# Patient Record
Sex: Male | Born: 1945
Health system: Southern US, Community
[De-identification: ages and names within clinical notes are randomized; demographics above are authoritative.]

## PROBLEM LIST (undated history)

## (undated) DIAGNOSIS — R519 Headache, unspecified: Secondary | ICD-10-CM

## (undated) DIAGNOSIS — I10 Essential (primary) hypertension: Secondary | ICD-10-CM

## (undated) DIAGNOSIS — G4730 Sleep apnea, unspecified: Secondary | ICD-10-CM

## (undated) DIAGNOSIS — E785 Hyperlipidemia, unspecified: Secondary | ICD-10-CM

## (undated) DIAGNOSIS — C801 Malignant (primary) neoplasm, unspecified: Secondary | ICD-10-CM

## (undated) DIAGNOSIS — Z9109 Other allergy status, other than to drugs and biological substances: Secondary | ICD-10-CM

## (undated) DIAGNOSIS — H269 Unspecified cataract: Secondary | ICD-10-CM

## (undated) DIAGNOSIS — K219 Gastro-esophageal reflux disease without esophagitis: Secondary | ICD-10-CM

## (undated) DIAGNOSIS — Z8619 Personal history of other infectious and parasitic diseases: Secondary | ICD-10-CM

## (undated) DIAGNOSIS — Z85828 Personal history of other malignant neoplasm of skin: Secondary | ICD-10-CM

## (undated) DIAGNOSIS — M1990 Unspecified osteoarthritis, unspecified site: Secondary | ICD-10-CM

## (undated) DIAGNOSIS — R51 Headache: Secondary | ICD-10-CM

## (undated) DIAGNOSIS — G4733 Obstructive sleep apnea (adult) (pediatric): Secondary | ICD-10-CM

## (undated) DIAGNOSIS — T7840XA Allergy, unspecified, initial encounter: Secondary | ICD-10-CM

## (undated) DIAGNOSIS — K5900 Constipation, unspecified: Secondary | ICD-10-CM

## (undated) DIAGNOSIS — R351 Nocturia: Secondary | ICD-10-CM

## (undated) HISTORY — PX: EYE SURGERY: SHX253

## (undated) HISTORY — DX: Essential (primary) hypertension: I10

## (undated) HISTORY — DX: Unspecified cataract: H26.9

## (undated) HISTORY — PX: JOINT REPLACEMENT: SHX530

## (undated) HISTORY — DX: Allergy, unspecified, initial encounter: T78.40XA

## (undated) HISTORY — PX: INTERPHALANGEAL JOINT ARTHROPLASTY: SUR71

## (undated) HISTORY — DX: Personal history of other infectious and parasitic diseases: Z86.19

## (undated) HISTORY — DX: Sleep apnea, unspecified: G47.30

## (undated) HISTORY — DX: Obstructive sleep apnea (adult) (pediatric): G47.33

---

## 1998-07-28 ENCOUNTER — Ambulatory Visit: Admission: RE | Admit: 1998-07-28 | Discharge: 1998-07-28 | Payer: Self-pay | Admitting: Family Medicine

## 1998-09-01 ENCOUNTER — Ambulatory Visit: Admission: RE | Admit: 1998-09-01 | Discharge: 1998-09-01 | Payer: Self-pay | Admitting: Internal Medicine

## 2009-01-20 DIAGNOSIS — C4492 Squamous cell carcinoma of skin, unspecified: Secondary | ICD-10-CM

## 2009-01-20 HISTORY — DX: Squamous cell carcinoma of skin, unspecified: C44.92

## 2009-01-21 ENCOUNTER — Ambulatory Visit: Payer: Self-pay | Admitting: Internal Medicine

## 2009-01-21 DIAGNOSIS — G4733 Obstructive sleep apnea (adult) (pediatric): Secondary | ICD-10-CM | POA: Insufficient documentation

## 2009-01-21 DIAGNOSIS — J302 Other seasonal allergic rhinitis: Secondary | ICD-10-CM | POA: Insufficient documentation

## 2009-01-21 DIAGNOSIS — J309 Allergic rhinitis, unspecified: Secondary | ICD-10-CM | POA: Insufficient documentation

## 2010-03-19 ENCOUNTER — Encounter: Admission: RE | Admit: 2010-03-19 | Discharge: 2010-04-23 | Payer: Self-pay | Admitting: Internal Medicine

## 2013-11-30 ENCOUNTER — Ambulatory Visit (HOSPITAL_COMMUNITY): Payer: Medicare Other | Attending: Cardiovascular Disease

## 2013-11-30 ENCOUNTER — Other Ambulatory Visit (HOSPITAL_COMMUNITY): Payer: Self-pay | Admitting: Cardiology

## 2013-11-30 ENCOUNTER — Encounter (HOSPITAL_COMMUNITY): Payer: Self-pay | Admitting: Internal Medicine

## 2013-11-30 DIAGNOSIS — E785 Hyperlipidemia, unspecified: Secondary | ICD-10-CM | POA: Insufficient documentation

## 2013-11-30 DIAGNOSIS — I714 Abdominal aortic aneurysm, without rupture, unspecified: Secondary | ICD-10-CM

## 2013-11-30 DIAGNOSIS — I700 Atherosclerosis of aorta: Secondary | ICD-10-CM | POA: Insufficient documentation

## 2014-10-25 HISTORY — PX: EYE SURGERY: SHX253

## 2014-10-25 HISTORY — PX: CATARACT EXTRACTION W/ INTRAOCULAR LENS IMPLANT: SHX1309

## 2015-06-20 ENCOUNTER — Encounter: Payer: Self-pay | Admitting: Interventional Cardiology

## 2015-06-20 ENCOUNTER — Ambulatory Visit (INDEPENDENT_AMBULATORY_CARE_PROVIDER_SITE_OTHER): Payer: PPO | Admitting: Interventional Cardiology

## 2015-06-20 VITALS — BP 132/80 | HR 75 | Ht 71.0 in | Wt 249.0 lb

## 2015-06-20 DIAGNOSIS — E785 Hyperlipidemia, unspecified: Secondary | ICD-10-CM | POA: Insufficient documentation

## 2015-06-20 DIAGNOSIS — Z01818 Encounter for other preprocedural examination: Secondary | ICD-10-CM | POA: Diagnosis not present

## 2015-06-20 NOTE — Progress Notes (Signed)
Patient ID: Cody [name], male   DOB: Nov 18, 1945, 69 y.o.   MRN: [id number]     Cardiology Office Note   Date:  06/20/2015   ID:  Cody [name] [birth year], MRN [id number]  PCP:  Wenda Low, MD    No chief complaint on file. preop eval for left hip replacement   Wt Readings from Last 3 Encounters:  06/20/15 249 lb (112.946 kg)  01/21/09 246 lb 2.1 oz (111.644 kg)       History of Present Illness: Cody [name] is a 69 y.o. male  Who requires hip replacement.  He had a stress test in 2009 that was normal per his report. Records are unavailable. At that time, he had some atypical chest pain.  THese days, He is not having any chest pain.  He mows the yard with a push mower (90 minutes), walks up stairs and uses an exercise bike twice  aday without any cardiac sx.  No CP or SHOB.    No early CAD in the family.   He had recent cataract surgery without difficulty.      Past Medical History  Diagnosis Date  . History of shingles   . Sleep apnea, obstructive     cpap    Past Surgical History  Procedure Laterality Date  . Eye surgery  2016     Current Outpatient Prescriptions  Medication Sig Dispense Refill  . ALPRAZolam (XANAX) 0.25 MG tablet Take 1/2 tablet by mouth daily as needed for anxiety    . aspirin 81 MG tablet Take 81 mg by mouth daily. Do not take on days when taking ibuprofen.    Marland Kitchen atorvastatin (LIPITOR) 20 MG tablet Take 20 mg by mouth daily.    . Azelastine HCl (ASTEPRO) 0.15 % SOLN Place 1 spray into the nose daily as needed (allergy).     . fluticasone (FLONASE) 50 MCG/ACT nasal spray Place 2 sprays into both nostrils daily.    Marland Kitchen ibuprofen (ADVIL,MOTRIN) 200 MG tablet Take 200 mg by mouth every 6 (six) hours as needed. pain    . omeprazole (PRILOSEC) 20 MG capsule Take 20 mg by mouth every 3 (three) days.    . Pseudoephedrine-Ibuprofen (ADVIL COLD/SINUS PO) Take 1 capsule by mouth daily as needed (cold and sinus).      No current  facility-administered medications for this visit.    Allergies:   Penicillins    Social History:  The patient  reports that he has quit smoking. He does not have any smokeless tobacco history on file. He reports that he drinks alcohol. He reports that he does not use illicit drugs.   Family History:  The patient's family history includes Heart Problems in his mother; Heart failure in his mother; Hypertension in his sister; Other in his sister, son, and son; Stroke in his father.    ROS:  Please see the history of present illness.   Otherwise, review of systems are positive for hip pain.   All other systems are reviewed and negative.    PHYSICAL EXAM: VS:  BP 132/80 mmHg  Pulse 75  Ht 5\' 11"  (1.803 m)  Wt 249 lb (112.946 kg)  BMI 34.74 kg/m2 , BMI Body mass index is 34.74 kg/(m^2). GEN: Well nourished, well developed, in no acute distress HEENT: normal Neck: no JVD, carotid bruits, or masses Cardiac: RRR; no murmurs, rubs, or gallops,no edema  Respiratory:  clear to auscultation bilaterally, normal work of breathing GI: soft, nontender, nondistended, + BS  MS: no deformity or atrophy Skin: warm and dry, no rash Neuro:  Strength and sensation are intact Psych: euthymic mood, full affect   EKG:   The ekg ordered today demonstrates normal ECG, normal sinus rhythm, no ST segment changes   Recent Labs: No results found for requested labs within last 365 days.   Lipid Panel No results found for: CHOL, TRIG, HDL, CHOLHDL, VLDL, LDLCALC, LDLDIRECT   Other studies Reviewed: Additional studies/ records that were reviewed today with results demonstrating: Looked in the [place] record and no stress test results available.   ASSESSMENT AND PLAN:  1. Preoperative cardiovascular assessment: The patient is low to moderate risk for surgery. The surgery itself is a moderate risk procedure. Given his lack of symptoms with fairly vigorous cardiovascular activity, would not plan any further  cardiovascular testing at this time before surgery. His main risk factor is age which obviously cannot be modified. His blood pressure is well controlled.. 2. Hyperlipidemia: Continue atorvastatin. 3. Given his lack of symptoms, we'll not schedule any regular cardiac follow-up. If he has symptoms, he will let us know.   Current medicines are reviewed at length with the patient today.  The patient concerns regarding his medicines were addressed.  The following changes have been made:  No change  Labs/ tests ordered today include:   Orders Placed This Encounter  Procedures  . EKG 12-Lead    Recommend 150 minutes/week of aerobic exercise Low fat, low carb, high fiber diet recommended  Disposition:   FU as needed   Teresita Madura., MD  06/20/2015 11:01 AM    [facility] [address], [place], [place]  [address] Phone: [phone]; Fax: [phone]

## 2015-06-20 NOTE — Patient Instructions (Signed)
Medication Instructions:  Your physician recommends that you continue on your current medications as directed. Please refer to the Current Medication list given to you today.   Labwork: none  Testing/Procedures: none  Follow-Up: Your physician recommends that you schedule a follow-up appointment as needed with Dr. Varanasi   Any Other Special Instructions Will Be Listed Below (If Applicable).   

## 2015-07-16 ENCOUNTER — Ambulatory Visit: Payer: Self-pay | Admitting: Orthopedic Surgery

## 2015-08-18 ENCOUNTER — Ambulatory Visit: Payer: Self-pay | Admitting: Orthopedic Surgery

## 2015-08-18 NOTE — H&P (Signed)
TOTAL HIP ADMISSION H&P  Patient is admitted for left total hip arthroplasty.  Subjective:  Chief Complaint: left hip pain  HPI: Cody [name], 69 y.o. male, has a history of pain and functional disability in the left hip(s) due to arthritis and patient has failed non-surgical conservative treatments for greater than 12 weeks to include NSAID's and/or analgesics, flexibility and strengthening excercises, use of assistive devices, weight reduction as appropriate and activity modification.  Onset of symptoms was gradual starting >10 years ago with gradually worsening course since that time.The patient noted no past surgery on the bilaterally hip(s).  Patient currently rates pain in the left hip at 10 out of 10 with activity. Patient has night pain, worsening of pain with activity and weight bearing, pain that interfers with activities of daily living and pain with passive range of motion. Patient has evidence of subchondral cysts, subchondral sclerosis, periarticular osteophytes and joint space narrowing by imaging studies. This condition presents safety issues increasing the risk of falls.There is no current active infection.  Patient Active Problem List   Diagnosis Date Noted  . Hyperlipidemia 06/20/2015  . SLEEP APNEA, OBSTRUCTIVE 01/21/2009  . RHINOCONJUNCTIVITIS, ALLERGIC 01/21/2009   Past Medical History  Diagnosis Date  . History of shingles   . Sleep apnea, obstructive     cpap    Past Surgical History  Procedure Laterality Date  . Eye surgery  2016     (Not in a hospital admission) Allergies  Allergen Reactions  . Penicillins     unknown    Social History  Substance Use Topics  . Smoking status: Former Research scientist (life sciences)  . Smokeless tobacco: Not on file  . Alcohol Use: 0.0 oz/week    0 Standard drinks or equivalent per week    Family History  Problem Relation Age of Onset  . Heart failure Mother   . Heart Problems Mother     pacer  . Stroke Father   . Hypertension Sister    . Other Sister     healthty  . Other Son     healthy  . Other Son     healthy     Review of Systems  Constitutional: Positive for malaise/fatigue. Negative for fever, chills, weight loss and diaphoresis.  HENT: Positive for tinnitus.   Eyes: Negative.   Respiratory: Negative.   Cardiovascular: Negative.   Gastrointestinal: Positive for heartburn.  Genitourinary: Negative.   Musculoskeletal: Positive for joint pain.  Skin: Negative.   Neurological: Positive for headaches. Negative for dizziness, tingling, tremors, sensory change, speech change, focal weakness, seizures, loss of consciousness and weakness.  Endo/Heme/Allergies: Negative.   Psychiatric/Behavioral: Negative.     Objective:  Physical Exam  Constitutional: He is oriented to person, place, and time. He appears well-developed and well-nourished.  HENT:  Head: Normocephalic and atraumatic.  Eyes: Conjunctivae and EOM are normal. Pupils are equal, round, and reactive to light.  Neck: Normal range of motion. Neck supple.  Cardiovascular: Normal rate, regular rhythm, normal heart sounds and intact distal pulses.   Respiratory: Effort normal and breath sounds normal. No respiratory distress. He has no wheezes.  GI: Soft. Bowel sounds are normal. He exhibits no distension. There is no tenderness.  Genitourinary:  deferred  Musculoskeletal:       Left hip: He exhibits decreased range of motion and bony tenderness.  Neurological: He is alert and oriented to person, place, and time. He has normal reflexes.  Skin: Skin is warm and dry.  Psychiatric: He has a  normal mood and affect. His behavior is normal. Judgment and thought content normal.    Vital signs in last 24 hours: @VSRANGES @  Labs:   Estimated body mass index is 34.74 kg/(m^2) as calculated from the following:   Height as of 06/20/15: 5\' 11"  (1.803 m).   Weight as of 06/20/15: 112.946 kg (249 lb).   Imaging Review Plain radiographs demonstrate severe  degenerative joint disease of the bilateral hip(s). The bone quality appears to be adequate for age and reported activity level.  Assessment/Plan:  End stage arthritis, left hip(s)  The patient history, physical examination, clinical judgement of the provider and imaging studies are consistent with end stage degenerative joint disease of the left hip(s) and total hip arthroplasty is deemed medically necessary. The treatment options including medical management, injection therapy, arthroscopy and arthroplasty were discussed at length. The risks and benefits of total hip arthroplasty were presented and reviewed. The risks due to aseptic loosening, infection, stiffness, dislocation/subluxation,  thromboembolic complications and other imponderables were discussed.  The patient acknowledged the explanation, agreed to proceed with the plan and consent was signed. Patient is being admitted for inpatient treatment for surgery, pain control, PT, OT, prophylactic antibiotics, VTE prophylaxis, progressive ambulation and ADL's and discharge planning.The patient is planning to be discharged home with home health services

## 2015-08-22 NOTE — Patient Instructions (Addendum)
YOUR PROCEDURE IS SCHEDULED ON : 09/04/15  REPORT TO [facility] MAIN ENTRANCE FOLLOW SIGNS TO EAST ELEVATOR - GO TO 3rd FLOOR CHECK IN AT 3 EAST NURSES STATION (SHORT STAY) AT:  10:45 AM  CALL THIS NUMBER IF YOU HAVE PROBLEMS THE MORNING OF SURGERY [phone]  REMEMBER:ONLY 1 PER PERSON MAY GO TO SHORT STAY WITH YOU TO GET READY THE MORNING OF YOUR SURGERY  DO NOT EAT FOOD OR DRINK LIQUIDS AFTER MIDNIGHT  TAKE THESE MEDICINES THE MORNING OF SURGERY: OMEPRAZOLE / MAY USE FLONASE NASAL SPRAY IF NEEDED / USE EYE DROP AS DIRECTED  BRING C-PAP TUBING AND MASK TO HOSPITAL  YOU MAY NOT HAVE ANY METAL ON YOUR BODY INCLUDING HAIR PINS AND PIERCING'S. DO NOT WEAR JEWELRY, MAKEUP, LOTIONS, POWDERS OR PERFUMES. DO NOT WEAR NAIL POLISH. DO NOT SHAVE 48 HRS PRIOR TO SURGERY. MEN MAY SHAVE FACE AND NECK.  DO NOT [place]. [facility] IS NOT RESPONSIBLE FOR VALUABLES.  CONTACTS, DENTURES OR PARTIALS MAY NOT BE WORN TO SURGERY. LEAVE SUITCASE IN CAR. CAN BE BROUGHT TO ROOM AFTER SURGERY.  PATIENTS DISCHARGED THE DAY OF SURGERY WILL NOT BE ALLOWED TO DRIVE HOME.  PLEASE READ OVER THE FOLLOWING INSTRUCTION SHEETS _________________________________________________________________________________                                           - PREPARING FOR SURGERY  Before surgery, you can play an important role.  Because skin is not sterile, your skin needs to be as free of germs as possible.  You can reduce the number of germs on your skin by washing with CHG (chlorahexidine gluconate) soap before surgery.  CHG is an antiseptic cleaner which kills germs and bonds with the skin to continue killing germs even after washing. Please DO NOT use if you have an allergy to CHG or antibacterial soaps.  If your skin becomes reddened/irritated stop using the CHG and inform your nurse when you arrive at Short Stay. Do not shave (including legs and underarms) for at  least 48 hours prior to the first CHG shower.  You may shave your face. Please follow these instructions carefully:   1.  Shower with CHG Soap the night before surgery and the  morning of Surgery.   2.  If you choose to wash your hair, wash your hair first as usual with your  normal  Shampoo.   3.  After you shampoo, rinse your hair and body thoroughly to remove the  shampoo.                                         4.  Use CHG as you would any other liquid soap.  You can apply chg directly  to the skin and wash . Gently wash with scrungie or clean wascloth    5.  Apply the CHG Soap to your body ONLY FROM THE NECK DOWN.   Do not use on open                           Wound or open sores. Avoid contact with eyes, ears mouth and genitals (private parts).  Genitals (private parts) with your normal soap.              6.  Wash thoroughly, paying special attention to the area where your surgery  will be performed.   7.  Thoroughly rinse your body with warm water from the neck down.   8.  DO NOT shower/wash with your normal soap after using and rinsing off  the CHG Soap .                9.  Pat yourself dry with a clean towel.             10.  Wear clean night clothes to bed after shower             11.  Place clean sheets on your bed the night of your first shower and do not  sleep with pets.  Day of Surgery : Do not apply any lotions/deodorants the morning of surgery.  Please wear clean clothes to the hospital/surgery center.  FAILURE TO FOLLOW THESE INSTRUCTIONS MAY RESULT IN THE CANCELLATION OF YOUR SURGERY    PATIENT SIGNATURE_________________________________  ____________________________________________________________________    Cody [name]  An incentive spirometer is a tool that can help keep your lungs clear and active. This tool measures how well you are filling your lungs with each breath. Taking long deep breaths may help reverse or  decrease the chance of developing breathing (pulmonary) problems (especially infection) following:  A long period of time when you are unable to move or be active. BEFORE THE PROCEDURE   If the spirometer includes an indicator to show your best effort, your nurse or respiratory therapist will set it to a desired goal.  If possible, sit up straight or lean slightly forward. Try not to slouch.  Hold the incentive spirometer in an upright position. INSTRUCTIONS FOR USE   Sit on the edge of your bed if possible, or sit up as far as you can in bed or on a chair.  Hold the incentive spirometer in an upright position.  Breathe out normally.  Place the mouthpiece in your mouth and seal your lips tightly around it.  Breathe in slowly and as deeply as possible, raising the piston or the ball toward the top of the column.  Hold your breath for 3-5 seconds or for as long as possible. Allow the piston or ball to fall to the bottom of the column.  Remove the mouthpiece from your mouth and breathe out normally.  Rest for a few seconds and repeat Steps 1 through 7 at least 10 times every 1-2 hours when you are awake. Take your time and take a few normal breaths between deep breaths.  The spirometer may include an indicator to show your best effort. Use the indicator as a goal to work toward during each repetition.  After each set of 10 deep breaths, practice coughing to be sure your lungs are clear. If you have an incision (the cut made at the time of surgery), support your incision when coughing by placing a pillow or rolled up towels firmly against it. Once you are able to get out of bed, walk around indoors and cough well. You may stop using the incentive spirometer when instructed by your caregiver.  RISKS AND COMPLICATIONS  Take your time so you do not get dizzy or light-headed.  If you are in pain, you may need to take or ask for pain medication before doing incentive spirometry. It is  harder to take a deep breath if you are having pain. AFTER USE  Rest and breathe slowly and easily.  It can be helpful to keep track of a log of your progress. Your caregiver can provide you with a simple table to help with this. If you are using the spirometer at home, follow these instructions: [place] IF:   You are having difficultly using the spirometer.  You have trouble using the spirometer as often as instructed.  Your pain medication is not giving enough relief while using the spirometer.  You develop fever of 100.5 F (38.1 C) or higher. SEEK IMMEDIATE MEDICAL CARE IF:   You cough up bloody sputum that had not been present before.  You develop fever of 102 F (38.9 C) or greater.  You develop worsening pain at or near the incision site. MAKE SURE YOU:   Understand these instructions.  Will watch your condition.  Will get help right away if you are not doing well or get worse. Document Released: 02/21/2007 Document Revised: 01/03/2012 Document Reviewed: 04/24/2007 Exit[employer] Patient Information 2014 [employer], [place].   ________________________________________________________________________  WHAT IS A BLOOD TRANSFUSION? Blood Transfusion Information  A transfusion is the replacement of blood or some of its parts. Blood is made up of multiple cells which provide different functions.  Red blood cells carry oxygen and are used for blood loss replacement.  White blood cells fight against infection.  Platelets control bleeding.  Plasma helps clot blood.  Other blood products are available for specialized needs, such as hemophilia or other clotting disorders. BEFORE THE TRANSFUSION  Who gives blood for transfusions?   Healthy volunteers who are fully evaluated to make sure their blood is safe. This is blood bank blood. Transfusion therapy is the safest it has ever been in the practice of medicine. Before blood is taken from a donor, a complete history  is taken to make sure that person has no history of diseases nor engages in risky social behavior (examples are intravenous drug use or sexual activity with multiple partners). The donor's travel history is screened to minimize risk of transmitting infections, such as malaria. The donated blood is tested for signs of infectious diseases, such as HIV and hepatitis. The blood is then tested to be sure it is compatible with you in order to minimize the chance of a transfusion reaction. If you or a relative donates blood, this is often done in anticipation of surgery and is not appropriate for emergency situations. It takes many days to process the donated blood. RISKS AND COMPLICATIONS Although transfusion therapy is very safe and saves many lives, the main dangers of transfusion include:   Getting an infectious disease.  Developing a transfusion reaction. This is an allergic reaction to something in the blood you were given. Every precaution is taken to prevent this. The decision to have a blood transfusion has been considered carefully by your caregiver before blood is given. Blood is not given unless the benefits outweigh the risks. AFTER THE TRANSFUSION  Right after receiving a blood transfusion, you will usually feel much better and more energetic. This is especially true if your red blood cells have gotten low (anemic). The transfusion raises the level of the red blood cells which carry oxygen, and this usually causes an energy increase.  The nurse administering the transfusion will monitor you carefully for complications. HOME CARE INSTRUCTIONS  No special instructions are needed after a transfusion. You may find your energy is better. Speak with  your caregiver about any limitations on activity for underlying diseases you may have. SEEK MEDICAL CARE IF:   Your condition is not improving after your transfusion.  You develop redness or irritation at the intravenous (IV) site. SEEK IMMEDIATE  MEDICAL CARE IF:  Any of the following symptoms occur over the next 12 hours:  Shaking chills.  You have a temperature by mouth above 102 F (38.9 C), not controlled by medicine.  Chest, back, or muscle pain.  People around you feel you are not acting correctly or are confused.  Shortness of breath or difficulty breathing.  Dizziness and fainting.  You get a rash or develop hives.  You have a decrease in urine output.  Your urine turns a dark color or changes to pink, red, or brown. Any of the following symptoms occur over the next 10 days:  You have a temperature by mouth above 102 F (38.9 C), not controlled by medicine.  Shortness of breath.  Weakness after normal activity.  The white part of the eye turns yellow (jaundice).  You have a decrease in the amount of urine or are urinating less often.  Your urine turns a dark color or changes to pink, red, or brown. Document Released: 10/08/2000 Document Revised: 01/03/2012 Document Reviewed: 05/27/2008 [facility] Patient Information 2014 [place], [place].  _______________________________________________________________________

## 2015-08-25 ENCOUNTER — Encounter (HOSPITAL_COMMUNITY): Payer: Self-pay

## 2015-08-25 ENCOUNTER — Encounter (HOSPITAL_COMMUNITY)
Admission: RE | Admit: 2015-08-25 | Discharge: 2015-08-25 | Disposition: A | Discharge status: Home / Self Care | Payer: PPO | Source: Ambulatory Visit | Attending: Orthopedic Surgery | Admitting: Orthopedic Surgery

## 2015-08-25 DIAGNOSIS — Z01818 Encounter for other preprocedural examination: Secondary | ICD-10-CM | POA: Diagnosis not present

## 2015-08-25 DIAGNOSIS — M1612 Unilateral primary osteoarthritis, left hip: Secondary | ICD-10-CM | POA: Diagnosis not present

## 2015-08-25 HISTORY — DX: Constipation, unspecified: K59.00

## 2015-08-25 HISTORY — DX: Other allergy status, other than to drugs and biological substances: Z91.09

## 2015-08-25 HISTORY — DX: Hyperlipidemia, unspecified: E78.5

## 2015-08-25 HISTORY — DX: Unspecified osteoarthritis, unspecified site: M19.90

## 2015-08-25 HISTORY — DX: Nocturia: R35.1

## 2015-08-25 HISTORY — DX: Gastro-esophageal reflux disease without esophagitis: K21.9

## 2015-08-25 HISTORY — DX: Personal history of other malignant neoplasm of skin: Z85.828

## 2015-08-25 HISTORY — DX: Headache, unspecified: R51.9

## 2015-08-25 HISTORY — DX: Headache: R51

## 2015-08-25 LAB — COMPREHENSIVE METABOLIC PANEL
ALBUMIN: 4.7 g/dL (ref 3.5–5.0)
ALT: 22 U/L (ref 17–63)
AST: 24 U/L (ref 15–41)
Alkaline Phosphatase: 72 U/L (ref 38–126)
Anion gap: 8 (ref 5–15)
BUN: 17 mg/dL (ref 6–20)
CO2: 25 mmol/L (ref 22–32)
Calcium: 9.8 mg/dL (ref 8.9–10.3)
Chloride: 104 mmol/L (ref 101–111)
Creatinine, Ser: 1.21 mg/dL (ref 0.61–1.24)
GFR calc Af Amer: >60 mL/min (ref >60)
GFR calc non Af Amer: >60 mL/min (ref >60)
GLUCOSE: 107 mg/dL — AB (ref 65–99)
POTASSIUM: 4 mmol/L (ref 3.5–5.1)
SODIUM: 137 mmol/L (ref 135–145)
TOTAL PROTEIN: 7.8 g/dL (ref 6.5–8.1)
Total Bilirubin: 1.2 mg/dL (ref 0.3–1.2)

## 2015-08-25 LAB — URINALYSIS, ROUTINE W REFLEX MICROSCOPIC
Bilirubin Urine: NEGATIVE
GLUCOSE, UA: NEGATIVE mg/dL
KETONES UR: NEGATIVE mg/dL
Leukocytes, UA: NEGATIVE
Nitrite: NEGATIVE
PROTEIN: NEGATIVE mg/dL
Specific Gravity, Urine: 1.013 (ref 1.005–1.030)
Urobilinogen, UA: 0.2 mg/dL (ref 0.0–1.0)
pH: 5.5 (ref 5.0–8.0)

## 2015-08-25 LAB — PROTIME-INR
INR: 1.06 (ref 0.00–1.49)
Prothrombin Time: 14 seconds (ref 11.6–15.2)

## 2015-08-25 LAB — CBC
HCT: 45.9 % (ref 39.0–52.0)
Hemoglobin: 16 g/dL (ref 13.0–17.0)
MCH: 29.9 pg (ref 26.0–34.0)
MCHC: 34.9 g/dL (ref 30.0–36.0)
MCV: 85.6 fL (ref 78.0–100.0)
PLATELETS: 211 10*3/uL (ref 150–400)
RBC: 5.36 MIL/uL (ref 4.22–5.81)
RDW: 13.2 % (ref 11.5–15.5)
WBC: 6.7 10*3/uL (ref 4.0–10.5)

## 2015-08-25 LAB — ABO/RH: ABO/RH(D): B POS

## 2015-08-25 LAB — APTT: APTT: 29 s (ref 24–37)

## 2015-08-25 LAB — URINE MICROSCOPIC-ADD ON

## 2015-08-25 LAB — SURGICAL PCR SCREEN
MRSA, PCR: INVALID — AB
STAPHYLOCOCCUS AUREUS: INVALID — AB

## 2015-08-27 LAB — MRSA CULTURE

## 2015-09-04 ENCOUNTER — Inpatient Hospital Stay (HOSPITAL_COMMUNITY)
Admission: RE | Admit: 2015-09-04 | Discharge: 2015-09-06 | DRG: 470 | Disposition: A | Discharge status: Home Health | Payer: PPO | Source: Ambulatory Visit | Attending: Orthopedic Surgery | Admitting: Orthopedic Surgery

## 2015-09-04 ENCOUNTER — Inpatient Hospital Stay (HOSPITAL_COMMUNITY): Payer: PPO | Admitting: Registered Nurse

## 2015-09-04 ENCOUNTER — Encounter (HOSPITAL_COMMUNITY)
Admission: RE | Disposition: A | Discharge status: Home Health | Payer: Self-pay | Source: Ambulatory Visit | Attending: Orthopedic Surgery

## 2015-09-04 ENCOUNTER — Encounter (HOSPITAL_COMMUNITY): Payer: Self-pay | Admitting: Registered Nurse

## 2015-09-04 ENCOUNTER — Inpatient Hospital Stay (HOSPITAL_COMMUNITY): Payer: PPO

## 2015-09-04 DIAGNOSIS — Z419 Encounter for procedure for purposes other than remedying health state, unspecified: Secondary | ICD-10-CM

## 2015-09-04 DIAGNOSIS — E785 Hyperlipidemia, unspecified: Secondary | ICD-10-CM | POA: Diagnosis present

## 2015-09-04 DIAGNOSIS — G4733 Obstructive sleep apnea (adult) (pediatric): Secondary | ICD-10-CM | POA: Diagnosis present

## 2015-09-04 DIAGNOSIS — M1612 Unilateral primary osteoarthritis, left hip: Principal | ICD-10-CM | POA: Diagnosis present

## 2015-09-04 DIAGNOSIS — Z79899 Other long term (current) drug therapy: Secondary | ICD-10-CM

## 2015-09-04 DIAGNOSIS — M25552 Pain in left hip: Secondary | ICD-10-CM | POA: Diagnosis present

## 2015-09-04 DIAGNOSIS — Z09 Encounter for follow-up examination after completed treatment for conditions other than malignant neoplasm: Secondary | ICD-10-CM

## 2015-09-04 DIAGNOSIS — K219 Gastro-esophageal reflux disease without esophagitis: Secondary | ICD-10-CM | POA: Diagnosis present

## 2015-09-04 DIAGNOSIS — Z01812 Encounter for preprocedural laboratory examination: Secondary | ICD-10-CM

## 2015-09-04 DIAGNOSIS — Z7982 Long term (current) use of aspirin: Secondary | ICD-10-CM

## 2015-09-04 DIAGNOSIS — Z87891 Personal history of nicotine dependence: Secondary | ICD-10-CM

## 2015-09-04 DIAGNOSIS — Z8619 Personal history of other infectious and parasitic diseases: Secondary | ICD-10-CM

## 2015-09-04 DIAGNOSIS — Z85828 Personal history of other malignant neoplasm of skin: Secondary | ICD-10-CM | POA: Diagnosis not present

## 2015-09-04 HISTORY — PX: TOTAL HIP ARTHROPLASTY: SHX124

## 2015-09-04 LAB — TYPE AND SCREEN
ABO/RH(D): B POS
ANTIBODY SCREEN: NEGATIVE

## 2015-09-04 SURGERY — ARTHROPLASTY, HIP, TOTAL, ANTERIOR APPROACH
Anesthesia: Spinal | Site: Hip | Laterality: Left

## 2015-09-04 MED ORDER — KETOROLAC TROMETHAMINE 30 MG/ML IJ SOLN
INTRAMUSCULAR | Status: AC
  Filled 2015-09-04: qty 1

## 2015-09-04 MED ORDER — CEFAZOLIN SODIUM-DEXTROSE 2-3 GM-% IV SOLR
INTRAVENOUS | Status: AC
  Filled 2015-09-04: qty 50

## 2015-09-04 MED ORDER — CEFAZOLIN SODIUM-DEXTROSE 2-3 GM-% IV SOLR
2.0000 g | Freq: Four times a day (QID) | INTRAVENOUS | Status: AC
  Administered 2015-09-04 – 2015-09-05 (×2): 2 g via INTRAVENOUS
  Filled 2015-09-04 (×2): qty 50

## 2015-09-04 MED ORDER — LACTATED RINGERS IV SOLN
INTRAVENOUS | Status: DC
  Administered 2015-09-04: 15:00:00 via INTRAVENOUS
  Administered 2015-09-04: 1000 mL via INTRAVENOUS

## 2015-09-04 MED ORDER — FENTANYL CITRATE (PF) 100 MCG/2ML IJ SOLN
INTRAMUSCULAR | Status: DC | PRN
  Administered 2015-09-04: 100 ug via INTRAVENOUS

## 2015-09-04 MED ORDER — ACETAMINOPHEN 650 MG RE SUPP: 650.0000 mg | Freq: Four times a day (QID) | RECTAL | Status: DC | PRN

## 2015-09-04 MED ORDER — SODIUM CHLORIDE 0.9 % IV SOLN
INTRAVENOUS | Status: DC
  Administered 2015-09-04: 20:00:00 via INTRAVENOUS
  Administered 2015-09-04: 1000 mL via INTRAVENOUS
  Administered 2015-09-05: 04:00:00 via INTRAVENOUS

## 2015-09-04 MED ORDER — BUPIVACAINE-EPINEPHRINE (PF) 0.25% -1:200000 IJ SOLN
INTRAMUSCULAR | Status: AC
  Filled 2015-09-04: qty 30

## 2015-09-04 MED ORDER — HYDROGEN PEROXIDE 3 % EX SOLN
CUTANEOUS | Status: DC | PRN
  Administered 2015-09-04: 1

## 2015-09-04 MED ORDER — ONDANSETRON HCL 4 MG/2ML IJ SOLN
4.0000 mg | Freq: Four times a day (QID) | INTRAMUSCULAR | Status: DC | PRN
  Administered 2015-09-05: 4 mg via INTRAVENOUS
  Filled 2015-09-04: qty 2

## 2015-09-04 MED ORDER — LIDOCAINE HCL (CARDIAC) 20 MG/ML IV SOLN
INTRAVENOUS | Status: DC | PRN
  Administered 2015-09-04: 50 mg via INTRAVENOUS

## 2015-09-04 MED ORDER — PANTOPRAZOLE SODIUM 40 MG PO TBEC
40.0000 mg | DELAYED_RELEASE_TABLET | Freq: Every day | ORAL | Status: DC
  Administered 2015-09-05 – 2015-09-06 (×2): 40 mg via ORAL
  Filled 2015-09-04 (×2): qty 1

## 2015-09-04 MED ORDER — METOCLOPRAMIDE HCL 5 MG/ML IJ SOLN: 5.0000 mg | Freq: Three times a day (TID) | INTRAMUSCULAR | Status: DC | PRN

## 2015-09-04 MED ORDER — KETOROLAC TROMETHAMINE 30 MG/ML IJ SOLN
INTRAMUSCULAR | Status: DC | PRN
  Administered 2015-09-04: 30 mg

## 2015-09-04 MED ORDER — FENTANYL CITRATE (PF) 100 MCG/2ML IJ SOLN
INTRAMUSCULAR | Status: AC
  Filled 2015-09-04: qty 2

## 2015-09-04 MED ORDER — FENTANYL CITRATE (PF) 100 MCG/2ML IJ SOLN
25.0000 ug | INTRAMUSCULAR | Status: DC | PRN
  Administered 2015-09-04: 50 ug via INTRAVENOUS

## 2015-09-04 MED ORDER — WATER FOR IRRIGATION, STERILE IR SOLN
Status: DC | PRN
  Administered 2015-09-04: 1000 mL

## 2015-09-04 MED ORDER — CHLORHEXIDINE GLUCONATE 4 % EX LIQD: 60.0000 mL | Freq: Once | CUTANEOUS | Status: DC

## 2015-09-04 MED ORDER — DOCUSATE SODIUM 100 MG PO CAPS
100.0000 mg | ORAL_CAPSULE | Freq: Two times a day (BID) | ORAL | Status: DC
  Administered 2015-09-04 – 2015-09-06 (×4): 100 mg via ORAL

## 2015-09-04 MED ORDER — HYDROCODONE-ACETAMINOPHEN 5-325 MG PO TABS
1.0000 | ORAL_TABLET | ORAL | Status: DC | PRN
  Administered 2015-09-04 (×2): 2 via ORAL
  Administered 2015-09-05: 1 via ORAL
  Administered 2015-09-05 (×3): 2 via ORAL
  Administered 2015-09-05 – 2015-09-06 (×3): 1 via ORAL
  Filled 2015-09-04 (×3): qty 1
  Filled 2015-09-04 (×3): qty 2
  Filled 2015-09-04: qty 1
  Filled 2015-09-04 (×2): qty 2

## 2015-09-04 MED ORDER — PROPOFOL 10 MG/ML IV BOLUS
INTRAVENOUS | Status: AC
  Filled 2015-09-04: qty 20

## 2015-09-04 MED ORDER — MENTHOL 3 MG MT LOZG: 1.0000 | LOZENGE | OROMUCOSAL | Status: DC | PRN

## 2015-09-04 MED ORDER — SODIUM CHLORIDE 0.9 % IJ SOLN
INTRAMUSCULAR | Status: DC | PRN
  Administered 2015-09-04: 30 mL

## 2015-09-04 MED ORDER — ONDANSETRON HCL 4 MG/2ML IJ SOLN
INTRAMUSCULAR | Status: AC
  Filled 2015-09-04: qty 2

## 2015-09-04 MED ORDER — POVIDONE-IODINE 10 % EX SOLN
CUTANEOUS | Status: DC | PRN
  Administered 2015-09-04: 1 via TOPICAL

## 2015-09-04 MED ORDER — MIDAZOLAM HCL 2 MG/2ML IJ SOLN
INTRAMUSCULAR | Status: AC
  Filled 2015-09-04: qty 4

## 2015-09-04 MED ORDER — DEXAMETHASONE SODIUM PHOSPHATE 10 MG/ML IJ SOLN
10.0000 mg | Freq: Once | INTRAMUSCULAR | Status: AC
  Administered 2015-09-05: 10 mg via INTRAVENOUS
  Filled 2015-09-04: qty 1

## 2015-09-04 MED ORDER — PHENOL 1.4 % MT LIQD: 1.0000 | OROMUCOSAL | Status: DC | PRN

## 2015-09-04 MED ORDER — KETOROLAC TROMETHAMINE 0.5 % OP SOLN
1.0000 [drp] | Freq: Four times a day (QID) | OPHTHALMIC | Status: DC
  Administered 2015-09-04 – 2015-09-06 (×6): 1 [drp] via OPHTHALMIC
  Filled 2015-09-04: qty 3

## 2015-09-04 MED ORDER — GLYCOPYRROLATE 0.2 MG/ML IJ SOLN
INTRAMUSCULAR | Status: DC | PRN
  Administered 2015-09-04: 0.2 mg via INTRAVENOUS

## 2015-09-04 MED ORDER — ASPIRIN EC 325 MG PO TBEC
325.0000 mg | DELAYED_RELEASE_TABLET | Freq: Two times a day (BID) | ORAL | Status: DC
  Administered 2015-09-05 – 2015-09-06 (×3): 325 mg via ORAL
  Filled 2015-09-04 (×5): qty 1

## 2015-09-04 MED ORDER — OFLOXACIN 0.3 % OP SOLN
1.0000 [drp] | Freq: Four times a day (QID) | OPHTHALMIC | Status: DC
  Administered 2015-09-04 – 2015-09-06 (×7): 1 [drp] via OPHTHALMIC
  Filled 2015-09-04: qty 5

## 2015-09-04 MED ORDER — SODIUM CHLORIDE 0.9 % IJ SOLN
INTRAMUSCULAR | Status: AC
  Filled 2015-09-04: qty 50

## 2015-09-04 MED ORDER — FENTANYL CITRATE (PF) 100 MCG/2ML IJ SOLN
INTRAMUSCULAR | Status: AC
Start: 2015-09-04 — End: 2015-09-04
  Filled 2015-09-04: qty 4

## 2015-09-04 MED ORDER — HYDROMORPHONE HCL 1 MG/ML IJ SOLN
0.5000 mg | INTRAMUSCULAR | Status: DC | PRN
Start: 2015-09-04 — End: 2015-09-06

## 2015-09-04 MED ORDER — HYDROGEN PEROXIDE 3 % EX SOLN
CUTANEOUS | Status: AC
  Filled 2015-09-04: qty 473

## 2015-09-04 MED ORDER — BUPIVACAINE-EPINEPHRINE 0.25% -1:200000 IJ SOLN
INTRAMUSCULAR | Status: DC | PRN
  Administered 2015-09-04: 30 mL

## 2015-09-04 MED ORDER — PROPOFOL 10 MG/ML IV BOLUS
INTRAVENOUS | Status: DC | PRN
  Administered 2015-09-04 (×2): 30 mg via INTRAVENOUS

## 2015-09-04 MED ORDER — ONDANSETRON HCL 4 MG/2ML IJ SOLN
INTRAMUSCULAR | Status: DC | PRN
  Administered 2015-09-04: 4 mg via INTRAVENOUS

## 2015-09-04 MED ORDER — SODIUM CHLORIDE 0.9 % IV SOLN: INTRAVENOUS | Status: DC

## 2015-09-04 MED ORDER — METHOCARBAMOL 1000 MG/10ML IJ SOLN
500.0000 mg | Freq: Four times a day (QID) | INTRAVENOUS | Status: DC | PRN
  Administered 2015-09-04: 500 mg via INTRAVENOUS
  Filled 2015-09-04 (×2): qty 5

## 2015-09-04 MED ORDER — PROMETHAZINE HCL 25 MG/ML IJ SOLN: 6.2500 mg | INTRAMUSCULAR | Status: DC | PRN

## 2015-09-04 MED ORDER — PROPOFOL 500 MG/50ML IV EMUL
INTRAVENOUS | Status: DC | PRN
  Administered 2015-09-04: 1 ug/kg/min via INTRAVENOUS

## 2015-09-04 MED ORDER — TRANEXAMIC ACID 1000 MG/10ML IV SOLN
1000.0000 mg | INTRAVENOUS | Status: AC
  Administered 2015-09-04: 1000 mg via INTRAVENOUS
  Filled 2015-09-04: qty 10

## 2015-09-04 MED ORDER — CEFAZOLIN SODIUM-DEXTROSE 2-3 GM-% IV SOLR
2.0000 g | INTRAVENOUS | Status: AC
  Administered 2015-09-04: 2 g via INTRAVENOUS

## 2015-09-04 MED ORDER — ATORVASTATIN CALCIUM 20 MG PO TABS
20.0000 mg | ORAL_TABLET | Freq: Every day | ORAL | Status: DC
  Administered 2015-09-04 – 2015-09-05 (×2): 20 mg via ORAL
  Filled 2015-09-04 (×3): qty 1

## 2015-09-04 MED ORDER — SODIUM CHLORIDE 0.9 % IR SOLN
Status: DC | PRN
  Administered 2015-09-04: 3000 mL

## 2015-09-04 MED ORDER — METOCLOPRAMIDE HCL 10 MG PO TABS: 5.0000 mg | ORAL_TABLET | Freq: Three times a day (TID) | ORAL | Status: DC | PRN

## 2015-09-04 MED ORDER — ONDANSETRON HCL 4 MG PO TABS: 4.0000 mg | ORAL_TABLET | Freq: Four times a day (QID) | ORAL | Status: DC | PRN

## 2015-09-04 MED ORDER — KETOROLAC TROMETHAMINE 15 MG/ML IJ SOLN
INTRAMUSCULAR | Status: AC
  Filled 2015-09-04: qty 1

## 2015-09-04 MED ORDER — DEXAMETHASONE SODIUM PHOSPHATE 10 MG/ML IJ SOLN
INTRAMUSCULAR | Status: AC
  Filled 2015-09-04: qty 1

## 2015-09-04 MED ORDER — KETOROLAC TROMETHAMINE 15 MG/ML IJ SOLN
7.5000 mg | Freq: Four times a day (QID) | INTRAMUSCULAR | Status: AC
  Administered 2015-09-04 – 2015-09-05 (×4): 7.5 mg via INTRAVENOUS
  Filled 2015-09-04 (×4): qty 1

## 2015-09-04 MED ORDER — METHOCARBAMOL 500 MG PO TABS: 500.0000 mg | ORAL_TABLET | Freq: Four times a day (QID) | ORAL | Status: DC | PRN

## 2015-09-04 MED ORDER — ISOPROPYL ALCOHOL 70 % SOLN
Status: DC | PRN
  Administered 2015-09-04: 1 via TOPICAL

## 2015-09-04 MED ORDER — ISOPROPYL ALCOHOL 70 % SOLN
Status: AC
  Filled 2015-09-04: qty 480

## 2015-09-04 MED ORDER — ACETAMINOPHEN 10 MG/ML IV SOLN
INTRAVENOUS | Status: AC
  Filled 2015-09-04: qty 100

## 2015-09-04 MED ORDER — SENNA 8.6 MG PO TABS
2.0000 | ORAL_TABLET | Freq: Every day | ORAL | Status: DC
  Administered 2015-09-04 – 2015-09-05 (×2): 17.2 mg via ORAL

## 2015-09-04 MED ORDER — DIFLUPREDNATE 0.05 % OP EMUL
1.0000 [drp] | Freq: Four times a day (QID) | OPHTHALMIC | Status: DC
  Administered 2015-09-05 – 2015-09-06 (×3): 1 [drp] via OPHTHALMIC

## 2015-09-04 MED ORDER — BUPIVACAINE HCL (PF) 0.5 % IJ SOLN
INTRAMUSCULAR | Status: DC | PRN
  Administered 2015-09-04: 3 mL via INTRATHECAL

## 2015-09-04 MED ORDER — MIDAZOLAM HCL 5 MG/5ML IJ SOLN
INTRAMUSCULAR | Status: DC | PRN
  Administered 2015-09-04: 2 mg via INTRAVENOUS

## 2015-09-04 MED ORDER — ONDANSETRON HCL 4 MG/2ML IJ SOLN
INTRAMUSCULAR | Status: AC
Start: 2015-09-04 — End: 2015-09-04
  Filled 2015-09-04: qty 2

## 2015-09-04 MED ORDER — ACETAMINOPHEN 325 MG PO TABS: 650.0000 mg | ORAL_TABLET | Freq: Four times a day (QID) | ORAL | Status: DC | PRN

## 2015-09-04 SURGICAL SUPPLY — 40 items
BAG DECANTER FOR FLEXI CONT (MISCELLANEOUS) IMPLANT
BAG SPEC THK2 15X12 ZIP CLS (MISCELLANEOUS) ×1
BAG ZIPLOCK 12X15 (MISCELLANEOUS) ×2 IMPLANT
CAPT HIP TOTAL 2 ×2 IMPLANT
CHLORAPREP W/TINT 26ML (MISCELLANEOUS) ×3 IMPLANT
COVER PERINEAL POST (MISCELLANEOUS) ×3 IMPLANT
DECANTER SPIKE VIAL GLASS SM (MISCELLANEOUS) ×3 IMPLANT
DRAPE LG THREE QUARTER DISP (DRAPES) ×8 IMPLANT
DRAPE STERI IOBAN 125X83 (DRAPES) ×3 IMPLANT
DRAPE U-SHAPE 47X51 STRL (DRAPES) ×6 IMPLANT
DRSG AQUACEL AG ADV 3.5X10 (GAUZE/BANDAGES/DRESSINGS) ×3 IMPLANT
ELECT REM PT RETURN 15FT ADLT (MISCELLANEOUS) ×3 IMPLANT
GAUZE SPONGE 4X4 12PLY STRL (GAUZE/BANDAGES/DRESSINGS) ×3 IMPLANT
GLOVE BIO SURGEON STRL SZ8.5 (GLOVE) ×6 IMPLANT
GLOVE BIOGEL PI IND STRL 8.5 (GLOVE) ×1 IMPLANT
GLOVE BIOGEL PI INDICATOR 8.5 (GLOVE) ×2
GOWN SPEC L3 XXLG W/TWL (GOWN DISPOSABLE) ×6 IMPLANT
HANDPIECE INTERPULSE COAX TIP (DISPOSABLE) ×3
HOLDER FOLEY CATH W/STRAP (MISCELLANEOUS) ×3 IMPLANT
HOOD PEEL AWAY FACE SHEILD DIS (HOOD) ×6 IMPLANT
LIQUID BAND (GAUZE/BANDAGES/DRESSINGS) ×4 IMPLANT
NDL SPNL 18GX3.5 QUINCKE PK (NEEDLE) ×1 IMPLANT
NEEDLE SPNL 18GX3.5 QUINCKE PK (NEEDLE) ×3 IMPLANT
PACK ANTERIOR HIP CUSTOM (KITS) ×3 IMPLANT
SAW OSC TIP CART 19.5X105X1.3 (SAW) ×3 IMPLANT
SEALER BIPOLAR AQUA 6.0 (INSTRUMENTS) ×3 IMPLANT
SET HNDPC FAN SPRY TIP SCT (DISPOSABLE) ×1 IMPLANT
SOL PREP POV-IOD 4OZ 10% (MISCELLANEOUS) ×3 IMPLANT
SUT ETHIBOND NAB CT1 #1 30IN (SUTURE) ×6 IMPLANT
SUT MNCRL AB 3-0 PS2 18 (SUTURE) ×3 IMPLANT
SUT MON AB 2-0 CT1 36 (SUTURE) ×6 IMPLANT
SUT VIC AB 1 CT1 36 (SUTURE) ×3 IMPLANT
SUT VIC AB 2-0 CT1 27 (SUTURE) ×3
SUT VIC AB 2-0 CT1 TAPERPNT 27 (SUTURE) ×1 IMPLANT
SUT VLOC 180 0 24IN GS25 (SUTURE) ×3 IMPLANT
SYR 50ML LL SCALE MARK (SYRINGE) ×3 IMPLANT
TRAY FOLEY W/METER SILVER 14FR (SET/KITS/TRAYS/PACK) ×1 IMPLANT
TRAY FOLEY W/METER SILVER 16FR (SET/KITS/TRAYS/PACK) ×3 IMPLANT
WATER STERILE IRR 1500ML POUR (IV SOLUTION) ×3 IMPLANT
YANKAUER SUCT BULB TIP 10FT TU (MISCELLANEOUS) ×3 IMPLANT

## 2015-09-04 NOTE — Progress Notes (Signed)
Pt stated he is not ready for bed yet, but asked that I setup cpap at home pressure of 11cm h2o.  Pt brought in his tubing and nasal pillows from home, and stated he will place the cpap on himself when ready for bed.  Pt stated he has wore it for many years and feels comfortable doing this himself.  Sterile water added to humidifier.  Pt was advised that RT is available all night should he need further assistance.

## 2015-09-04 NOTE — Discharge Summary (Signed)
Physician Discharge Summary  Patient ID: Cody [name] MRN: [id number] DOB/AGE: 1945/12/21 69 y.o.  Admit date: 09/04/2015 Discharge date: 09/06/2015  Admission Diagnoses:  Osteoarthritis of left hip  Discharge Diagnoses:  Principal Problem:   Osteoarthritis of left hip   Past Medical History  Diagnosis Date  . History of shingles   . Sleep apnea, obstructive     cpap  . Hyperlipidemia   . Environmental allergies   . Headache     "bad sinus headache"  . Arthritis   . History of skin cancer   . GERD (gastroesophageal reflux disease)   . Nocturia   . Constipation   . Anxiety     Surgeries: Procedure(s): LEFT TOTAL HIP ARTHROPLASTY ANTERIOR APPROACH on 09/04/2015   Consultants (if any):    Discharged Condition: Improved  Hospital Course: Cody [name] is an 69 y.o. male who was admitted 09/04/2015 with a diagnosis of Osteoarthritis of left hip and went to the operating room on 09/04/2015 and underwent the above named procedures.    He was given perioperative antibiotics:      Anti-infectives    Start     Dose/Rate Route Frequency Ordered Stop   09/04/15 2000  ceFAZolin (ANCEF) IVPB 2 g/50 mL premix     2 g 100 mL/hr over 30 Minutes Intravenous Every 6 hours 09/04/15 1747 09/05/15 0312   09/04/15 1114  ceFAZolin (ANCEF) IVPB 2 g/50 mL premix     2 g 100 mL/hr over 30 Minutes Intravenous On call to O.R. 09/04/15 1114 09/04/15 1315    .  He was given sequential compression devices, early ambulation, and ASA for DVT prophylaxis.  He benefited maximally from the hospital stay and there were no complications.    Recent vital signs:  Filed Vitals:   09/06/15 0522  BP: 140/67  Pulse: 76  Temp: 97.9 F (36.6 C)  Resp: 16    Recent laboratory studies:  Lab Results  Component Value Date   HGB 11.7* 09/06/2015   HGB 11.5* 09/05/2015   HGB 16.0 08/25/2015   Lab Results  Component Value Date   WBC 12.1* 09/06/2015   PLT 188 09/06/2015   Lab  Results  Component Value Date   INR 1.06 08/25/2015   Lab Results  Component Value Date   NA 136 09/05/2015   K 3.7 09/05/2015   CL 108 09/05/2015   CO2 25 09/05/2015   BUN 14 09/05/2015   CREATININE 1.11 09/05/2015   GLUCOSE 111* 09/05/2015    Discharge Medications:     Medication List    STOP taking these medications        aspirin 81 MG tablet  Replaced by:  aspirin EC 325 MG tablet     azithromycin 250 MG tablet  Commonly known as:  ZITHROMAX     ibuprofen 200 MG tablet  Commonly known as:  ADVIL,MOTRIN      TAKE these medications        ADVIL COLD/SINUS PO  Take 1 capsule by mouth every 8 (eight) hours as needed (cold and sinus).     ALPRAZolam 0.25 MG tablet  Commonly known as:  XANAX  Take 1/2 tablet by mouth daily as needed for anxiety     aspirin EC 325 MG tablet  Take 1 tablet (325 mg total) by mouth 2 (two) times daily after a meal.     ASTEPRO 0.15 % Soln  Generic drug:  Azelastine HCl  Place 1 spray into the nose daily as  needed (allergy).     atorvastatin 20 MG tablet  Commonly known as:  LIPITOR  Take 20 mg by mouth daily at 6 PM.     docusate sodium 100 MG capsule  Commonly known as:  COLACE  Take 1 capsule (100 mg total) by mouth 2 (two) times daily.     DUREZOL 0.05 % Emul  Generic drug:  Difluprednate  Apply 1 drop to eye every 6 (six) hours.     fluticasone 50 MCG/ACT nasal spray  Commonly known as:  FLONASE  Place 2 sprays into both nostrils daily as needed for allergies.     HYDROcodone-acetaminophen 5-325 MG tablet  Commonly known as:  NORCO  Take 1-2 tablets by mouth every 4 (four) hours as needed for moderate pain.     ketorolac 0.5 % ophthalmic solution  Commonly known as:  ACULAR  Place 1 drop into the right eye 4 (four) times daily.     meloxicam 15 MG tablet  Commonly known as:  MOBIC  Take 1 tablet (15 mg total) by mouth daily.     methocarbamol 500 MG tablet  Commonly known as:  ROBAXIN  Take 1 tablet (500 mg  total) by mouth every 6 (six) hours as needed for muscle spasms.     ofloxacin 0.3 % ophthalmic solution  Commonly known as:  OCUFLOX  Place 1 drop into the right eye every 6 (six) hours.     omeprazole 20 MG capsule  Commonly known as:  PRILOSEC  Take 20 mg by mouth every 3 (three) days.     ondansetron 4 MG tablet  Commonly known as:  ZOFRAN  Take 1 tablet (4 mg total) by mouth every 6 (six) hours as needed for nausea.     senna 8.6 MG Tabs tablet  Commonly known as:  SENOKOT  Take 2 tablets (17.2 mg total) by mouth at bedtime.        Diagnostic Studies: Dg Pelvis Portable  09/04/2015  CLINICAL DATA:  Postop left hip surgery EXAM: PORTABLE PELVIS 1-2 VIEWS COMPARISON:  None. FINDINGS: The patient is status post left total hip arthroplasty. The hardware components are in anatomic alignment. No periprosthetic fracture or dislocation identified. Moderate to advanced degenerative changes are noted involving the right hip. IMPRESSION: 1. No complications after left hip arthroplasty. Electronically Signed   By: Kerby Moors M.D.   On: 09/04/2015 16:51   Dg C-arm Gt 120 Min-no Report  09/04/2015  CLINICAL DATA: surgery C-ARM GT 120 MINUTE Fluoroscopy was utilized by the requesting physician.  No radiographic interpretation.   Dg Hip Operative Unilat With Pelvis Left  09/04/2015  CLINICAL DATA:  69 year old who underwent left total hip arthroplasty for osteoarthritis. EXAM: OPERATIVE LEFT HIP WITH PELVIS COMPARISON:  None. FLUOROSCOPY TIME:  If the device does not provide the exposure index: Fluoroscopy Time:  48 sec Number of Acquired Images:  0 FINDINGS: AP view of the left hip and AP view of the low pelvis were obtained with the C-arm fluoroscopic device and submitted for interpretation postoperatively. Anatomic alignment of the left hip prosthesis in the AP projection. No visible complicating features. Note is made of severe osteoarthritis involving the visualized contralateral right  hip. IMPRESSION: Anatomic alignment in the AP projection post left total hip arthroplasty. Electronically Signed   By: Evangeline Dakin M.D.   On: 09/04/2015 16:13    Disposition: Final discharge disposition not confirmed  Discharge Instructions    Call MD / Call 911    Complete  by:  As directed   If you experience chest pain or shortness of breath, CALL 911 and be transported to the hospital emergency room.  If you develope a fever above 101 F, pus (white drainage) or increased drainage or redness at the wound, or calf pain, call your surgeon's office.     Constipation Prevention    Complete by:  As directed   Drink plenty of fluids.  Prune juice may be helpful.  You may use a stool softener, such as Colace (over the counter) 100 mg twice a day.  Use MiraLax (over the counter) for constipation as needed.     Diet - low sodium heart healthy    Complete by:  As directed      Driving restrictions    Complete by:  As directed   No driving for 6 weeks     Increase activity slowly as tolerated    Complete by:  As directed      Lifting restrictions    Complete by:  As directed   No lifting for 6 weeks     TED hose    Complete by:  As directed   Use stockings (TED hose) for 2 weeks on both leg(s).  You may remove them at night for sleeping.           Follow-up Information    Follow up with Jerad Dunlap, Horald Pollen, MD. Schedule an appointment as soon as possible for a visit in 2 weeks.   Specialty:  Orthopedic Surgery   Why:  For wound re-check   Contact information:   [facility]. [address] [address] [phone]        Signed: Elie Goody 09/06/2015, 8:19 AM

## 2015-09-04 NOTE — Transfer of Care (Signed)
Immediate Anesthesia Transfer of Care Note  Patient: Cody [name]  Procedure(s) Performed: Procedure(s): LEFT TOTAL HIP ARTHROPLASTY ANTERIOR APPROACH (Left)  Patient Location: PACU  Anesthesia Type:Spinal  Level of Consciousness: awake, alert  and oriented  Airway & Oxygen Therapy: Patient Spontanous Breathing and Patient connected to face mask oxygen  Post-op Assessment: Report given to RN and Post -op Vital signs reviewed and stable  Post vital signs: Reviewed and stable  Last Vitals:  Filed Vitals:   09/04/15 1100  BP: 154/88  Pulse: 70  Temp: 36.7 C  Resp: 18    Complications: No apparent anesthesia complications

## 2015-09-04 NOTE — Op Note (Signed)
OPERATIVE REPORT  SURGEON: Rod Can, MD   ASSISTANT: Nehemiah Massed, PA-C.  PREOPERATIVE DIAGNOSIS: Left hip arthritis.   POSTOPERATIVE DIAGNOSIS: Left hip arthritis.   PROCEDURE: Left total hip arthroplasty, anterior approach.   IMPLANTS: DePuy Tri Lock stem, size 6, hi offset. DePuy Pinnacle Cup, size 64 mm. DePuy Altrx liner, size 64 by 36 mm, +4 neutral. DePuy Biolox ceramic head ball, size 36 + 5 mm. 6.21mm cancellous screw x1.  ANESTHESIA:  Spinal  ESTIMATED BLOOD LOSS: 300 mL.  ANTIBIOTICS: 2g ancef.  DRAINS: None.  COMPLICATIONS: None.   CONDITION: PACU - hemodynamically stable.Marland Kitchen   BRIEF CLINICAL NOTE: Cody [name] is a 69 y.o. male with a long-standing history of Left hip arthritis. After failing conservative management, the patient was indicated for total hip arthroplasty. The risks, benefits, and alternatives to the procedure were explained, and the patient elected to proceed.  PROCEDURE IN DETAIL: Surgical site was marked by myself. Spinal anesthesia was obtained in the pre-op holding area. Once inside the operative room, a foley catheter was inserted. The patient was then positioned on the Hana table. All bony prominences were well padded. The hip was prepped and draped in the normal sterile surgical fashion. A time-out was called verifying side and site of surgery. The patient received IV antibiotics within 60 minutes of beginning the procedure.  The direct anterior approach to the hip was performed through the Hueter interval. Lateral femoral circumflex vessels were treated with the Auqumantys. The anterior capsule was exposed and an inverted T capsulotomy was made.The femoral neck cut was made to the level of the templated cut. A corkscrew was placed into the head and the head was removed. The femoral head was found to have eburnated bone. The head was passed to the back table and was measured.  Acetabular exposure was achieved, and the  pulvinar and labrum were excised. Sequental reaming of the acetabulum was then performed up to a size 63 mm reamer. A 64 mm cup was then opened and impacted into place at approximately 40 degrees of abduction and 20 degrees of anteversion. I chose to place one 6.5 mm cancellous screw to augment our already good fixation. The final polyethylene liner was impacted into place and acetabular osteophytes were removed.   I then gained femoral exposure taking care to protect the abductors and greater trochanter. This was performed using standard external rotation, extension, and adduction. The capsule was peeled off the inner aspect of the greater trochanter, taking care to preserve the short external rotators. A cookie cutter was used to enter the femoral canal, and then the femoral canal finder was placed. Sequential broaching was performed up to a size 6. Calcar planer was used on the femoral neck remnant. I paced a hi offset neck and a trial head ball. The hip was reduced. Leg lengths and offset were checked fluoroscopically. The hip was dislocated and trial components were removed. The final implants were placed, and the hip was reduced.  Fluoroscopy was used to confirm component position and leg lengths. At 90 degrees of external rotation and full extension, the hip was stable to an anterior directed force.  The wound was copiously irrigated with a dilute betadine solution followed by normal saline. Marcaine solution was injected into the periarticular soft tissue. The wound was closed in layers using #1 Vicryl and V-Loc for the fascia, 2-0 Vicryl for the subcutaneous fat, 2-0 Monocryl for the deep dermal layer, 3-0 running Monocryl subcuticular stitch, and Dermabond for the skin.  Once the glue was fully dried, an Aquacell Ag dressing was applied. The patient was transported to the recovery room in stable condition. Sponge, needle, and instrument counts were correct at the end of the case x2.  The patient tolerated the procedure well and there were no known complications.  Please note that a surgical assistant was a medical necessity for this procedure to perform it in a safe and expeditious manner. Assistant was necessary to provide appropriate retraction of vital neurovascular structures, to prevent femoral fracture, and to allow for anatomic placement of the prosthesis.

## 2015-09-04 NOTE — Interval H&P Note (Signed)
History and Physical Interval Note:  09/04/2015 12:53 PM  Cody [name]  has presented today for surgery, with the diagnosis of left hip osteoarthritis  The various methods of treatment have been discussed with the patient and family. After consideration of risks, benefits and other options for treatment, the patient has consented to  Procedure(s): LEFT TOTAL HIP ARTHROPLASTY ANTERIOR APPROACH (Left) as a surgical intervention .  The patient's history has been reviewed, patient examined, no change in status, stable for surgery.  I have reviewed the patient's chart and labs.  Questions were answered to the patient's satisfaction.     Cody [name], Horald Pollen

## 2015-09-04 NOTE — Anesthesia Procedure Notes (Signed)
Spinal Patient location during procedure: OR Staffing Anesthesiologist: Catalina Gravel Performed by: anesthesiologist  Preanesthetic Checklist Completed: patient identified, surgical consent, pre-op evaluation, timeout performed, IV checked, risks and benefits discussed and monitors and equipment checked Spinal Block Patient position: sitting Prep: site prepped and draped and DuraPrep Patient monitoring: continuous pulse ox and blood pressure Approach: midline Location: L3-4 Injection technique: single-shot Needle Needle type: Quincke  Needle gauge: 22 G Needle length: 9 cm Additional Notes Functioning IV was confirmed and monitors were applied. Sterile prep and drape, including hand hygiene, mask and sterile gloves were used. The patient was positioned and the spine was prepped. The skin was anesthetized with lidocaine.  Free flow of clear CSF was obtained prior to injecting local anesthetic into the CSF.  The spinal needle aspirated freely following injection.  The needle was carefully withdrawn.  The patient tolerated the procedure well. Consent was obtained prior to procedure with all questions answered and concerns addressed. Risks including but not limited to bleeding, infection, nerve damage, paralysis, failed block, inadequate analgesia, allergic reaction, high spinal, itching and headache were discussed and the patient wished to proceed.   Hoy Morn, MD

## 2015-09-04 NOTE — Discharge Instructions (Signed)
°Dr. Rogan Wigley °Joint Replacement Specialist °[facility] °[address] °[place], [place] [address] °[phone] ° ° °TOTAL HIP REPLACEMENT POSTOPERATIVE DIRECTIONS ° ° ° °Hip Rehabilitation, Guidelines Following Surgery  ° °WEIGHT BEARING °Weight bearing as tolerated with assist device (walker, cane, etc) as directed, use it as long as suggested by your surgeon or therapist, typically at least 4-6 weeks. ° °The results of a hip operation are greatly improved after range of motion and muscle strengthening exercises. Follow all safety measures which are given to protect your hip. If any of these exercises cause increased pain or swelling in your joint, decrease the amount until you are comfortable again. Then slowly increase the exercises. Call your caregiver if you have problems or questions.  ° °HOME CARE INSTRUCTIONS  °Most of the following instructions are designed to prevent the dislocation of your new hip.  °Remove items at home which could result in a fall. This includes throw rugs or furniture in walking pathways.  °Continue medications as instructed at time of discharge. °· You may have some home medications which will be placed on hold until you complete the course of blood thinner medication. °· You may start showering once you are discharged home. Do not remove your dressing. °Do not put on socks or shoes without following the instructions of your caregivers.   °Sit on chairs with arms. Use the chair arms to help push yourself up when arising.  °Arrange for the use of a toilet seat elevator so you are not sitting low.  °· Walk with walker as instructed.  °You may resume a sexual relationship in one month or when given the OK by your caregiver.  °Use walker as long as suggested by your caregivers.  °You may put full weight on your legs and walk as much as is comfortable. °Avoid periods of inactivity such as sitting longer than an hour when not asleep. This helps prevent  blood clots.  °You may return to work once you are cleared by your surgeon.  °Do not drive a car for 6 weeks or until released by your surgeon.  °Do not drive while taking narcotics.  °Wear elastic stockings for two weeks following surgery during the day but you may remove then at night.  °Make sure you keep all of your appointments after your operation with all of your doctors and caregivers. You should call the office at the above phone number and make an appointment for approximately two weeks after the date of your surgery. °Please pick up a stool softener and laxative for home use as long as you are requiring pain medications. °· ICE to the affected hip every three hours for 30 minutes at a time and then as needed for pain and swelling. Continue to use ice on the hip for pain and swelling from surgery. You may notice swelling that will progress down to the foot and ankle.  This is normal after surgery.  Elevate the leg when you are not up walking on it.   °It is important for you to complete the blood thinner medication as prescribed by your doctor. °· Continue to use the breathing machine which will help keep your temperature down.  It is common for your temperature to cycle up and down following surgery, especially at night when you are not up moving around and exerting yourself.  The breathing machine keeps your lungs expanded and your temperature down. ° °RANGE OF MOTION AND STRENGTHENING EXERCISES  °These exercises are   designed to help you keep full movement of your hip joint. Follow your caregiver's or physical therapist's instructions. Perform all exercises about fifteen times, three times per day or as directed. Exercise both hips, even if you have had only one joint replacement. These exercises can be done on a training (exercise) mat, on the floor, on a table or on a bed. Use whatever works the best and is most comfortable for you. Use music or television while you are exercising so that the exercises  are a pleasant break in your day. This will make your life better with the exercises acting as a break in routine you can look forward to.  °Lying on your back, slowly slide your foot toward your buttocks, raising your knee up off the floor. Then slowly slide your foot back down until your leg is straight again.  °Lying on your back spread your legs as far apart as you can without causing discomfort.  °Lying on your side, raise your upper leg and foot straight up from the floor as far as is comfortable. Slowly lower the leg and repeat.  °Lying on your back, tighten up the muscle in the front of your thigh (quadriceps muscles). You can do this by keeping your leg straight and trying to raise your heel off the floor. This helps strengthen the largest muscle supporting your knee.  °Lying on your back, tighten up the muscles of your buttocks both with the legs straight and with the knee bent at a comfortable angle while keeping your heel on the floor.  ° °SKILLED REHAB INSTRUCTIONS: °If the patient is transferred to a skilled rehab facility following release from the hospital, a list of the current medications will be sent to the facility for the patient to continue.  When discharged from the skilled rehab facility, please have the facility set up the patient's Home Health Physical Therapy prior to being released. Also, the skilled facility will be responsible for providing the patient with their medications at time of release from the facility to include their pain medication and their blood thinner medication. If the patient is still at the rehab facility at time of the two week follow up appointment, the skilled rehab facility will also need to assist the patient in arranging follow up appointment in our office and any transportation needs. ° °MAKE SURE YOU:  °Understand these instructions.  °Will watch your condition.  °Will get help right away if you are not doing well or get worse. ° °Pick up stool softner and  laxative for home use following surgery while on pain medications. °Do not remove your dressing. °The dressing is waterproof--it is OK to take showers. °Continue to use ice for pain and swelling after surgery. °Do not use any lotions or creams on the incision until instructed by your surgeon. °Total Hip Protocol. ° ° °

## 2015-09-04 NOTE — H&P (View-Only) (Signed)
TOTAL HIP ADMISSION H&P  Patient is admitted for left total hip arthroplasty.  Subjective:  Chief Complaint: left hip pain  HPI: Cody [name], 69 y.o. male, has a history of pain and functional disability in the left hip(s) due to arthritis and patient has failed non-surgical conservative treatments for greater than 12 weeks to include NSAID's and/or analgesics, flexibility and strengthening excercises, use of assistive devices, weight reduction as appropriate and activity modification.  Onset of symptoms was gradual starting >10 years ago with gradually worsening course since that time.The patient noted no past surgery on the bilaterally hip(s).  Patient currently rates pain in the left hip at 10 out of 10 with activity. Patient has night pain, worsening of pain with activity and weight bearing, pain that interfers with activities of daily living and pain with passive range of motion. Patient has evidence of subchondral cysts, subchondral sclerosis, periarticular osteophytes and joint space narrowing by imaging studies. This condition presents safety issues increasing the risk of falls.There is no current active infection.  Patient Active Problem List   Diagnosis Date Noted  . Hyperlipidemia 06/20/2015  . SLEEP APNEA, OBSTRUCTIVE 01/21/2009  . RHINOCONJUNCTIVITIS, ALLERGIC 01/21/2009   Past Medical History  Diagnosis Date  . History of shingles   . Sleep apnea, obstructive     cpap    Past Surgical History  Procedure Laterality Date  . Eye surgery  2016     (Not in a hospital admission) Allergies  Allergen Reactions  . Penicillins     unknown    Social History  Substance Use Topics  . Smoking status: Former Smoker  . Smokeless tobacco: Not on file  . Alcohol Use: 0.0 oz/week    0 Standard drinks or equivalent per week    Family History  Problem Relation Age of Onset  . Heart failure Mother   . Heart Problems Mother     pacer  . Stroke Father   . Hypertension Sister    . Other Sister     healthty  . Other Son     healthy  . Other Son     healthy     Review of Systems  Constitutional: Positive for malaise/fatigue. Negative for fever, chills, weight loss and diaphoresis.  HENT: Positive for tinnitus.   Eyes: Negative.   Respiratory: Negative.   Cardiovascular: Negative.   Gastrointestinal: Positive for heartburn.  Genitourinary: Negative.   Musculoskeletal: Positive for joint pain.  Skin: Negative.   Neurological: Positive for headaches. Negative for dizziness, tingling, tremors, sensory change, speech change, focal weakness, seizures, loss of consciousness and weakness.  Endo/Heme/Allergies: Negative.   Psychiatric/Behavioral: Negative.     Objective:  Physical Exam  Constitutional: He is oriented to person, place, and time. He appears well-developed and well-nourished.  HENT:  Head: Normocephalic and atraumatic.  Eyes: Conjunctivae and EOM are normal. Pupils are equal, round, and reactive to light.  Neck: Normal range of motion. Neck supple.  Cardiovascular: Normal rate, regular rhythm, normal heart sounds and intact distal pulses.   Respiratory: Effort normal and breath sounds normal. No respiratory distress. He has no wheezes.  GI: Soft. Bowel sounds are normal. He exhibits no distension. There is no tenderness.  Genitourinary:  deferred  Musculoskeletal:       Left hip: He exhibits decreased range of motion and bony tenderness.  Neurological: He is alert and oriented to person, place, and time. He has normal reflexes.  Skin: Skin is warm and dry.  Psychiatric: He has a   normal mood and affect. His behavior is normal. Judgment and thought content normal.    Vital signs in last 24 hours: @VSRANGES@  Labs:   Estimated body mass index is 34.74 kg/(m^2) as calculated from the following:   Height as of 06/20/15: 5' 11" (1.803 m).   Weight as of 06/20/15: 112.946 kg (249 lb).   Imaging Review Plain radiographs demonstrate severe  degenerative joint disease of the bilateral hip(s). The bone quality appears to be adequate for age and reported activity level.  Assessment/Plan:  End stage arthritis, left hip(s)  The patient history, physical examination, clinical judgement of the provider and imaging studies are consistent with end stage degenerative joint disease of the left hip(s) and total hip arthroplasty is deemed medically necessary. The treatment options including medical management, injection therapy, arthroscopy and arthroplasty were discussed at length. The risks and benefits of total hip arthroplasty were presented and reviewed. The risks due to aseptic loosening, infection, stiffness, dislocation/subluxation,  thromboembolic complications and other imponderables were discussed.  The patient acknowledged the explanation, agreed to proceed with the plan and consent was signed. Patient is being admitted for inpatient treatment for surgery, pain control, PT, OT, prophylactic antibiotics, VTE prophylaxis, progressive ambulation and ADL's and discharge planning.The patient is planning to be discharged home with home health services 

## 2015-09-04 NOTE — Anesthesia Preprocedure Evaluation (Addendum)
Anesthesia Evaluation  Patient identified by MRN, date of birth, ID band Patient awake    Reviewed: Allergy & Precautions, NPO status , Patient's Chart, lab work & pertinent test results  Airway Mallampati: III  TM Distance: >3 FB Neck ROM: Full    Dental  (+) Teeth Intact, Dental Advisory Given   Pulmonary sleep apnea and Continuous Positive Airway Pressure Ventilation , former smoker,    Pulmonary exam normal breath sounds clear to auscultation       Cardiovascular Exercise Tolerance: Good negative cardio ROS Normal cardiovascular exam Rhythm:Regular Rate:Normal     Neuro/Psych  Headaches, PSYCHIATRIC DISORDERS Anxiety    GI/Hepatic Neg liver ROS, GERD  Medicated,  Endo/Other  Obesity   Renal/GU negative Renal ROS     Musculoskeletal  (+) Arthritis , Osteoarthritis,    Abdominal   Peds  Hematology negative hematology ROS (+)   Anesthesia Other Findings Day of surgery medications reviewed with the patient.  Reproductive/Obstetrics                           Anesthesia Physical Anesthesia Plan  ASA: II  Anesthesia Plan: Spinal   Post-op Pain Management:    Induction:   Airway Management Planned:   Additional Equipment:   Intra-op Plan:   Post-operative Plan:   Informed Consent: I have reviewed the patients History and Physical, chart, labs and discussed the procedure including the risks, benefits and alternatives for the proposed anesthesia with the patient or authorized representative who has indicated his/her understanding and acceptance.   Dental advisory given  Plan Discussed with: CRNA, Anesthesiologist and Surgeon  Anesthesia Plan Comments: (Discussed risks and benefits of and differences between spinal and general. Discussed risks of spinal including headache, backache, failure, bleeding, infection, and nerve damage. Patient consents to spinal. Questions answered.  Coagulation studies and platelet count acceptable.)        Anesthesia Quick Evaluation

## 2015-09-04 NOTE — Anesthesia Postprocedure Evaluation (Signed)
  Anesthesia Post-op Note  Patient: Cody [name]  Procedure(s) Performed: Procedure(s): LEFT TOTAL HIP ARTHROPLASTY ANTERIOR APPROACH (Left)  Patient Location: PACU  Anesthesia Type:Spinal  Level of Consciousness: awake, alert , oriented and patient cooperative  Airway and Oxygen Therapy: Patient Spontanous Breathing and Patient connected to nasal cannula oxygen  Post-op Pain: mild  Post-op Assessment: Post-op Vital signs reviewed, Patient's Cardiovascular Status Stable, Respiratory Function Stable, Patent Airway, No signs of Nausea or vomiting, Pain level controlled, No headache, No backache, Spinal receding and Patient able to bend at knees LLE Motor Response: Non-purposeful movement LLE Sensation: Decreased RLE Motor Response: Non-purposeful movement (bendsd knee off bed) RLE Sensation: Decreased L Sensory Level: L4-Anterior knee, lower leg R Sensory Level: L4-Anterior knee, lower leg  Post-op Vital Signs: Reviewed and stable  Last Vitals:  Filed Vitals:   09/04/15 1745  BP: 127/64  Pulse: 59  Temp: 36.4 C  Resp: 16    Complications: No apparent anesthesia complications

## 2015-09-05 LAB — BASIC METABOLIC PANEL
ANION GAP: 3 — AB (ref 5–15)
BUN: 14 mg/dL (ref 6–20)
CHLORIDE: 108 mmol/L (ref 101–111)
CO2: 25 mmol/L (ref 22–32)
Calcium: 8.3 mg/dL — ABNORMAL LOW (ref 8.9–10.3)
Creatinine, Ser: 1.11 mg/dL (ref 0.61–1.24)
Glucose, Bld: 111 mg/dL — ABNORMAL HIGH (ref 65–99)
POTASSIUM: 3.7 mmol/L (ref 3.5–5.1)
SODIUM: 136 mmol/L (ref 135–145)

## 2015-09-05 LAB — CBC
HCT: 33.4 % — ABNORMAL LOW (ref 39.0–52.0)
HEMOGLOBIN: 11.5 g/dL — AB (ref 13.0–17.0)
MCH: 29.7 pg (ref 26.0–34.0)
MCHC: 34.4 g/dL (ref 30.0–36.0)
MCV: 86.3 fL (ref 78.0–100.0)
PLATELETS: 157 10*3/uL (ref 150–400)
RBC: 3.87 MIL/uL — AB (ref 4.22–5.81)
RDW: 13.4 % (ref 11.5–15.5)
WBC: 7.2 10*3/uL (ref 4.0–10.5)

## 2015-09-05 MED ORDER — SENNA 8.6 MG PO TABS: 2.0000 | ORAL_TABLET | Freq: Every day | ORAL | Status: DC

## 2015-09-05 MED ORDER — HYDROCODONE-ACETAMINOPHEN 5-325 MG PO TABS: 1.0000 | ORAL_TABLET | ORAL | Status: DC | PRN

## 2015-09-05 MED ORDER — ASPIRIN EC 325 MG PO TBEC: 325.0000 mg | DELAYED_RELEASE_TABLET | Freq: Two times a day (BID) | ORAL | Status: DC

## 2015-09-05 MED ORDER — MELOXICAM 15 MG PO TABS: 15.0000 mg | ORAL_TABLET | Freq: Every day | ORAL | Status: DC

## 2015-09-05 MED ORDER — METHOCARBAMOL 500 MG PO TABS: 500.0000 mg | ORAL_TABLET | Freq: Four times a day (QID) | ORAL | Status: DC | PRN

## 2015-09-05 MED ORDER — TRAMADOL HCL 50 MG PO TABS: 50.0000 mg | ORAL_TABLET | Freq: Four times a day (QID) | ORAL | Status: DC | PRN

## 2015-09-05 MED ORDER — DOCUSATE SODIUM 100 MG PO CAPS: 100.0000 mg | ORAL_CAPSULE | Freq: Two times a day (BID) | ORAL | Status: DC

## 2015-09-05 MED ORDER — ONDANSETRON HCL 4 MG PO TABS: 4.0000 mg | ORAL_TABLET | Freq: Four times a day (QID) | ORAL | Status: DC | PRN

## 2015-09-05 NOTE — Evaluation (Signed)
Physical Therapy Evaluation Patient Details Name: Cody [name] MRN: [id number] DOB: 03-26-1946 Today's Date: 09/05/2015   History of Present Illness  L THR  Clinical Impression  Pt s/p L THR presents with decreased L LE strength/ROM and post op pain limiting functional mobility.  Pt should progress to dc home with family assist and HHPT follow up.  Pt ltd this am with c/o dizziness - BP with ambulation 138/68.    Follow Up Recommendations Home health PT    Equipment Recommendations  None recommended by PT    Recommendations for Other Services OT consult     Precautions / Restrictions Precautions Precautions: Fall Precaution Comments: Pt states Dr Delfino Lovett told him not to bend over - orders on chart state "no hip precautions" Restrictions Weight Bearing Restrictions: No      Mobility  Bed Mobility Overal bed mobility: Needs Assistance Bed Mobility: Supine to Sit     Supine to sit: Min assist     General bed mobility comments: cues for sequence and use of R LE to self assist  Transfers Overall transfer level: Needs assistance Equipment used: Rolling walker (2 wheeled) Transfers: Sit to/from Stand Sit to Stand: Min assist         General transfer comment: cues for LE management and use of UEs to self assist  Ambulation/Gait Ambulation/Gait assistance: Min assist Ambulation Distance (Feet): 68 Feet Assistive device: Rolling walker (2 wheeled) Gait Pattern/deviations: Step-to pattern;Step-through pattern;Decreased step length - right;Decreased step length - left;Shuffle;Trunk flexed Gait velocity: decr Gait velocity interpretation: Below normal speed for age/gender General Gait Details: cues for sequence, posture and position from [employer]            Wheelchair Mobility    Modified Rankin (Stroke Patients Only)       Balance                                             Pertinent Vitals/Pain Pain Assessment: 0-10 Pain  Score: 3  Pain Location: L hip Pain Descriptors / Indicators: Aching;Sore Pain Intervention(s): Limited activity within patient's tolerance;Monitored during session;Premedicated before session;Ice applied    Home Living Family/patient expects to be discharged to:: Private residence Living Arrangements: Spouse/significant other Available Help at Discharge: Family Type of Home: House Home Access: Stairs to enter Entrance Stairs-Rails: None Entrance Stairs-Number of Steps: 3 Home Layout: Able to live on main level with bedroom/bathroom Home Equipment: Walker - 2 wheels;Cane - single point      Prior Function Level of Independence: Independent               Hand Dominance        Extremity/Trunk Assessment   Upper Extremity Assessment: Overall WFL for tasks assessed           Lower Extremity Assessment: LLE deficits/detail   LLE Deficits / Details: Strength at hip 2+/5 with AAROM at hip to 75 flex and 15 abd  Cervical / Trunk Assessment: Normal  Communication   Communication: No difficulties  Cognition Arousal/Alertness: Awake/alert Behavior During Therapy: WFL for tasks assessed/performed Overall Cognitive Status: Within Functional Limits for tasks assessed                      General Comments      Exercises Total Joint Exercises Ankle Circles/Pumps: AROM;Both;15 reps;Supine Quad Sets: AROM;Both;10 reps;Supine Heel Slides: AAROM;15  reps;Supine;Left Hip ABduction/ADduction: AAROM;Left;10 reps;Supine      Assessment/Plan    PT Assessment Patient needs continued PT services  PT Diagnosis Difficulty walking   PT Problem List Decreased strength;Decreased range of motion;Decreased activity tolerance;Decreased mobility;Decreased knowledge of use of DME;Obesity;Pain  PT Treatment Interventions DME instruction;Gait training;Functional mobility training;Stair training;Therapeutic activities;Therapeutic exercise;Patient/family education   PT Goals  (Current goals can be found in the Care Plan section) Acute Rehab PT Goals Patient Stated Goal: resume previous lifestyle with decreased pain PT Goal Formulation: With patient Time For Goal Achievement: 09/10/15 Potential to Achieve Goals: Good    Frequency 7X/week   Barriers to discharge        Co-evaluation               End of Session Equipment Utilized During Treatment: Gait belt Activity Tolerance: Patient tolerated treatment well Patient left: in chair;with call bell/phone within reach;with family/visitor present Nurse Communication: Mobility status         Time: [id number] PT Time Calculation (min) (ACUTE ONLY): 38 min   Charges:   PT Evaluation $Initial PT Evaluation Tier I: 1 Procedure PT Treatments $Gait Training: 8-22 mins $Therapeutic Exercise: 8-22 mins   PT G Codes:        Brittish Bolinger September 25, 2015, 10:36 AM

## 2015-09-05 NOTE — Progress Notes (Signed)
Pt declined assistance with cpap, stating he can place it on himself when ready for bed and feels comfortable doing so.  Cpap settings are at 11cm h2o per home regimen.  Pt has his tubing and nasal pillows from home.  Sterile water added to humidifier.  Pt was advised that RT is available all night should he need further assistance.

## 2015-09-05 NOTE — Evaluation (Signed)
Occupational Therapy Evaluation Patient Details Name: Cody [name] MRN: [id number] DOB: 03-08-1946 Today's Date: 09/05/2015    History of Present Illness L THR   Clinical Impression   Patient presenting with decreased ADL and functional mobility independence secondary to above. Patient independent PTA. Patient currently functioning at an overall min to mod assist level. Patient will benefit from acute OT to increase overall independence in the areas of ADLs, functional mobility, and overall safety in order to safely discharge home with wife.     Follow Up Recommendations  No OT follow up;Supervision/Assistance - 24 hour    Equipment Recommendations  3 in 1 bedside comode;Other (comment) (AE - reacher, sock aid, LH sponge, LH shoe horn)    Recommendations for Other Services  None at this time    Precautions / Restrictions Precautions Precautions: Fall Precaution Comments: Pt states Dr Delfino Lovett told him not to bend over - orders on chart state "no hip precautions" Restrictions Weight Bearing Restrictions: Yes LLE Weight Bearing: Weight bearing as tolerated    Mobility Bed Mobility Overal bed mobility: Needs Assistance Bed Mobility: Supine to Sit     Supine to sit: Min assist     General bed mobility comments: cues for sequence and use of R LE to self assist  Transfers Overall transfer level: Needs assistance Equipment used: Rolling walker (2 wheeled) Transfers: Sit to/from Stand Sit to Stand: Min assist         General transfer comment: cues for LE management and use of UEs to self assist    Balance Overall balance assessment: Needs assistance Sitting-balance support: No upper extremity supported;Feet supported Sitting balance-Leahy Scale: Good     Standing balance support: Bilateral upper extremity supported;During functional activity Standing balance-Leahy Scale: Fair    ADL Overall ADL's : Needs assistance/impaired Eating/Feeding: Set up;Sitting    Grooming: Set up;Sitting   Upper Body Bathing: Minimal assitance;Standing   Lower Body Bathing: Minimal assistance;Sit to/from stand   Upper Body Dressing : Minimal assistance;Standing   Lower Body Dressing: Moderate assistance;Sit to/from stand   Toilet Transfer: Minimal assistance;RW;Grab bars;Comfort height toilet;Ambulation   Toileting- Clothing Manipulation and Hygiene: Supervision/safety;Sitting/lateral lean       Functional mobility during ADLs: Minimal assistance;Rolling walker;Cueing for safety General ADL Comments: Pt found seated in recliner. Pt stated he felt a little dizzy from this morning. Pt willing to work with therapist. Pt stood with RW and ambulated into BR for toilet transfer. Pt with successful urine output. Therapist went over use of AE for LB ADLs and how to use tub transfer bench in tub/shower unit. Wife present and supportive.     Pertinent Vitals/Pain Pain Assessment: 0-10 Pain Score: 3  Pain Location: L hip Pain Descriptors / Indicators: Aching;Sore Pain Intervention(s): Limited activity within patient's tolerance;Monitored during session;Premedicated before session;Ice applied     Hand Dominance Right   Extremity/Trunk Assessment Upper Extremity Assessment Upper Extremity Assessment: Overall WFL for tasks assessed   Lower Extremity Assessment Lower Extremity Assessment: Defer to PT evaluation LLE Deficits / Details: Strength at hip 2+/5 with AAROM at hip to 75 flex and 15 abd   Cervical / Trunk Assessment Cervical / Trunk Assessment: Normal   Communication Communication Communication: No difficulties   Cognition Arousal/Alertness: Awake/alert Behavior During Therapy: WFL for tasks assessed/performed Overall Cognitive Status: Within Functional Limits for tasks assessed              Home Living Family/patient expects to be discharged to:: Private residence Living Arrangements: Spouse/significant other Available  Help at Discharge:  Family Type of Home: House Home Access: Stairs to enter [employer] of Steps: 3 Entrance Stairs-Rails: None Home Layout: Able to live on main level with bedroom/bathroom     Bathroom Shower/Tub: Tub/shower unit;Curtain   Biochemist, clinical: Standard     Home Equipment: Environmental consultant - 2 wheels;Cane - single point;Tub bench          Prior Functioning/Environment Level of Independence: Independent     OT Diagnosis: Generalized weakness;Acute pain   OT Problem List: Decreased strength;Decreased range of motion;Decreased activity tolerance;Impaired balance (sitting and/or standing);Decreased safety awareness;Decreased knowledge of use of DME or AE;Pain   OT Treatment/Interventions: Self-care/ADL training;Therapeutic exercise;Energy conservation;DME and/or AE instruction;Therapeutic activities;Patient/family education;Balance training    OT Goals(Current goals can be found in the care plan section) Acute Rehab OT Goals Patient Stated Goal: figure out medications OT Goal Formulation: With patient/family Time For Goal Achievement: 09/19/15 Potential to Achieve Goals: Good ADL Goals Pt Will Perform Grooming: with modified independence;standing Pt Will Perform Lower Body Bathing: with modified independence;sit to/from stand;with adaptive equipment Pt Will Perform Lower Body Dressing: with modified independence;sit to/from stand;with adaptive equipment Pt Will Transfer to Toilet: with modified independence;ambulating;bedside commode Pt Will Perform Tub/Shower Transfer: Tub transfer;ambulating;rolling walker;tub bench;with modified independence Additional ADL Goal #1: Pt will be mod I with functional mobility using RW prn  OT Frequency: Min 2X/week   Barriers to D/C: None known at this time   End of Session Equipment Utilized During Treatment: Gait belt;Rolling walker  Activity Tolerance: Patient tolerated treatment well Patient left: in chair;with call bell/phone within  reach;with family/visitor present;with chair alarm set   Time: [id number] OT Time Calculation (min): 36 min Charges:  OT General Charges $OT Visit: 1 Procedure OT Evaluation $Initial OT Evaluation Tier I: 1 Procedure OT Treatments $Self Care/Home Management : 8-22 mins  Skila Rollins , MS, OTR/L, CLT Pager: [phone]  09/05/2015, 12:07 PM

## 2015-09-05 NOTE — Progress Notes (Signed)
1400  Dr Lyla Glassing paged, patient c/o nausea with current pain meds. Orders received. Discharge cancelled [place]

## 2015-09-05 NOTE — Progress Notes (Signed)
1800 patient bladder scanned for 913ml. In and out cath performed with 1084ml returned D Bethanie Dicker

## 2015-09-05 NOTE — Progress Notes (Signed)
   Subjective:  Patient reports pain as mild to moderate.  No c/o. Denies N/V/CP/SOB.  Objective:   VITALS:   Filed Vitals:   09/04/15 2100 09/04/15 2218 09/05/15 0034 09/05/15 0648  BP: 138/71 124/65 117/61 113/67  Pulse: 71 68 71 64  Temp: 98.2 F (36.8 C) 98.2 F (36.8 C) 98.2 F (36.8 C) 97.9 F (36.6 C)  TempSrc: Oral Oral Oral Oral  Resp: 16 16 18 18   Height:      Weight:      SpO2: 100% 98% 98% 98%    ABD soft Sensation intact distally Intact pulses distally Dorsiflexion/Plantar flexion intact Incision: dressing C/D/I Compartment soft   Lab Results  Component Value Date   WBC 7.2 09/05/2015   HGB 11.5* 09/05/2015   HCT 33.4* 09/05/2015   MCV 86.3 09/05/2015   PLT 157 09/05/2015   BMET    Component Value Date/Time   NA 136 09/05/2015 0518   K 3.7 09/05/2015 0518   CL 108 09/05/2015 0518   CO2 25 09/05/2015 0518   GLUCOSE 111* 09/05/2015 0518   BUN 14 09/05/2015 0518   CREATININE 1.11 09/05/2015 0518   CALCIUM 8.3* 09/05/2015 0518   GFRNONAA >60 09/05/2015 0518   GFRAA >60 09/05/2015 0518     Assessment/Plan: 1 Day Post-Op   Principal Problem:   Osteoarthritis of left hip   WBAT with walker DVT ppx: ASA, SCDs, TEDs PO pain control IV abx x23 hrs PT/OT Dispo: d/c home after clears PT/OT, likely tomorrow   Jesslyn Viglione, Horald Pollen 09/05/2015, 7:00 AM   Rod Can, MD Cell [phone]

## 2015-09-05 NOTE — Care Management Note (Signed)
Case Management Note  Patient Details  Name: KEYSHAWN SNIDE MRN: [id number] Date of Birth: 11-02-1945  Subjective/Objective:      Left total hip arthroplasty, anterior approach.              Action/Plan: Discharge planning, spoke with patient and wife at bedside. Have chosen [facility] for [facility], patient has B[facility] from [facility]. Contacted [facility] for referral, also needs RW and 3-n-1 per PT/OT recommendations.   Expected Discharge Date:                  Expected Discharge Plan:  [place]  In-House Referral:  NA  Discharge planning Services  CM Consult  Post Acute Care Choice:  Home Health Choice offered to:  Patient  DME Arranged:  3-N-1, Walker rolling DME Agency:  [place]:  PT [place]:  [place]  Status of Service:  Completed, signed off  Medicare Important Message Given:    Date Medicare IM Given:    Medicare IM give by:    Date Additional Medicare IM Given:    Additional Medicare Important Message give by:     If discussed at [place] of Stay Meetings, dates discussed:    Additional Comments:  Guadalupe Maple, RN 09/05/2015, 12:33 PM

## 2015-09-05 NOTE — Progress Notes (Signed)
Physical Therapy Treatment Patient Details Name: FAHEEM CLYDE MRN: [id number] DOB: 11/17/1945 Today's Date: 09/05/2015    History of Present Illness L THR    PT Comments    Pt motivated and progressing with mobility but ltd by continued c/o dizziness - ? meds.  Follow Up Recommendations  Home health PT     Equipment Recommendations  None recommended by PT    Recommendations for Other Services OT consult     Precautions / Restrictions Precautions Precautions: Fall Precaution Comments: Pt states Dr Delfino Lovett told him not to bend over - orders on chart state "no hip precautions" Restrictions Weight Bearing Restrictions: No LLE Weight Bearing: Weight bearing as tolerated    Mobility  Bed Mobility Overal bed mobility: Needs Assistance Bed Mobility: Sit to Supine       Sit to supine: Min guard   General bed mobility comments: cues for sequence and use of R LE to self assist  Transfers Overall transfer level: Needs assistance Equipment used: Rolling walker (2 wheeled) Transfers: Sit to/from Stand Sit to Stand: Min guard         General transfer comment: cues for LE management and use of UEs to self assist  Ambulation/Gait Ambulation/Gait assistance: Min assist;Min guard Ambulation Distance (Feet): 200 Feet Assistive device: Rolling walker (2 wheeled) Gait Pattern/deviations: Step-to pattern;Step-through pattern;Decreased step length - right;Decreased step length - left;Shuffle;Trunk flexed Gait velocity: decr   General Gait Details: cues for sequence, posture and position from [employer]            Wheelchair Mobility    Modified Rankin (Stroke Patients Only)       Balance Overall balance assessment: Needs assistance Sitting-balance support: No upper extremity supported;Feet supported Sitting balance-Leahy Scale: Good     Standing balance support: Bilateral upper extremity supported;During functional activity Standing balance-Leahy  Scale: Fair                      Cognition Arousal/Alertness: Awake/alert Behavior During Therapy: WFL for tasks assessed/performed Overall Cognitive Status: Within Functional Limits for tasks assessed                      Exercises Total Joint Exercises Ankle Circles/Pumps: AROM;Both;15 reps;Supine Quad Sets: AROM;Both;10 reps;Supine Heel Slides: AAROM;Supine;Left;20 reps Hip ABduction/ADduction: AAROM;Left;Supine;15 reps    General Comments        Pertinent Vitals/Pain Pain Assessment: 0-10 Pain Score: 3  Pain Location: L hip Pain Descriptors / Indicators: Aching;Sore Pain Intervention(s): Limited activity within patient's tolerance;Monitored during session;Premedicated before session;Ice applied    Home Living Family/patient expects to be discharged to:: Private residence Living Arrangements: Spouse/significant other Available Help at Discharge: Family Type of Home: House Home Access: Stairs to enter Entrance Stairs-Rails: None Home Layout: Able to live on main level with bedroom/bathroom Home Equipment: Environmental consultant - 2 wheels;Cane - single point;Tub bench      Prior Function Level of Independence: Independent          PT Goals (current goals can now be found in the care plan section) Acute Rehab PT Goals Patient Stated Goal: figure out medications PT Goal Formulation: With patient Time For Goal Achievement: 09/10/15 Potential to Achieve Goals: Good Progress towards PT goals: Progressing toward goals    Frequency  7X/week    PT Plan Current plan remains appropriate    Co-evaluation             End of Session Equipment Utilized During Treatment:  Gait belt Activity Tolerance: Patient tolerated treatment well Patient left: in bed;with call bell/phone within reach;with family/visitor present     Time: 1355-1431 PT Time Calculation (min) (ACUTE ONLY): 36 min  Charges:  $Gait Training: 8-22 mins $Therapeutic Exercise: 8-22 mins                     G Codes:      Devonia Farro Sep 26, 2015, 3:22 PM

## 2015-09-06 LAB — CBC
HEMATOCRIT: 33.2 % — AB (ref 39.0–52.0)
HEMOGLOBIN: 11.7 g/dL — AB (ref 13.0–17.0)
MCH: 29.3 pg (ref 26.0–34.0)
MCHC: 35.2 g/dL (ref 30.0–36.0)
MCV: 83.2 fL (ref 78.0–100.0)
Platelets: 188 10*3/uL (ref 150–400)
RBC: 3.99 MIL/uL — AB (ref 4.22–5.81)
RDW: 12.7 % (ref 11.5–15.5)
WBC: 12.1 10*3/uL — ABNORMAL HIGH (ref 4.0–10.5)

## 2015-09-06 NOTE — Progress Notes (Signed)
   Subjective:  Patient reports pain as mild to moderate.  No c/o. Had nausea with 2 norco, ok when takes 1.  Objective:   VITALS:   Filed Vitals:   09/05/15 1645 09/05/15 1851 09/05/15 2234 09/06/15 0522  BP: 175/77 153/70 150/71 140/67  Pulse:   85 76  Temp:   98.2 F (36.8 C) 97.9 F (36.6 C)  TempSrc:   Oral Oral  Resp:   16 16  Height:      Weight:      SpO2:   100% 100%    ABD soft Sensation intact distally Intact pulses distally Dorsiflexion/Plantar flexion intact Incision: dressing C/D/I Compartment soft   Lab Results  Component Value Date   WBC 12.1* 09/06/2015   HGB 11.7* 09/06/2015   HCT 33.2* 09/06/2015   MCV 83.2 09/06/2015   PLT 188 09/06/2015   BMET    Component Value Date/Time   NA 136 09/05/2015 0518   K 3.7 09/05/2015 0518   CL 108 09/05/2015 0518   CO2 25 09/05/2015 0518   GLUCOSE 111* 09/05/2015 0518   BUN 14 09/05/2015 0518   CREATININE 1.11 09/05/2015 0518   CALCIUM 8.3* 09/05/2015 0518   GFRNONAA >60 09/05/2015 0518   GFRAA >60 09/05/2015 0518     Assessment/Plan: 2 Days Post-Op   Principal Problem:   Osteoarthritis of left hip   WBAT with walker DVT ppx: ASA, SCDs, TEDs PO pain control PT/OT Dispo: d/c home today   Elie Goody 09/06/2015, 8:18 AM   Rod Can, MD Cell [phone]

## 2015-09-06 NOTE — Progress Notes (Signed)
Occupational Therapy Treatment Patient Details Name: Cody [name] MRN: [id number] DOB: Jul 19, 1946 Today's Date: 09/06/2015    History of present illness L THR-DA   OT comments  Educated at length on tub transfer bench sequence and pt verbalizes understanding--he declines need to practice and states he understands technique. Also educated on AE options for LB self care--he states he has a sock aid that his wife purchased for him and already has a long shoe horn. He states wife can help with LB bathing and he may consider purchasing a reacher also. Educated on sequence for LB dressing and practiced with AE. Pt for d/c today hopefully.   Follow Up Recommendations  No OT follow up; 24/7 supervision.    Equipment Recommendations  3 in 1 bedside comode    Recommendations for Other Services      Precautions / Restrictions Precautions Precautions: Fall Precaution Comments: Pt cleared by Dr Delfino Lovett for fwd flex Restrictions Weight Bearing Restrictions: No LLE Weight Bearing: Weight bearing as tolerated       Mobility Bed Mobility   Transfers     Balance     Sitting balance-Leahy Scale: Good                            ADL                                         General ADL Comments: Pt states wife purchased a tub transfer bench but had questions about weight capacity. Educated pt on guardian tubbench and [employer] brands and pt's wife came in during session and states theirs is not the bigger plastic version like the [employer]. Pt states he is close to the 250 lb weight capacity that is printed on his tub bench and he was concerned if the bench would hold his weight. Explained to pt if he had concerns to call the company and ask questions regarding weight limit. Demonstrated the technique for how to transfer on and off the bench and having walker in front of him when he back up to seat as well as when he gets ready to stand from seat. Pt states  he did practice on and off his bench at home before surgery and declined needing to actually practice transfer but he state the demo was helpful. Wife has purchased a terry cloth sock aid so practiced with hard shell sock aid and pt donned L sock with supervision. Pt states he would like to obtain a reacher so practiced with donning pants over L LE only with supervision. Pt states he has a long shoe horn and wife can assist with LB bathing. Discussed 3in1 option and pt still would like a 3in1 instead of the extended seat he has for commode at home. note 3in1 has been ordered.  Discussed having walker in front of him when he stands to pull up clothing in case he needs to hold for balance support. Wife verbalizes understaniding of education. Reviewed sequence for LB dressing also. Pt states his bathroom is a tight space through doorway so demonstrated how pt can side step through doorway with walker.       Vision                     Quarry manager  Behavior During Therapy: WFL for tasks assessed/performed Overall Cognitive Status: Within Functional Limits for tasks assessed                       Extremity/Trunk Assessment               Exercises    Shoulder Instructions       General Comments      Pertinent Vitals/ Pain       Pain Assessment: 0-10 Pain Score: 3  Pain Location: L hip Pain Descriptors / Indicators: Aching;Sore Pain Intervention(s): Limited activity within patient's tolerance;Monitored during session;Premedicated before session;Ice applied  Home Living                                          Prior Functioning/Environment              Frequency Min 2X/week     Progress Toward Goals  OT Goals(current goals can now be found in the care plan section)  Progress towards OT goals: Progressing toward goals  Acute Rehab OT Goals Patient Stated Goal: home  Plan Discharge plan remains appropriate     Co-evaluation                 End of Session Equipment Utilized During Treatment: Other (comment) (AE)   Activity Tolerance Patient tolerated treatment well   Patient Left in chair;with call bell/phone within reach;with family/visitor present   Nurse Communication          Time: [id number] OT Time Calculation (min): 45 min  Charges: OT General Charges $OT Visit: 1 Procedure OT Treatments $Self Care/Home Management : 8-22 mins $Therapeutic Activity: 23-37 mins  Jules Schick  [id number] 09/06/2015, 11:57 AM

## 2015-09-06 NOTE — Care Management Note (Signed)
Case Management Note  Patient Details  Name: OMORO TOTZKE MRN: [id number] Date of Birth: 10/21/1946  Subjective/Objective:      Left Total Hip Arthroplasty on 09/04/2015               Action/Plan: NCM spoke to pt and agreed with [facility] for [facility]. Please see previous NCM notes. Contacted [facility] to make aware pt has selected [facility] for [facility]. Requesting RW and 3n1 for home. Contacted [facility] for DME for scheduled dc home today.   Expected Discharge Date:  09/06/2015              Expected Discharge Plan:  [place]  In-House Referral:  NA  Discharge planning Services  CM Consult  Post Acute Care Choice:  Home Health Choice offered to:  Patient  DME Arranged:  3-N-1, Walker rolling DME Agency:  [place]:  PT [place]:  [place]  Status of Service:  Completed, signed off  Medicare Important Message Given:    Date Medicare IM Given:    Medicare IM give by:    Date Additional Medicare IM Given:    Additional Medicare Important Message give by:     If discussed at [place] of Stay Meetings, dates discussed:    Additional Comments:  Erenest Rasher, RN 09/06/2015, 10:48 AM

## 2015-09-06 NOTE — Progress Notes (Signed)
Discharged from floor via w/c, belongings & wife with pt. No changes in assessment. Cody [name]  

## 2015-09-06 NOTE — Progress Notes (Signed)
Physical Therapy Treatment Patient Details Name: Cody [name] MRN: [id number] DOB: [birth year] Today's Date: 09/06/2015    History of Present Illness L THR-DA    PT Comments    Pt motivated and eager for dc home today.  Reviewed stairs, car transfers and therex.  Follow Up Recommendations  Home health PT     Equipment Recommendations  None recommended by PT    Recommendations for Other Services OT consult     Precautions / Restrictions Precautions Precautions: Fall Precaution Comments: Pt cleared by Dr Delfino Lovett for fwd flex Restrictions Weight Bearing Restrictions: No LLE Weight Bearing: Weight bearing as tolerated    Mobility  Bed Mobility Overal bed mobility: Needs Assistance Bed Mobility: Supine to Sit     Supine to sit: Supervision     General bed mobility comments: cues for sequence and use of R LE to self assist  Transfers Overall transfer level: Needs assistance Equipment used: Rolling walker (2 wheeled) Transfers: Sit to/from Stand Sit to Stand: Supervision         General transfer comment: cues for LE management and use of UEs to self assist  Ambulation/Gait Ambulation/Gait assistance: Min guard;Supervision Ambulation Distance (Feet): 275 Feet Assistive device: Rolling walker (2 wheeled) Gait Pattern/deviations: Step-to pattern;Shuffle;Trunk flexed     General Gait Details: cues for sequence, posture and position from RW   Stairs Stairs: Yes Stairs assistance: Min assist Stair Management: No rails;Step to pattern;Backwards;With walker Number of Stairs: 6 General stair comments: cues for sequence and foot/RW placement  Wheelchair Mobility    Modified Rankin (Stroke Patients Only)       Balance     Sitting balance-Leahy Scale: Good       Standing balance-Leahy Scale: Fair                      Cognition Arousal/Alertness: Awake/alert Behavior During Therapy: WFL for tasks assessed/performed Overall Cognitive  Status: Within Functional Limits for tasks assessed                      Exercises Total Joint Exercises Ankle Circles/Pumps: AROM;Both;15 reps;Supine Quad Sets: AROM;Both;10 reps;Supine Gluteal Sets: AROM;Both;10 reps;Supine Heel Slides: AAROM;Supine;Left;20 reps Hip ABduction/ADduction: AAROM;Left;Supine;15 reps    General Comments        Pertinent Vitals/Pain Pain Assessment: 0-10 Pain Score: 3  Pain Location: L hip Pain Descriptors / Indicators: Aching;Sore Pain Intervention(s): Limited activity within patient's tolerance;Monitored during session;Premedicated before session;Ice applied    Home Living                      Prior Function            PT Goals (current goals can now be found in the care plan section) Acute Rehab PT Goals Patient Stated Goal: home PT Goal Formulation: With patient Time For Goal Achievement: 09/10/15 Potential to Achieve Goals: Good Progress towards PT goals: Progressing toward goals    Frequency  7X/week    PT Plan Current plan remains appropriate    Co-evaluation             End of Session Equipment Utilized During Treatment: Gait belt Activity Tolerance: Patient tolerated treatment well Patient left: in chair;with call bell/phone within reach     Time: 0855-0942 PT Time Calculation (min) (ACUTE ONLY): 47 min  Charges:  $Gait Training: 8-22 mins $Therapeutic Exercise: 8-22 mins $Therapeutic Activity: 8-22 mins  G Codes:      Keiondre Colee 13-Sep-2015, 11:43 AM

## 2015-12-23 DIAGNOSIS — G4733 Obstructive sleep apnea (adult) (pediatric): Secondary | ICD-10-CM | POA: Diagnosis not present

## 2016-01-08 IMAGING — RF DG HIP (WITH PELVIS) OPERATIVE*L*
1 series · 2 of 2 positions shown · non-contrast
Comparison: None.

CLINICAL DATA: 69-year-old who underwent left total hip
arthroplasty for osteoarthritis.

EXAM:
OPERATIVE LEFT HIP WITH PELVIS

[Series 1: run · 2 of 2 slices shown]
[im 1/2]
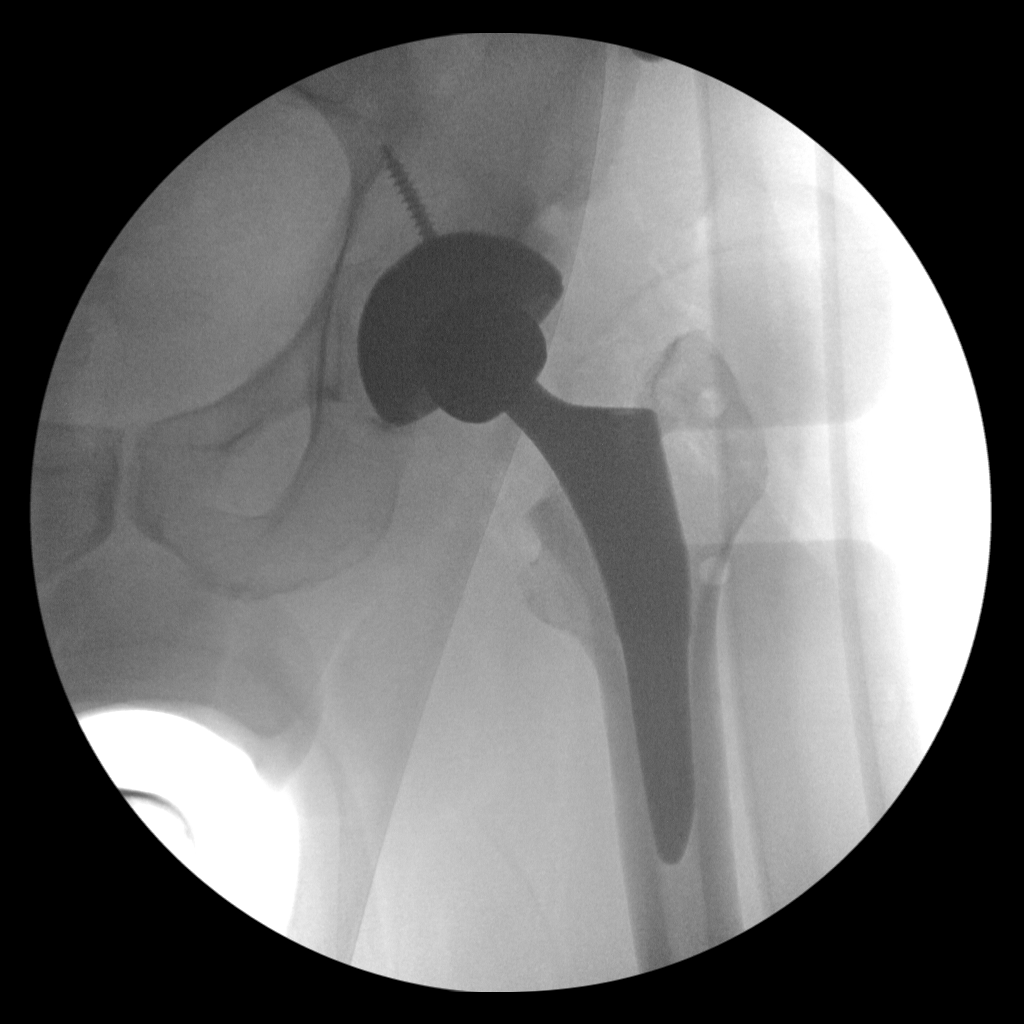
[im 2/2]
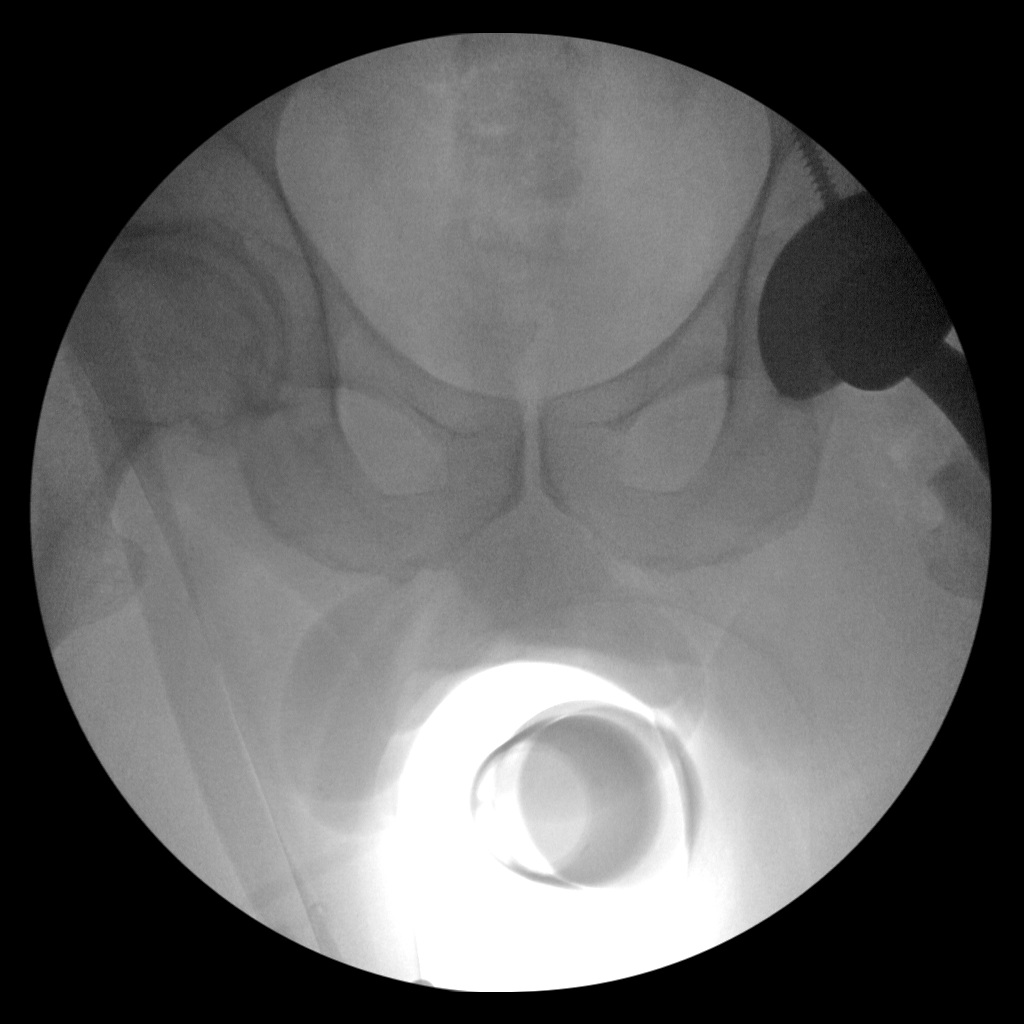

[2 of 2 positions shown; findings below may reference images not displayed]

FLUOROSCOPY TIME:  If the device does not provide the exposure
index:

Fluoroscopy Time:  48 sec

Number of Acquired Images:  0
FINDINGS: AP view of the left hip and AP view of the low pelvis were obtained
with the C-arm fluoroscopic device and submitted for interpretation
postoperatively. Anatomic alignment of the left hip prosthesis in
the AP projection. No visible complicating features. Note is made of
severe osteoarthritis involving the visualized contralateral right
hip.
IMPRESSION: Anatomic alignment in the AP projection post left total hip
arthroplasty.

## 2016-01-08 IMAGING — DX DG PORTABLE PELVIS
2 series · 2 of 2 positions shown · non-contrast
Comparison: None.

CLINICAL DATA: Postop left hip surgery

EXAM:
PORTABLE PELVIS 1-2 VIEWS

[pelvis ap (1 of 2)]
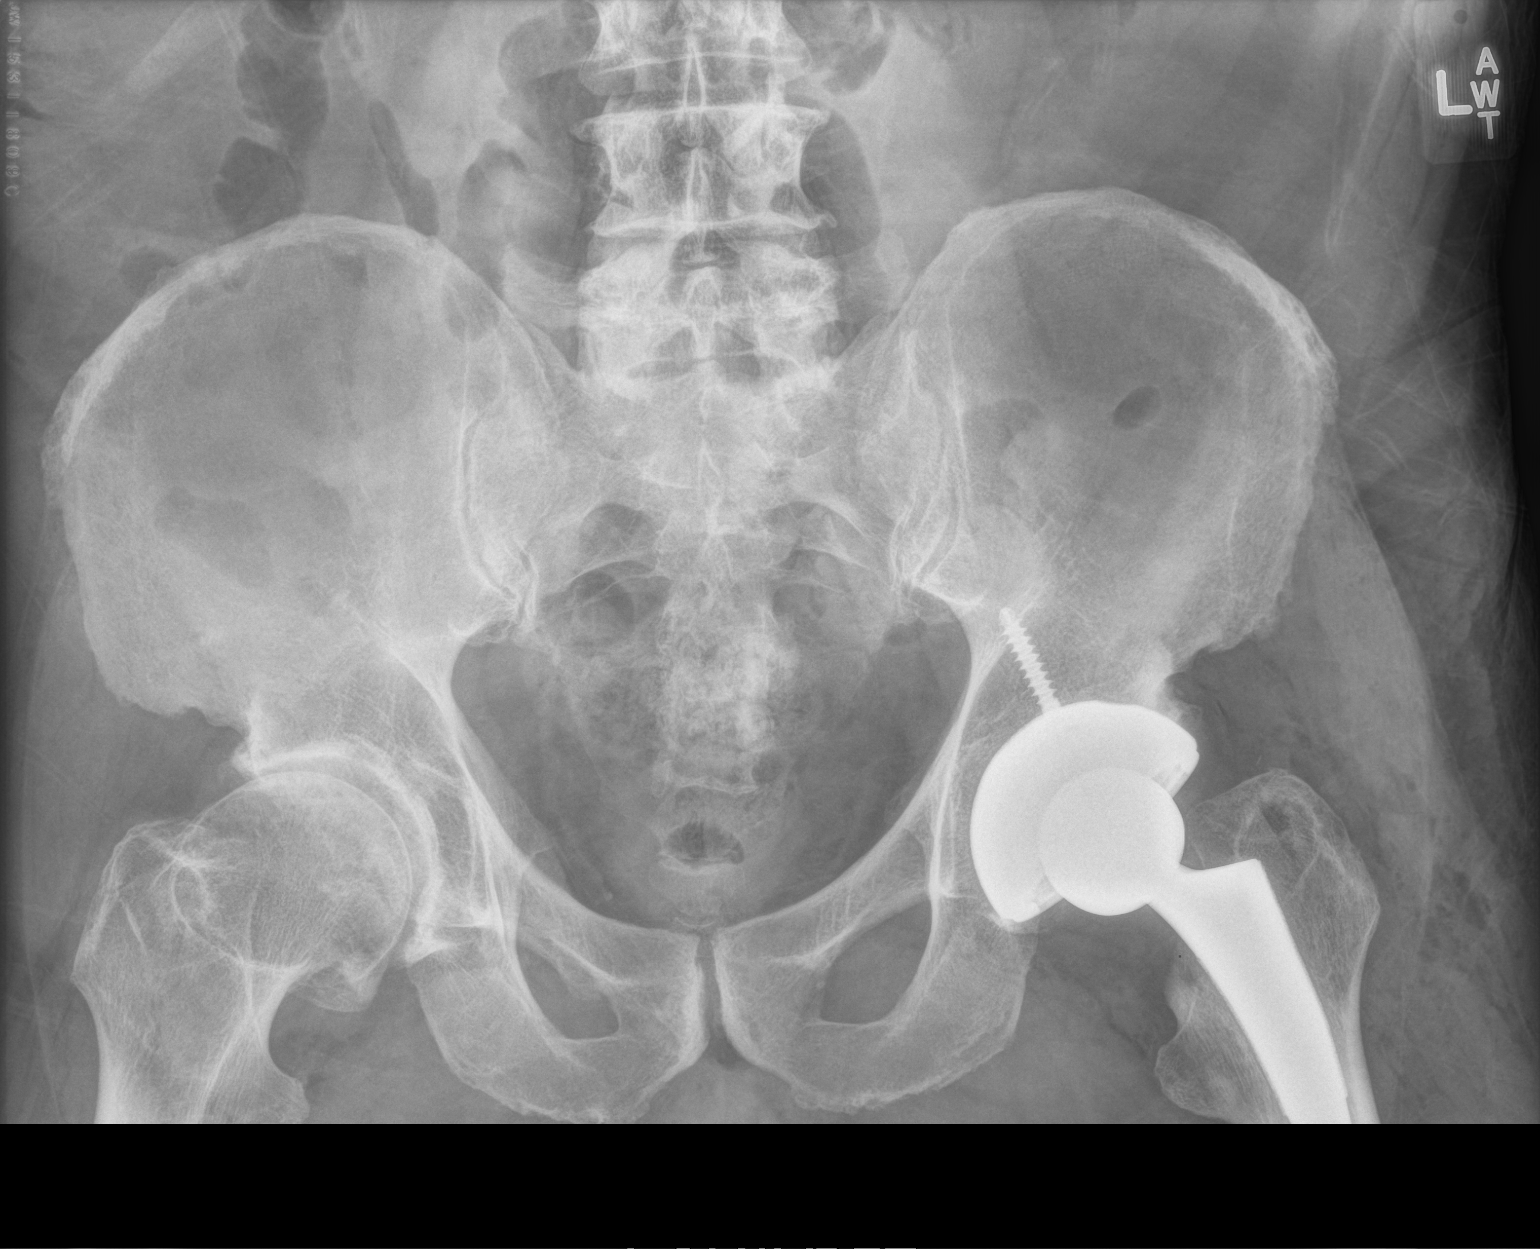

[pelvis ap (2 of 2)]
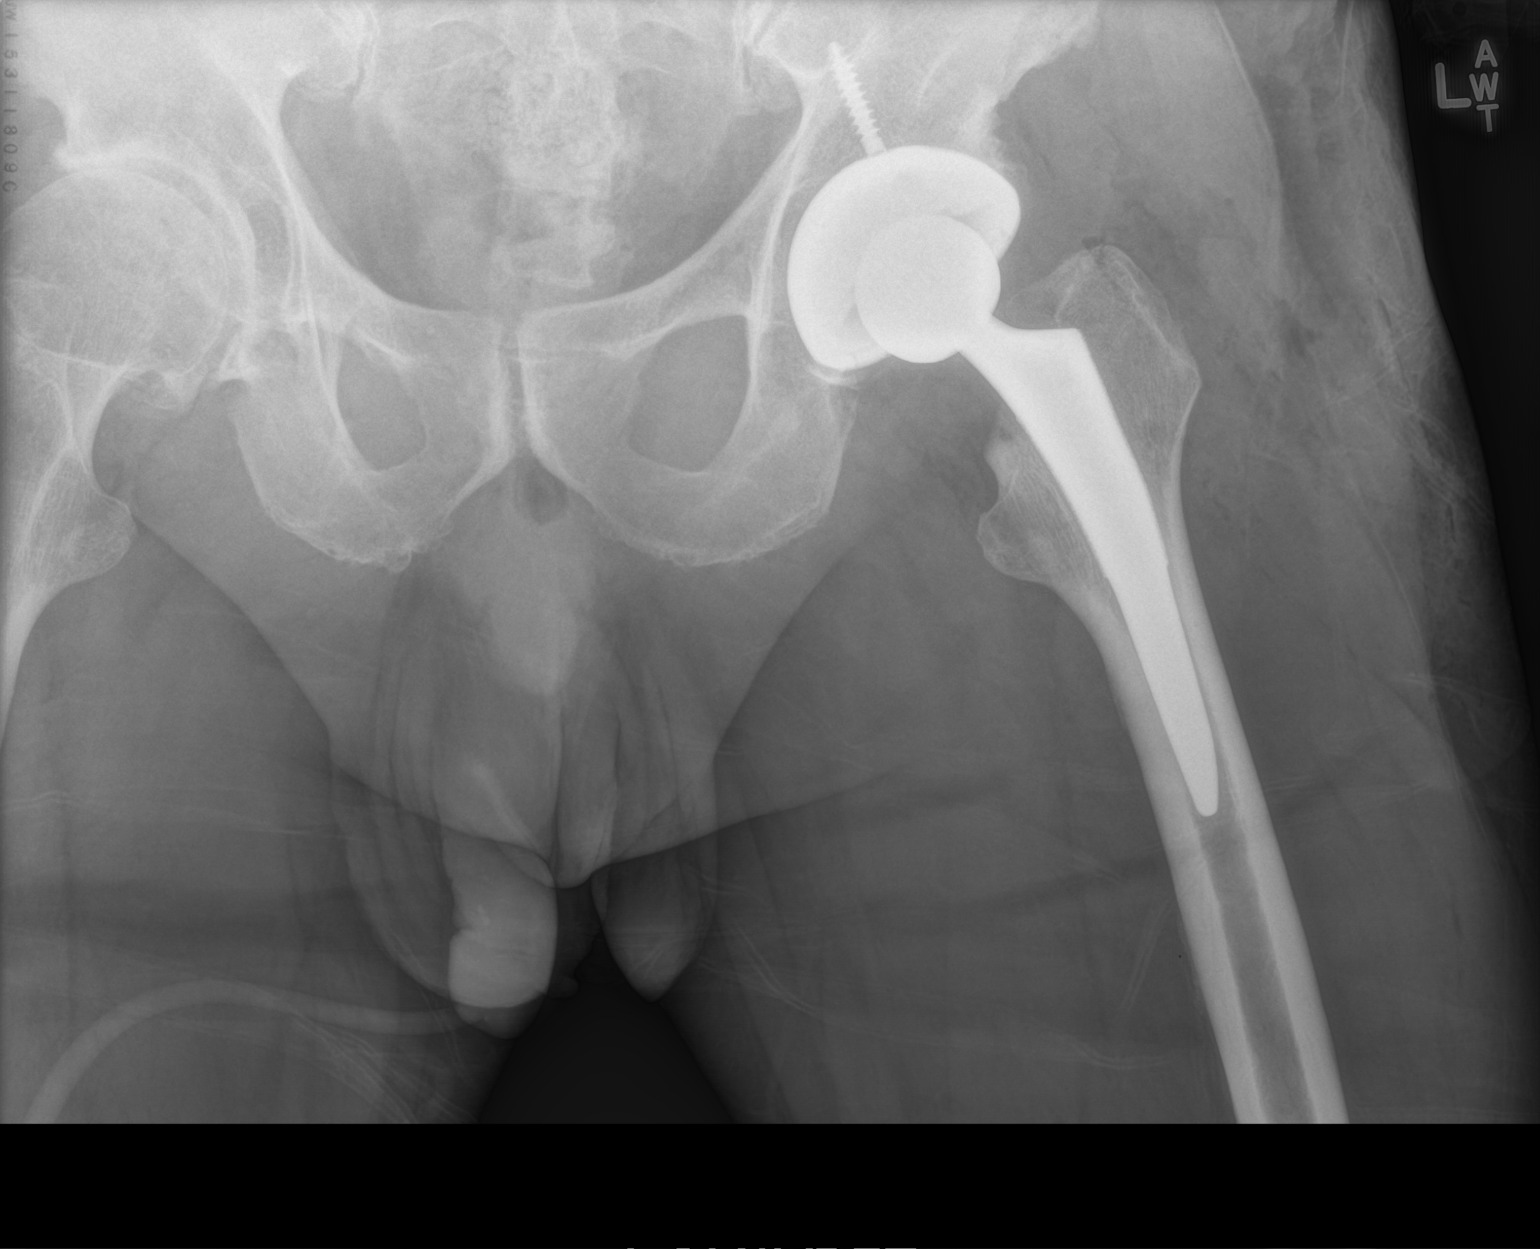

[2 of 2 positions shown; findings below may reference images not displayed]

FINDINGS: The patient is status post left total hip arthroplasty. The hardware
components are in anatomic alignment. No periprosthetic fracture or
dislocation identified. Moderate to advanced degenerative changes
are noted involving the right hip.
IMPRESSION: 1. No complications after left hip arthroplasty.

## 2016-01-21 DIAGNOSIS — M1611 Unilateral primary osteoarthritis, right hip: Secondary | ICD-10-CM | POA: Diagnosis not present

## 2016-01-21 DIAGNOSIS — Z471 Aftercare following joint replacement surgery: Secondary | ICD-10-CM | POA: Diagnosis not present

## 2016-01-21 DIAGNOSIS — Z96642 Presence of left artificial hip joint: Secondary | ICD-10-CM | POA: Diagnosis not present

## 2016-02-12 ENCOUNTER — Ambulatory Visit: Payer: Self-pay | Admitting: Orthopedic Surgery

## 2016-03-08 DIAGNOSIS — Z961 Presence of intraocular lens: Secondary | ICD-10-CM | POA: Diagnosis not present

## 2016-03-08 DIAGNOSIS — H35032 Hypertensive retinopathy, left eye: Secondary | ICD-10-CM | POA: Diagnosis not present

## 2016-03-08 DIAGNOSIS — H35031 Hypertensive retinopathy, right eye: Secondary | ICD-10-CM | POA: Diagnosis not present

## 2016-03-08 DIAGNOSIS — H26491 Other secondary cataract, right eye: Secondary | ICD-10-CM | POA: Diagnosis not present

## 2016-03-10 ENCOUNTER — Ambulatory Visit: Payer: Self-pay | Admitting: Orthopedic Surgery

## 2016-03-10 NOTE — H&P (Signed)
TOTAL HIP ADMISSION H&P  Patient is admitted for right total hip arthroplasty.  Subjective:  Chief Complaint: right hip pain  HPI: Cody [name], 70 y.o. male, has a history of pain and functional disability in the right hip(s) due to arthritis and patient has failed non-surgical conservative treatments for greater than 12 weeks to include NSAID's and/or analgesics, flexibility and strengthening excercises, use of assistive devices, weight reduction as appropriate and activity modification.  Onset of symptoms was gradual starting 2 years ago with rapidlly worsening course since that time.The patient noted no past surgery on the right hip(s).  Patient currently rates pain in the right hip at 10 out of 10 with activity. Patient has night pain, worsening of pain with activity and weight bearing, pain that interfers with activities of daily living and pain with passive range of motion. Patient has evidence of subchondral cysts, subchondral sclerosis, periarticular osteophytes, joint subluxation and joint space narrowing by imaging studies. This condition presents safety issues increasing the risk of falls. There is no current active infection.  Patient Active Problem List   Diagnosis Date Noted  . Osteoarthritis of left hip 09/04/2015  . Hyperlipidemia 06/20/2015  . SLEEP APNEA, OBSTRUCTIVE 01/21/2009  . RHINOCONJUNCTIVITIS, ALLERGIC 01/21/2009   Past Medical History  Diagnosis Date  . History of shingles   . Sleep apnea, obstructive     cpap  . Hyperlipidemia   . Environmental allergies   . Headache     "bad sinus headache"  . Arthritis   . History of skin cancer   . GERD (gastroesophageal reflux disease)   . Nocturia   . Constipation   . Anxiety     Past Surgical History  Procedure Laterality Date  . Eye surgery  2016    cataracts removed  . Total hip arthroplasty Left 09/04/2015    Procedure: LEFT TOTAL HIP ARTHROPLASTY ANTERIOR APPROACH;  Surgeon: Rod Can, MD;   Location: [facility];  Service: Orthopedics;  Laterality: Left;     (Not in a hospital admission) Allergies  Allergen Reactions  . Codeine Other (See Comments)    "Can't take in large doses"  . Penicillins Other (See Comments)    Unknown, as a child Has patient had a PCN reaction causing immediate rash, facial/tongue/throat swelling, SOB or lightheadedness with hypotension: unknown - childhood reaction Has patient had a PCN reaction causing severe rash involving mucus membranes or skin necrosis: unknown - childhood reaction Has patient had a PCN reaction that required hospitalization no - never hospitalized Has patient had a PCN reaction occurring within the last 10 years: no If all of the above answers are "NO", then may p    Social History  Substance Use Topics  . Smoking status: Former Smoker    Quit date: 08/24/1982  . Smokeless tobacco: Not on file  . Alcohol Use: 0.0 oz/week    0 Standard drinks or equivalent per week     Comment: 1 drink daily    Family History  Problem Relation Age of Onset  . Heart failure Mother   . Heart Problems Mother     pacer  . Stroke Father   . Hypertension Sister   . Other Sister     healthty  . Other Son     healthy  . Other Son     healthy     Review of Systems  Constitutional: Positive for malaise/fatigue. Negative for fever, chills, weight loss and diaphoresis.  HENT: Positive for hearing loss and tinnitus.  Eyes: Negative.   Respiratory: Negative.   Cardiovascular: Negative.   Gastrointestinal: Positive for heartburn and constipation.  Genitourinary: Negative.   Musculoskeletal: Positive for joint pain.  Skin: Negative.   Neurological: Positive for headaches. Negative for dizziness, tingling, tremors, sensory change, speech change, focal weakness, seizures, loss of consciousness and weakness.  Endo/Heme/Allergies: Negative.   Psychiatric/Behavioral: The patient has insomnia.     Objective:  Physical Exam  Vitals  reviewed. Constitutional: He is oriented to person, place, and time. He appears well-developed and well-nourished.  HENT:  Head: Normocephalic and atraumatic.  Eyes: Conjunctivae and EOM are normal. Pupils are equal, round, and reactive to light.  Neck: Normal range of motion. Neck supple.  Cardiovascular: Normal rate, regular rhythm and normal heart sounds.   Respiratory: Effort normal and breath sounds normal. No respiratory distress.  GI: Soft. Bowel sounds are normal. He exhibits no distension.  Genitourinary:  deferred  Musculoskeletal:       Left hip: He exhibits decreased strength and crepitus.  Neurological: He is alert and oriented to person, place, and time. He has normal reflexes.  Skin: Skin is warm and dry.  Psychiatric: He has a normal mood and affect. His behavior is normal. Judgment and thought content normal.    Vital signs in last 24 hours: @VSRANGES @  Labs:   Estimated body mass index is 34.60 kg/(m^2) as calculated from the following:   Height as of 09/04/15: 5\' 11"  (1.803 m).   Weight as of 08/25/15: 112.492 kg (248 lb).   Imaging Review Plain radiographs demonstrate severe degenerative joint disease of the right hip(s). The bone quality appears to be adequate for age and reported activity level.  Assessment/Plan:  End stage arthritis, right hip(s)  The patient history, physical examination, clinical judgement of the provider and imaging studies are consistent with end stage degenerative joint disease of the right hip(s) and total hip arthroplasty is deemed medically necessary. The treatment options including medical management, injection therapy, arthroscopy and arthroplasty were discussed at length. The risks and benefits of total hip arthroplasty were presented and reviewed. The risks due to aseptic loosening, infection, stiffness, dislocation/subluxation,  thromboembolic complications and other imponderables were discussed.  The patient acknowledged the  explanation, agreed to proceed with the plan and consent was signed. Patient is being admitted for inpatient treatment for surgery, pain control, PT, OT, prophylactic antibiotics, VTE prophylaxis, progressive ambulation and ADL's and discharge planning.The patient is planning to be discharged home with home health services

## 2016-03-18 ENCOUNTER — Encounter (HOSPITAL_COMMUNITY): Payer: Self-pay

## 2016-03-18 ENCOUNTER — Encounter (HOSPITAL_COMMUNITY)
Admission: RE | Admit: 2016-03-18 | Discharge: 2016-03-18 | Disposition: A | Discharge status: Home / Self Care | Payer: PPO | Source: Ambulatory Visit | Attending: Orthopedic Surgery | Admitting: Orthopedic Surgery

## 2016-03-18 DIAGNOSIS — M1611 Unilateral primary osteoarthritis, right hip: Secondary | ICD-10-CM | POA: Insufficient documentation

## 2016-03-18 DIAGNOSIS — Z01812 Encounter for preprocedural laboratory examination: Secondary | ICD-10-CM | POA: Insufficient documentation

## 2016-03-18 DIAGNOSIS — Z0183 Encounter for blood typing: Secondary | ICD-10-CM | POA: Diagnosis not present

## 2016-03-18 HISTORY — DX: Malignant (primary) neoplasm, unspecified: C80.1

## 2016-03-18 LAB — CBC
HEMATOCRIT: 38 % — AB (ref 39.0–52.0)
Hemoglobin: 13.8 g/dL (ref 13.0–17.0)
MCH: 29.7 pg (ref 26.0–34.0)
MCHC: 36.3 g/dL — ABNORMAL HIGH (ref 30.0–36.0)
MCV: 81.9 fL (ref 78.0–100.0)
PLATELETS: 197 10*3/uL (ref 150–400)
RBC: 4.64 MIL/uL (ref 4.22–5.81)
RDW: 13.2 % (ref 11.5–15.5)
WBC: 7 10*3/uL (ref 4.0–10.5)

## 2016-03-18 LAB — SURGICAL PCR SCREEN
MRSA, PCR: NEGATIVE
STAPHYLOCOCCUS AUREUS: NEGATIVE

## 2016-03-18 NOTE — Patient Instructions (Addendum)
Cody [name]  03/18/2016   Your procedure is scheduled on: 03/25/16  Report to [facility] Main  Entrance take [facility]  elevators to 3rd floor to  [place] at 9:00 AM.  Call this number if you have problems the morning of surgery [phone]   Remember: ONLY 1 PERSON MAY GO WITH YOU TO SHORT STAY TO GET  READY MORNING OF [place].  Do not eat food or drink liquids :After MidnightWED     Take these medicines the morning of surgery with A SIP OF WATER: none                                             Do not wear jewelry, lotions, powders or deodorant             You may brush your teeth the morning of surgery.   Do not bring valuables to the hospital. [place].  Contacts, dentures or bridgework may not be worn into surgery.  Leave suitcase in the car. After surgery it may be brought to your room.     _____________________________________________________________________             [facility] - Preparing for Surgery  Before surgery, you can play an important role.  Because skin is not sterile, your skin needs to be as free of germs as possible.  You can reduce the number of germs on you skin by washing with CHG (chlorahexidine gluconate) soap before surgery.  CHG is an antiseptic cleaner which kills germs and bonds with the skin to continue killing germs even after washing.  Please DO NOT use if you have an allergy to CHG or antibacterial soaps.  If your skin becomes reddened/irritated stop using the CHG and inform your nurse when you arrive at Short Stay.  Do not shave (including legs and underarms) for at least 48 hours prior to the first CHG shower.  You may shave your face.  Please follow these instructions carefully:   1.  Shower with CHG Soap the night before surgery and the                                morning of Surgery.  2.  If you choose to wash your hair, wash your hair first as  usual with your       normal shampoo.  3.  After you shampoo, rinse your hair and body thoroughly to remove the                      Shampoo.  4.  Use CHG as you would any other liquid soap.  You can apply chg directly       to the skin and wash gently with scrungie or a clean washcloth.  5.  Apply the CHG Soap to your body ONLY FROM THE NECK DOWN.        Do not use on open wounds or open sores.  Avoid contact with your eyes,       ears, mouth and genitals (private parts).  Wash genitals (private parts)  with your normal soap.  6.  Wash thoroughly, paying special attention to the area where your surgery        will be performed.  7.  Thoroughly rinse your body with warm water from the neck down.  8.  DO NOT shower/wash with your normal soap after using and rinsing off       the CHG Soap.  9.  Pat yourself dry with a clean towel.            10.  Wear clean pajamas.            11.  Place clean sheets on your bed the night of your first shower and do not        sleep with pets.  Day of Surgery  Do not apply any lotions/deoderants the morning of surgery.  Please wear clean clothes to the hospital/surgery center.    Incentive Spirometer  An incentive spirometer is a tool that can help keep your lungs clear and active. This tool measures how well you are filling your lungs with each breath. Taking long deep breaths may help reverse or decrease the chance of developing breathing (pulmonary) problems (especially infection) following:  A long period of time when you are unable to move or be active. BEFORE THE PROCEDURE   If the spirometer includes an indicator to show your best effort, your nurse or respiratory therapist will set it to a desired goal.  If possible, sit up straight or lean slightly forward. Try not to slouch.  Hold the incentive spirometer in an upright position. INSTRUCTIONS FOR USE  1. Sit on the edge of your bed if possible, or sit up as far as you can in bed or on a  chair. 2. Hold the incentive spirometer in an upright position. 3. Breathe out normally. 4. Place the mouthpiece in your mouth and seal your lips tightly around it. 5. Breathe in slowly and as deeply as possible, raising the piston or the ball toward the top of the column. 6. Hold your breath for 3-5 seconds or for as long as possible. Allow the piston or ball to fall to the bottom of the column. 7. Remove the mouthpiece from your mouth and breathe out normally. 8. Rest for a few seconds and repeat Steps 1 through 7 at least 10 times every 1-2 hours when you are awake. Take your time and take a few normal breaths between deep breaths. 9. The spirometer may include an indicator to show your best effort. Use the indicator as a goal to work toward during each repetition. 10. After each set of 10 deep breaths, practice coughing to be sure your lungs are clear. If you have an incision (the cut made at the time of surgery), support your incision when coughing by placing a pillow or rolled up towels firmly against it. Once you are able to get out of bed, walk around indoors and cough well. You may stop using the incentive spirometer when instructed by your caregiver.  RISKS AND COMPLICATIONS  Take your time so you do not get dizzy or light-headed.  If you are in pain, you may need to take or ask for pain medication before doing incentive spirometry. It is harder to take a deep breath if you are having pain. AFTER USE  Rest and breathe slowly and easily.  It can be helpful to keep track of a log of your progress. Your caregiver can provide you with a simple table to  help with this. If you are using the spirometer at home, follow these instructions: [place] IF:   You are having difficultly using the spirometer.  You have trouble using the spirometer as often as instructed.  Your pain medication is not giving enough relief while using the spirometer.  You develop fever of 100.5 F  (38.1 C) or higher. SEEK IMMEDIATE MEDICAL CARE IF:   You cough up bloody sputum that had not been present before.  You develop fever of 102 F (38.9 C) or greater.  You develop worsening pain at or near the incision site. MAKE SURE YOU:   Understand these instructions.  Will watch your condition.  Will get help right away if you are not doing well or get worse. Document Released: 02/21/2007 Document Revised: 01/03/2012 Document Reviewed: 04/24/2007 [facility] Patient Information 2014 [place], [place].   ________________________________________________________________________

## 2016-03-23 NOTE — Pre-Procedure Instructions (Signed)
Patient is aware that his surgery time changed to start at 11am on 03/25/16 and he will arrive at 9am.

## 2016-03-25 ENCOUNTER — Encounter (HOSPITAL_COMMUNITY)
Admission: RE | Disposition: A | Discharge status: Home Health | Payer: Self-pay | Source: Ambulatory Visit | Attending: Orthopedic Surgery

## 2016-03-25 ENCOUNTER — Inpatient Hospital Stay (HOSPITAL_COMMUNITY): Payer: PPO

## 2016-03-25 ENCOUNTER — Encounter (HOSPITAL_COMMUNITY): Payer: Self-pay | Admitting: Anesthesiology

## 2016-03-25 ENCOUNTER — Inpatient Hospital Stay (HOSPITAL_COMMUNITY)
Admission: RE | Admit: 2016-03-25 | Discharge: 2016-03-26 | DRG: 470 | Disposition: A | Discharge status: Home Health | Payer: PPO | Source: Ambulatory Visit | Attending: Orthopedic Surgery | Admitting: Orthopedic Surgery

## 2016-03-25 ENCOUNTER — Inpatient Hospital Stay (HOSPITAL_COMMUNITY): Payer: PPO | Admitting: Anesthesiology

## 2016-03-25 DIAGNOSIS — E785 Hyperlipidemia, unspecified: Secondary | ICD-10-CM | POA: Diagnosis not present

## 2016-03-25 DIAGNOSIS — Z96642 Presence of left artificial hip joint: Secondary | ICD-10-CM | POA: Diagnosis present

## 2016-03-25 DIAGNOSIS — Z79899 Other long term (current) drug therapy: Secondary | ICD-10-CM | POA: Diagnosis not present

## 2016-03-25 DIAGNOSIS — Z8619 Personal history of other infectious and parasitic diseases: Secondary | ICD-10-CM | POA: Diagnosis not present

## 2016-03-25 DIAGNOSIS — Z87891 Personal history of nicotine dependence: Secondary | ICD-10-CM | POA: Diagnosis not present

## 2016-03-25 DIAGNOSIS — K219 Gastro-esophageal reflux disease without esophagitis: Secondary | ICD-10-CM | POA: Diagnosis not present

## 2016-03-25 DIAGNOSIS — Z471 Aftercare following joint replacement surgery: Secondary | ICD-10-CM | POA: Diagnosis not present

## 2016-03-25 DIAGNOSIS — M1611 Unilateral primary osteoarthritis, right hip: Secondary | ICD-10-CM | POA: Diagnosis not present

## 2016-03-25 DIAGNOSIS — Z09 Encounter for follow-up examination after completed treatment for conditions other than malignant neoplasm: Secondary | ICD-10-CM

## 2016-03-25 DIAGNOSIS — G4733 Obstructive sleep apnea (adult) (pediatric): Secondary | ICD-10-CM | POA: Diagnosis not present

## 2016-03-25 DIAGNOSIS — M25551 Pain in right hip: Secondary | ICD-10-CM | POA: Diagnosis not present

## 2016-03-25 DIAGNOSIS — Z96641 Presence of right artificial hip joint: Secondary | ICD-10-CM | POA: Diagnosis not present

## 2016-03-25 DIAGNOSIS — R52 Pain, unspecified: Secondary | ICD-10-CM

## 2016-03-25 HISTORY — PX: TOTAL HIP ARTHROPLASTY: SHX124

## 2016-03-25 LAB — TYPE AND SCREEN
ABO/RH(D): B POS
ANTIBODY SCREEN: NEGATIVE

## 2016-03-25 SURGERY — ARTHROPLASTY, HIP, TOTAL, ANTERIOR APPROACH
Anesthesia: Spinal | Site: Hip | Laterality: Right

## 2016-03-25 MED ORDER — PANTOPRAZOLE SODIUM 40 MG PO TBEC
40.0000 mg | DELAYED_RELEASE_TABLET | Freq: Every day | ORAL | Status: DC
  Administered 2016-03-25 – 2016-03-26 (×2): 40 mg via ORAL
  Filled 2016-03-25 (×2): qty 1

## 2016-03-25 MED ORDER — PROPOFOL 10 MG/ML IV BOLUS
INTRAVENOUS | Status: AC
  Filled 2016-03-25: qty 20

## 2016-03-25 MED ORDER — POVIDONE-IODINE 10 % EX SWAB: 2.0000 "application " | Freq: Once | CUTANEOUS | Status: DC

## 2016-03-25 MED ORDER — SODIUM CHLORIDE 0.9 % IR SOLN
Status: DC | PRN
  Administered 2016-03-25: 4000 mL

## 2016-03-25 MED ORDER — SODIUM CHLORIDE 0.9 % IJ SOLN
INTRAMUSCULAR | Status: AC
  Filled 2016-03-25: qty 50

## 2016-03-25 MED ORDER — METHOCARBAMOL 1000 MG/10ML IJ SOLN
500.0000 mg | Freq: Four times a day (QID) | INTRAVENOUS | Status: DC | PRN
  Administered 2016-03-25: 500 mg via INTRAVENOUS
  Filled 2016-03-25: qty 5
  Filled 2016-03-25: qty 550

## 2016-03-25 MED ORDER — DEXAMETHASONE SODIUM PHOSPHATE 10 MG/ML IJ SOLN
INTRAMUSCULAR | Status: DC | PRN
  Administered 2016-03-25: 10 mg via INTRAVENOUS

## 2016-03-25 MED ORDER — ACETAMINOPHEN 10 MG/ML IV SOLN
1000.0000 mg | INTRAVENOUS | Status: AC
  Administered 2016-03-25: 1000 mg via INTRAVENOUS
  Filled 2016-03-25: qty 100

## 2016-03-25 MED ORDER — MEPERIDINE HCL 50 MG/ML IJ SOLN: 6.2500 mg | INTRAMUSCULAR | Status: DC | PRN

## 2016-03-25 MED ORDER — CEFAZOLIN SODIUM-DEXTROSE 2-4 GM/100ML-% IV SOLN
2.0000 g | INTRAVENOUS | Status: AC
  Administered 2016-03-25: 2 g via INTRAVENOUS
  Filled 2016-03-25: qty 100

## 2016-03-25 MED ORDER — SODIUM CHLORIDE 0.9 % IJ SOLN
INTRAMUSCULAR | Status: DC | PRN
  Administered 2016-03-25: 30 mL

## 2016-03-25 MED ORDER — POVIDONE-IODINE 10 % EX SOLN
CUTANEOUS | Status: DC | PRN
  Administered 2016-03-25: 1 via TOPICAL

## 2016-03-25 MED ORDER — PHENOL 1.4 % MT LIQD: 1.0000 | OROMUCOSAL | Status: DC | PRN

## 2016-03-25 MED ORDER — BUPIVACAINE-EPINEPHRINE 0.25% -1:200000 IJ SOLN
INTRAMUSCULAR | Status: AC
  Filled 2016-03-25: qty 1

## 2016-03-25 MED ORDER — DEXAMETHASONE SODIUM PHOSPHATE 10 MG/ML IJ SOLN
10.0000 mg | Freq: Once | INTRAMUSCULAR | Status: AC
  Administered 2016-03-26: 10 mg via INTRAVENOUS
  Filled 2016-03-25: qty 1

## 2016-03-25 MED ORDER — CEFAZOLIN SODIUM-DEXTROSE 2-3 GM-% IV SOLR
INTRAVENOUS | Status: DC | PRN
Start: 2016-03-25 — End: 2016-03-25
  Administered 2016-03-25: 2 g via INTRAVENOUS

## 2016-03-25 MED ORDER — SORBITOL 70 % SOLN
30.0000 mL | Freq: Every day | Status: DC | PRN
  Filled 2016-03-25: qty 30

## 2016-03-25 MED ORDER — CEFAZOLIN SODIUM-DEXTROSE 2-4 GM/100ML-% IV SOLN
INTRAVENOUS | Status: AC
  Filled 2016-03-25: qty 100

## 2016-03-25 MED ORDER — METOCLOPRAMIDE HCL 10 MG PO TABS: 5.0000 mg | ORAL_TABLET | Freq: Three times a day (TID) | ORAL | Status: DC | PRN

## 2016-03-25 MED ORDER — HYDROCODONE-ACETAMINOPHEN 5-325 MG PO TABS
1.0000 | ORAL_TABLET | ORAL | Status: DC | PRN
Start: 2016-03-25 — End: 2016-03-26
  Administered 2016-03-25 – 2016-03-26 (×5): 1 via ORAL
  Filled 2016-03-25 (×6): qty 1

## 2016-03-25 MED ORDER — OXYCODONE HCL 5 MG/5ML PO SOLN
5.0000 mg | Freq: Once | ORAL | Status: DC | PRN
  Filled 2016-03-25: qty 5

## 2016-03-25 MED ORDER — HYDROMORPHONE HCL 1 MG/ML IJ SOLN: 0.5000 mg | INTRAMUSCULAR | Status: DC | PRN

## 2016-03-25 MED ORDER — BUPIVACAINE-EPINEPHRINE 0.25% -1:200000 IJ SOLN
INTRAMUSCULAR | Status: DC | PRN
  Administered 2016-03-25: 30 mL

## 2016-03-25 MED ORDER — CHLORHEXIDINE GLUCONATE 4 % EX LIQD: 60.0000 mL | Freq: Once | CUTANEOUS | Status: DC

## 2016-03-25 MED ORDER — ASPIRIN EC 325 MG PO TBEC
325.0000 mg | DELAYED_RELEASE_TABLET | Freq: Two times a day (BID) | ORAL | Status: DC
  Administered 2016-03-26: 325 mg via ORAL
  Filled 2016-03-25 (×3): qty 1

## 2016-03-25 MED ORDER — PROPOFOL 10 MG/ML IV BOLUS
INTRAVENOUS | Status: AC
  Filled 2016-03-25: qty 40

## 2016-03-25 MED ORDER — TRANEXAMIC ACID 1000 MG/10ML IV SOLN
1000.0000 mg | INTRAVENOUS | Status: AC
  Administered 2016-03-25: 1000 mg via INTRAVENOUS
  Filled 2016-03-25: qty 10

## 2016-03-25 MED ORDER — METHOCARBAMOL 500 MG PO TABS
500.0000 mg | ORAL_TABLET | Freq: Four times a day (QID) | ORAL | Status: DC | PRN
  Administered 2016-03-25 – 2016-03-26 (×3): 500 mg via ORAL
  Filled 2016-03-25 (×3): qty 1

## 2016-03-25 MED ORDER — 0.9 % SODIUM CHLORIDE (POUR BTL) OPTIME
TOPICAL | Status: DC | PRN
  Administered 2016-03-25: 1000 mL

## 2016-03-25 MED ORDER — ACETAMINOPHEN 325 MG PO TABS
650.0000 mg | ORAL_TABLET | Freq: Four times a day (QID) | ORAL | Status: DC | PRN
  Administered 2016-03-25: 650 mg via ORAL
  Filled 2016-03-25: qty 2

## 2016-03-25 MED ORDER — FENTANYL CITRATE (PF) 100 MCG/2ML IJ SOLN
INTRAMUSCULAR | Status: DC | PRN
  Administered 2016-03-25: 25 ug via INTRAVENOUS
  Administered 2016-03-25: 50 ug via INTRAVENOUS
  Administered 2016-03-25: 25 ug via INTRAVENOUS

## 2016-03-25 MED ORDER — KETOROLAC TROMETHAMINE 30 MG/ML IJ SOLN
INTRAMUSCULAR | Status: DC | PRN
  Administered 2016-03-25: 30 mg

## 2016-03-25 MED ORDER — DOCUSATE SODIUM 100 MG PO CAPS
100.0000 mg | ORAL_CAPSULE | Freq: Two times a day (BID) | ORAL | Status: DC
  Administered 2016-03-25 – 2016-03-26 (×2): 100 mg via ORAL
  Filled 2016-03-25 (×3): qty 1

## 2016-03-25 MED ORDER — ISOPROPYL ALCOHOL 70 % SOLN
Status: DC | PRN
  Administered 2016-03-25: 1 via TOPICAL

## 2016-03-25 MED ORDER — OXYCODONE HCL 5 MG PO TABS: 5.0000 mg | ORAL_TABLET | Freq: Once | ORAL | Status: DC | PRN

## 2016-03-25 MED ORDER — LACTATED RINGERS IV SOLN
INTRAVENOUS | Status: DC | PRN
  Administered 2016-03-25 (×2): via INTRAVENOUS

## 2016-03-25 MED ORDER — SODIUM CHLORIDE 0.9 % IV SOLN
INTRAVENOUS | Status: DC
  Administered 2016-03-25: 22:00:00 via INTRAVENOUS
  Administered 2016-03-25: 1000 mL via INTRAVENOUS

## 2016-03-25 MED ORDER — METOCLOPRAMIDE HCL 5 MG/ML IJ SOLN: 5.0000 mg | Freq: Three times a day (TID) | INTRAMUSCULAR | Status: DC | PRN

## 2016-03-25 MED ORDER — MENTHOL 3 MG MT LOZG
1.0000 | LOZENGE | OROMUCOSAL | Status: DC | PRN
  Filled 2016-03-25: qty 9

## 2016-03-25 MED ORDER — MIDAZOLAM HCL 2 MG/2ML IJ SOLN
INTRAMUSCULAR | Status: AC
  Filled 2016-03-25: qty 2

## 2016-03-25 MED ORDER — ACETAMINOPHEN 10 MG/ML IV SOLN
INTRAVENOUS | Status: AC
  Filled 2016-03-25: qty 100

## 2016-03-25 MED ORDER — HYDROGEN PEROXIDE 3 % EX SOLN
CUTANEOUS | Status: DC | PRN
  Administered 2016-03-25: 1 via TOPICAL

## 2016-03-25 MED ORDER — BUPIVACAINE HCL (PF) 0.5 % IJ SOLN
INTRAMUSCULAR | Status: AC
  Filled 2016-03-25: qty 30

## 2016-03-25 MED ORDER — FLUTICASONE PROPIONATE 50 MCG/ACT NA SUSP
2.0000 | Freq: Every day | NASAL | Status: DC | PRN
  Filled 2016-03-25: qty 16

## 2016-03-25 MED ORDER — ONDANSETRON HCL 4 MG/2ML IJ SOLN
INTRAMUSCULAR | Status: DC | PRN
  Administered 2016-03-25: 4 mg via INTRAVENOUS

## 2016-03-25 MED ORDER — PROPOFOL 500 MG/50ML IV EMUL
INTRAVENOUS | Status: DC | PRN
  Administered 2016-03-25: 75 ug/kg/min via INTRAVENOUS

## 2016-03-25 MED ORDER — ONDANSETRON HCL 4 MG PO TABS: 4.0000 mg | ORAL_TABLET | Freq: Four times a day (QID) | ORAL | Status: DC | PRN

## 2016-03-25 MED ORDER — ONDANSETRON HCL 4 MG/2ML IJ SOLN: 4.0000 mg | Freq: Four times a day (QID) | INTRAMUSCULAR | Status: DC | PRN

## 2016-03-25 MED ORDER — ACETAMINOPHEN 650 MG RE SUPP: 650.0000 mg | Freq: Four times a day (QID) | RECTAL | Status: DC | PRN

## 2016-03-25 MED ORDER — SENNA 8.6 MG PO TABS
2.0000 | ORAL_TABLET | Freq: Every day | ORAL | Status: DC
  Administered 2016-03-25: 17.2 mg via ORAL
  Filled 2016-03-25: qty 2

## 2016-03-25 MED ORDER — POLYVINYL ALCOHOL 1.4 % OP SOLN
1.0000 [drp] | Freq: Every day | OPHTHALMIC | Status: DC | PRN
  Administered 2016-03-25: 1 [drp] via OPHTHALMIC
  Filled 2016-03-25: qty 15

## 2016-03-25 MED ORDER — MAGNESIUM CITRATE PO SOLN: 1.0000 | Freq: Once | ORAL | Status: DC | PRN

## 2016-03-25 MED ORDER — TRANEXAMIC ACID 1000 MG/10ML IV SOLN
1000.0000 mg | Freq: Once | INTRAVENOUS | Status: AC
  Administered 2016-03-25: 1000 mg via INTRAVENOUS
  Filled 2016-03-25: qty 10

## 2016-03-25 MED ORDER — ATORVASTATIN CALCIUM 20 MG PO TABS
20.0000 mg | ORAL_TABLET | Freq: Every day | ORAL | Status: DC
  Administered 2016-03-25: 20 mg via ORAL
  Filled 2016-03-25 (×2): qty 1

## 2016-03-25 MED ORDER — ISOPROPYL ALCOHOL 70 % SOLN
Status: AC
  Filled 2016-03-25: qty 480

## 2016-03-25 MED ORDER — KETOROLAC TROMETHAMINE 30 MG/ML IJ SOLN
INTRAMUSCULAR | Status: AC
  Filled 2016-03-25: qty 1

## 2016-03-25 MED ORDER — CEFAZOLIN SODIUM-DEXTROSE 2-4 GM/100ML-% IV SOLN
2.0000 g | Freq: Four times a day (QID) | INTRAVENOUS | Status: AC
  Administered 2016-03-25 – 2016-03-26 (×2): 2 g via INTRAVENOUS
  Filled 2016-03-25 (×2): qty 100

## 2016-03-25 MED ORDER — HYDROGEN PEROXIDE 3 % EX SOLN
CUTANEOUS | Status: AC
  Filled 2016-03-25: qty 473

## 2016-03-25 MED ORDER — WATER FOR IRRIGATION, STERILE IR SOLN
Status: DC | PRN
  Administered 2016-03-25: 2000 mL

## 2016-03-25 MED ORDER — HYDROMORPHONE HCL 1 MG/ML IJ SOLN
0.2500 mg | INTRAMUSCULAR | Status: DC | PRN
  Administered 2016-03-25: 0.5 mg via INTRAVENOUS

## 2016-03-25 MED ORDER — MIDAZOLAM HCL 5 MG/5ML IJ SOLN
INTRAMUSCULAR | Status: DC | PRN
  Administered 2016-03-25: 2 mg via INTRAVENOUS

## 2016-03-25 MED ORDER — KETOROLAC TROMETHAMINE 15 MG/ML IJ SOLN
7.5000 mg | Freq: Four times a day (QID) | INTRAMUSCULAR | Status: AC
  Administered 2016-03-25 – 2016-03-26 (×4): 7.5 mg via INTRAVENOUS
  Filled 2016-03-25 (×5): qty 1

## 2016-03-25 MED ORDER — HYDROMORPHONE HCL 1 MG/ML IJ SOLN
INTRAMUSCULAR | Status: AC
  Filled 2016-03-25: qty 1

## 2016-03-25 MED ORDER — FENTANYL CITRATE (PF) 100 MCG/2ML IJ SOLN
INTRAMUSCULAR | Status: AC
  Filled 2016-03-25: qty 2

## 2016-03-25 MED ORDER — BUPIVACAINE IN DEXTROSE 0.75-8.25 % IT SOLN
INTRATHECAL | Status: DC | PRN
  Administered 2016-03-25: 2 mL via INTRATHECAL

## 2016-03-25 SURGICAL SUPPLY — 47 items
BAG SPEC THK2 15X12 ZIP CLS (MISCELLANEOUS) ×1
BAG ZIPLOCK 12X15 (MISCELLANEOUS) ×1 IMPLANT
CAPT HIP TOTAL 2 ×1 IMPLANT
CHLORAPREP W/TINT 26ML (MISCELLANEOUS) ×2 IMPLANT
CLOTH BEACON ORANGE TIMEOUT ST (SAFETY) ×2 IMPLANT
COVER PERINEAL POST (MISCELLANEOUS) ×2 IMPLANT
DECANTER SPIKE VIAL GLASS SM (MISCELLANEOUS) ×2 IMPLANT
DRAPE LG THREE QUARTER DISP (DRAPES) ×4 IMPLANT
DRAPE STERI IOBAN 125X83 (DRAPES) ×2 IMPLANT
DRAPE U-SHAPE 47X51 STRL (DRAPES) ×4 IMPLANT
DRSG AQUACEL AG ADV 3.5X10 (GAUZE/BANDAGES/DRESSINGS) ×2 IMPLANT
ELECT REM PT RETURN 15FT ADLT (MISCELLANEOUS) ×2 IMPLANT
GAUZE SPONGE 4X4 12PLY STRL (GAUZE/BANDAGES/DRESSINGS) ×2 IMPLANT
GLOVE BIO SURGEON STRL SZ8.5 (GLOVE) ×4 IMPLANT
GLOVE BIOGEL PI IND STRL 6.5 (GLOVE) IMPLANT
GLOVE BIOGEL PI IND STRL 7.5 (GLOVE) IMPLANT
GLOVE BIOGEL PI IND STRL 8.5 (GLOVE) ×1 IMPLANT
GLOVE BIOGEL PI INDICATOR 6.5 (GLOVE) ×2
GLOVE BIOGEL PI INDICATOR 7.5 (GLOVE) ×5
GLOVE BIOGEL PI INDICATOR 8.5 (GLOVE) ×1
GLOVE SURG SS PI 7.5 STRL IVOR (GLOVE) ×1 IMPLANT
GOWN SPEC L3 XXLG W/TWL (GOWN DISPOSABLE) ×3 IMPLANT
GOWN STRL REUS W/ TWL LRG LVL3 (GOWN DISPOSABLE) IMPLANT
GOWN STRL REUS W/TWL LRG LVL3 (GOWN DISPOSABLE) ×2
GOWN STRL REUS W/TWL XL LVL3 (GOWN DISPOSABLE) ×2 IMPLANT
HANDPIECE INTERPULSE COAX TIP (DISPOSABLE) ×2
HOLDER FOLEY CATH W/STRAP (MISCELLANEOUS) ×2 IMPLANT
HOOD PEEL AWAY FLYTE STAYCOOL (MISCELLANEOUS) ×4 IMPLANT
LIQUID BAND (GAUZE/BANDAGES/DRESSINGS) ×3 IMPLANT
MARKER SKIN DUAL TIP RULER LAB (MISCELLANEOUS) ×2 IMPLANT
NDL SPNL 18GX3.5 QUINCKE PK (NEEDLE) ×1 IMPLANT
NEEDLE SPNL 18GX3.5 QUINCKE PK (NEEDLE) ×2 IMPLANT
PACK ANTERIOR HIP CUSTOM (KITS) ×2 IMPLANT
SAW OSC TIP CART 19.5X105X1.3 (SAW) ×2 IMPLANT
SEALER BIPOLAR AQUA 6.0 (INSTRUMENTS) ×2 IMPLANT
SET HNDPC FAN SPRY TIP SCT (DISPOSABLE) ×1 IMPLANT
SOL PREP POV-IOD 4OZ 10% (MISCELLANEOUS) ×2 IMPLANT
SUT ETHIBOND NAB CT1 #1 30IN (SUTURE) ×4 IMPLANT
SUT MNCRL AB 3-0 PS2 18 (SUTURE) ×2 IMPLANT
SUT MON AB 2-0 CT1 36 (SUTURE) ×4 IMPLANT
SUT VIC AB 1 CT1 36 (SUTURE) ×2 IMPLANT
SUT VIC AB 2-0 CT1 27 (SUTURE) ×2
SUT VIC AB 2-0 CT1 TAPERPNT 27 (SUTURE) ×1 IMPLANT
SUT VLOC 180 0 24IN GS25 (SUTURE) ×2 IMPLANT
SYR 50ML LL SCALE MARK (SYRINGE) ×2 IMPLANT
TRAY FOLEY W/METER SILVER 16FR (SET/KITS/TRAYS/PACK) ×1 IMPLANT
YANKAUER SUCT BULB TIP 10FT TU (MISCELLANEOUS) ×2 IMPLANT

## 2016-03-25 NOTE — Discharge Summary (Signed)
Physician Discharge Summary  Patient ID: Cody [name] MRN: [id number] DOB/AGE: 06/16/1946 70 y.o.  Admit date: 03/25/2016 Discharge date: 03/26/2016  Admission Diagnoses:  Primary osteoarthritis of right hip  Discharge Diagnoses:  Principal Problem:   Primary osteoarthritis of right hip   Past Medical History  Diagnosis Date  . History of shingles   . Sleep apnea, obstructive     cpap  . Hyperlipidemia   . Environmental allergies   . Headache     "bad sinus headache"  . Arthritis   . History of skin cancer   . GERD (gastroesophageal reflux disease)   . Nocturia   . Constipation   . Anxiety   . Cancer [facility])     Surgeries: Procedure(s): RIGHT TOTAL HIP ARTHROPLASTY ANTERIOR APPROACH on 03/25/2016   Consultants (if any):    Discharged Condition: Improved  Hospital Course: Cody [name] is an 70 y.o. male who was admitted 03/25/2016 with a diagnosis of Primary osteoarthritis of right hip and went to the operating room on 03/25/2016 and underwent the above named procedures.    He was given perioperative antibiotics:      Anti-infectives    Start     Dose/Rate Route Frequency Ordered Stop   03/25/16 2030  ceFAZolin (ANCEF) IVPB 2g/100 mL premix     2 g 200 mL/hr over 30 Minutes Intravenous Every 6 hours 03/25/16 1536 03/26/16 0230   03/25/16 0930  ceFAZolin (ANCEF) IVPB 2g/100 mL premix     2 g 200 mL/hr over 30 Minutes Intravenous On call to O.R. 03/25/16 [id number] 03/25/16 1135    .  He was given sequential compression devices, early ambulation, and ASA for DVT prophylaxis.  He benefited maximally from the hospital stay and there were no complications.    Recent vital signs:  Filed Vitals:   03/26/16 1036 03/26/16 1434  BP: 134/78 146/67  Pulse: 83 83  Temp: 97.6 F (36.4 C) 98.3 F (36.8 C)  Resp: 16 16    Recent laboratory studies:  Lab Results  Component Value Date   HGB 12.0* 03/26/2016   HGB 13.8 03/18/2016   HGB 11.7* 09/06/2015   Lab Results   Component Value Date   WBC 15.3* 03/26/2016   PLT 194 03/26/2016   Lab Results  Component Value Date   INR 1.06 08/25/2015   Lab Results  Component Value Date   NA 139 03/26/2016   K 4.3 03/26/2016   CL 109 03/26/2016   CO2 25 03/26/2016   BUN 18 03/26/2016   CREATININE 1.19 03/26/2016   GLUCOSE 144* 03/26/2016    Discharge Medications:     Medication List    STOP taking these medications        omeprazole 20 MG capsule  Commonly known as:  PRILOSEC      TAKE these medications        aspirin EC 325 MG tablet  Take 1 tablet (325 mg total) by mouth 2 (two) times daily after a meal.     atorvastatin 20 MG tablet  Commonly known as:  LIPITOR  Take 20 mg by mouth daily after supper.     docusate sodium 100 MG capsule  Commonly known as:  COLACE  Take 1 capsule (100 mg total) by mouth 2 (two) times daily.     fluticasone 50 MCG/ACT nasal spray  Commonly known as:  FLONASE  Place 2 sprays into both nostrils daily as needed for allergies.     HYDROcodone-acetaminophen 5-325 MG tablet  Commonly known as:  NORCO  Take 1-2 tablets by mouth every 4 (four) hours as needed for moderate pain.     meloxicam 15 MG tablet  Commonly known as:  MOBIC  Take 1 tablet (15 mg total) by mouth daily.     omeprazole 20 MG tablet  Commonly known as:  PRILOSEC OTC  Take 1 tablet (20 mg total) by mouth 2 (two) times daily before a meal.     ondansetron 4 MG tablet  Commonly known as:  ZOFRAN  Take 1 tablet (4 mg total) by mouth every 6 (six) hours as needed for nausea.     senna 8.6 MG Tabs tablet  Commonly known as:  SENOKOT  Take 2 tablets (17.2 mg total) by mouth at bedtime.     SYSTANE ULTRA OP  Place 1 drop into both eyes daily as needed (For dry eyes.).        Diagnostic Studies: Dg Pelvis Portable  03/25/2016  CLINICAL DATA:  Postop right hip replacement. EXAM: PORTABLE PELVIS 1-2 VIEWS COMPARISON:  09/04/2015 FINDINGS: The single AP view of the pelvis and proximal  femora demonstrate the new right hip total arthroplasty to be well seated and well aligned. There is no acute fracture or evidence of an operative complication. IMPRESSION: Well-positioned right hip arthroplasty. Electronically Signed   By: Lajean Manes M.D.   On: 03/25/2016 14:53   Dg C-arm 61-120 Min-no Report  03/25/2016  CLINICAL DATA: surgery C-ARM 61-120 MINUTES Fluoroscopy was utilized by the requesting physician.  No radiographic interpretation.   Dg Hip Operative Unilat With Pelvis Right  03/25/2016  CLINICAL DATA:  Right hip replacement today. Intraoperative imaging. Initial encounter. EXAM: OPERATIVE RIGHT HIP (WITH PELVIS IF PERFORMED) 2 VIEWS TECHNIQUE: Fluoroscopic spot image(s) were submitted for interpretation post-operatively. COMPARISON:  Single-view of the pelvis 09/04/2015. FINDINGS: We are provided with 2 fluoroscopic spot views of the pelvis and right hip. New right total hip replacement is in place. The device is located. No fracture is identified. Left hip replacement is seen as on the prior examination. IMPRESSION: Status post right total hip replacement.  No acute abnormality. Electronically Signed   By: Inge Rise M.D.   On: 03/25/2016 14:13    Disposition: 06-Home-Health Care Svc  Discharge Instructions    Call MD / Call 911    Complete by:  As directed   If you experience chest pain or shortness of breath, CALL 911 and be transported to the hospital emergency room.  If you develope a fever above 101 F, pus (white drainage) or increased drainage or redness at the wound, or calf pain, call your surgeon's office.     Constipation Prevention    Complete by:  As directed   Drink plenty of fluids.  Prune juice may be helpful.  You may use a stool softener, such as Colace (over the counter) 100 mg twice a day.  Use MiraLax (over the counter) for constipation as needed.     Diet - low sodium heart healthy    Complete by:  As directed      Driving restrictions    Complete  by:  As directed   No driving for 6 weeks     Increase activity slowly as tolerated    Complete by:  As directed      Lifting restrictions    Complete by:  As directed   No lifting for 6 weeks     TED hose    Complete by:  As directed   Use stockings (TED hose) for 2 weeks on both leg(s).  You may remove them at night for sleeping.           Follow-up Information    Follow up with Andriy Sherk, Horald Pollen, MD. Schedule an appointment as soon as possible for a visit in 2 weeks.   Specialty:  Orthopedic Surgery   Why:  For wound re-check   Contact information:   [address] [place] [place] [address] [phone]        Signed: Elie Goody 03/26/2016, 2:50 PM

## 2016-03-25 NOTE — Anesthesia Postprocedure Evaluation (Signed)
Anesthesia Post Note  Patient: Cody [name]  Procedure(s) Performed: Procedure(s) (LRB): RIGHT TOTAL HIP ARTHROPLASTY ANTERIOR APPROACH (Right)  Patient location during evaluation: PACU Anesthesia Type: General Level of consciousness: awake and alert Pain management: pain level controlled Vital Signs Assessment: post-procedure vital signs reviewed and stable Respiratory status: spontaneous breathing, nonlabored ventilation, respiratory function stable and patient connected to nasal cannula oxygen Cardiovascular status: blood pressure returned to baseline and stable Postop Assessment: no signs of nausea or vomiting Anesthetic complications: no    Last Vitals:  Filed Vitals:   03/25/16 1530 03/25/16 1545  BP: 110/65 110/65  Pulse: 66 66  Temp: 36.5 C 36.5 C  Resp: 16 16    Last Pain:  Filed Vitals:   03/25/16 1604  PainSc: 2                  Katina Remick A

## 2016-03-25 NOTE — H&P (View-Only) (Signed)
TOTAL HIP ADMISSION H&P  Patient is admitted for right total hip arthroplasty.  Subjective:  Chief Complaint: right hip pain  HPI: Cody [name], 70 y.o. male, has a history of pain and functional disability in the right hip(s) due to arthritis and patient has failed non-surgical conservative treatments for greater than 12 weeks to include NSAID's and/or analgesics, flexibility and strengthening excercises, use of assistive devices, weight reduction as appropriate and activity modification.  Onset of symptoms was gradual starting 2 years ago with rapidlly worsening course since that time.The patient noted no past surgery on the right hip(s).  Patient currently rates pain in the right hip at 10 out of 10 with activity. Patient has night pain, worsening of pain with activity and weight bearing, pain that interfers with activities of daily living and pain with passive range of motion. Patient has evidence of subchondral cysts, subchondral sclerosis, periarticular osteophytes, joint subluxation and joint space narrowing by imaging studies. This condition presents safety issues increasing the risk of falls. There is no current active infection.  Patient Active Problem List   Diagnosis Date Noted  . Osteoarthritis of left hip 09/04/2015  . Hyperlipidemia 06/20/2015  . SLEEP APNEA, OBSTRUCTIVE 01/21/2009  . RHINOCONJUNCTIVITIS, ALLERGIC 01/21/2009   Past Medical History  Diagnosis Date  . History of shingles   . Sleep apnea, obstructive     cpap  . Hyperlipidemia   . Environmental allergies   . Headache     "bad sinus headache"  . Arthritis   . History of skin cancer   . GERD (gastroesophageal reflux disease)   . Nocturia   . Constipation   . Anxiety     Past Surgical History  Procedure Laterality Date  . Eye surgery  2016    cataracts removed  . Total hip arthroplasty Left 09/04/2015    Procedure: LEFT TOTAL HIP ARTHROPLASTY ANTERIOR APPROACH;  Surgeon: Rod Can, MD;   Location: [facility] ORS;  Service: Orthopedics;  Laterality: Left;     (Not in a hospital admission) Allergies  Allergen Reactions  . Codeine Other (See Comments)    "Can't take in large doses"  . Penicillins Other (See Comments)    Unknown, as a child Has patient had a PCN reaction causing immediate rash, facial/tongue/throat swelling, SOB or lightheadedness with hypotension: unknown - childhood reaction Has patient had a PCN reaction causing severe rash involving mucus membranes or skin necrosis: unknown - childhood reaction Has patient had a PCN reaction that required hospitalization no - never hospitalized Has patient had a PCN reaction occurring within the last 10 years: no If all of the above answers are "NO", then may p    Social History  Substance Use Topics  . Smoking status: Former Smoker    Quit date: 08/24/1982  . Smokeless tobacco: Not on file  . Alcohol Use: 0.0 oz/week    0 Standard drinks or equivalent per week     Comment: 1 drink daily    Family History  Problem Relation Age of Onset  . Heart failure Mother   . Heart Problems Mother     pacer  . Stroke Father   . Hypertension Sister   . Other Sister     healthty  . Other Son     healthy  . Other Son     healthy     Review of Systems  Constitutional: Positive for malaise/fatigue. Negative for fever, chills, weight loss and diaphoresis.  HENT: Positive for hearing loss and tinnitus.  Eyes: Negative.   Respiratory: Negative.   Cardiovascular: Negative.   Gastrointestinal: Positive for heartburn and constipation.  Genitourinary: Negative.   Musculoskeletal: Positive for joint pain.  Skin: Negative.   Neurological: Positive for headaches. Negative for dizziness, tingling, tremors, sensory change, speech change, focal weakness, seizures, loss of consciousness and weakness.  Endo/Heme/Allergies: Negative.   Psychiatric/Behavioral: The patient has insomnia.     Objective:  Physical Exam  Vitals  reviewed. Constitutional: He is oriented to person, place, and time. He appears well-developed and well-nourished.  HENT:  Head: Normocephalic and atraumatic.  Eyes: Conjunctivae and EOM are normal. Pupils are equal, round, and reactive to light.  Neck: Normal range of motion. Neck supple.  Cardiovascular: Normal rate, regular rhythm and normal heart sounds.   Respiratory: Effort normal and breath sounds normal. No respiratory distress.  GI: Soft. Bowel sounds are normal. He exhibits no distension.  Genitourinary:  deferred  Musculoskeletal:       Left hip: He exhibits decreased strength and crepitus.  Neurological: He is alert and oriented to person, place, and time. He has normal reflexes.  Skin: Skin is warm and dry.  Psychiatric: He has a normal mood and affect. His behavior is normal. Judgment and thought content normal.    Vital signs in last 24 hours: @VSRANGES @  Labs:   Estimated body mass index is 34.60 kg/(m^2) as calculated from the following:   Height as of 09/04/15: 5\' 11"  (1.803 m).   Weight as of 08/25/15: 112.492 kg (248 lb).   Imaging Review Plain radiographs demonstrate severe degenerative joint disease of the right hip(s). The bone quality appears to be adequate for age and reported activity level.  Assessment/Plan:  End stage arthritis, right hip(s)  The patient history, physical examination, clinical judgement of the provider and imaging studies are consistent with end stage degenerative joint disease of the right hip(s) and total hip arthroplasty is deemed medically necessary. The treatment options including medical management, injection therapy, arthroscopy and arthroplasty were discussed at length. The risks and benefits of total hip arthroplasty were presented and reviewed. The risks due to aseptic loosening, infection, stiffness, dislocation/subluxation,  thromboembolic complications and other imponderables were discussed.  The patient acknowledged the  explanation, agreed to proceed with the plan and consent was signed. Patient is being admitted for inpatient treatment for surgery, pain control, PT, OT, prophylactic antibiotics, VTE prophylaxis, progressive ambulation and ADL's and discharge planning.The patient is planning to be discharged home with home health services

## 2016-03-25 NOTE — Transfer of Care (Signed)
Immediate Anesthesia Transfer of Care Note  Patient: Cody [name]  Procedure(s) Performed: Procedure(s): RIGHT TOTAL HIP ARTHROPLASTY ANTERIOR APPROACH (Right)  Patient Location: PACU  Anesthesia Type:Spinal  Level of Consciousness: awake, alert , oriented and patient cooperative  Airway & Oxygen Therapy: Patient Spontanous Breathing and Patient connected to face mask oxygen  Post-op Assessment: Report given to RN and Post -op Vital signs reviewed and stable  Post vital signs: stable  Last Vitals:  Filed Vitals:   03/25/16 0906  BP: 165/94  Pulse: 73  Temp: 36.7 C  Resp: 18    Last Pain: There were no vitals filed for this visit.    Patients Stated Pain Goal: 5 ([id number])  Complications: No apparent anesthesia complications  S1 spinal level

## 2016-03-25 NOTE — Anesthesia Procedure Notes (Signed)
Spinal Patient location during procedure: OR Start time: 03/25/2016 11:35 AM End time: 03/25/2016 11:42 AM Staffing Anesthesiologist: CREWS, DAVID Resident/CRNA: Lajuana Carry E Performed by: resident/CRNA  Preanesthetic Checklist Completed: patient identified, site marked, surgical consent, pre-op evaluation, timeout performed, IV checked, risks and benefits discussed and monitors and equipment checked Spinal Block Patient position: sitting Prep: Betadine Patient monitoring: heart rate, cardiac monitor, continuous pulse ox and blood pressure Location: L3-4 Injection technique: single-shot Needle Needle type: Spinocan and Sprotte  Needle gauge: 24 G Needle length: 10 cm

## 2016-03-25 NOTE — Interval H&P Note (Signed)
History and Physical Interval Note:  03/25/2016 10:56 AM  Cody [name]  has presented today for surgery, with the diagnosis of DJD RIGHT HIP  The various methods of treatment have been discussed with the patient and family. After consideration of risks, benefits and other options for treatment, the patient has consented to  Procedure(s): RIGHT TOTAL HIP ARTHROPLASTY ANTERIOR APPROACH (Right) as a surgical intervention .  The patient's history has been reviewed, patient examined, no change in status, stable for surgery.  I have reviewed the patient's chart and labs.  Questions were answered to the patient's satisfaction.     Benney [place], Horald Pollen

## 2016-03-25 NOTE — Op Note (Signed)
OPERATIVE REPORT  SURGEON: Rod Can, MD   ASSISTANT: Nehemiah Massed, PA-C.  PREOPERATIVE DIAGNOSIS: Right hip arthritis.   POSTOPERATIVE DIAGNOSIS: Right hip arthritis.   PROCEDURE: Right total hip arthroplasty, anterior approach.   IMPLANTS: DePuy Tri Lock stem, size 6, hi offset. DePuy Pinnacle Cup, size 64 mm. DePuy Altrx liner, size 36 by 64 mm, +4 neutral. DePuy Biolox ceramic head ball, size 36 + 5 mm.  ANESTHESIA:  Spinal  ESTIMATED BLOOD LOSS: 600 mL.   ANTIBIOTICS: 2 g ancef.  DRAINS: None.  COMPLICATIONS: None.   CONDITION: PACU - hemodynamically stable.Marland Kitchen   BRIEF CLINICAL NOTE: Cody [name] is a 70 y.o. male with a long-standing history of Right hip arthritis. After failing conservative management, the patient was indicated for total hip arthroplasty. The risks, benefits, and alternatives to the procedure were explained, and the patient elected to proceed.  PROCEDURE IN DETAIL: Surgical site was marked by myself. Spinal anesthesia was obtained in the pre-op holding area. Once inside the operative room, a foley catheter was inserted. The patient was then positioned on the Hana table. All bony prominences were well padded. The hip was prepped and draped in the normal sterile surgical fashion. A time-out was called verifying side and site of surgery. The patient received IV antibiotics within 60 minutes of beginning the procedure.  The direct anterior approach to the hip was performed through the Hueter interval. Lateral femoral circumflex vessels were treated with the Auqumantys. The anterior capsule was exposed and an inverted T capsulotomy was made.The femoral neck cut was made to the level of the templated cut. A corkscrew was placed into the head and the head was removed. The femoral head was found to have eburnated bone. The head was passed to the back table and was measured.  Acetabular exposure was achieved, and the pulvinar and labrum were  excised. Sequental reaming of the acetabulum was then performed up to a size 63 mm reamer. A 64 mm cup was then opened and impacted into place at approximately 40 degrees of abduction and 20 degrees of anteversion. The final polyethylene liner was impacted into place and acetabular osteophytes were removed.   I then gained femoral exposure taking care to protect the abductors and greater trochanter. This was performed using standard external rotation, extension, and adduction. The capsule was peeled off the inner aspect of the greater trochanter, taking care to preserve the short external rotators. A cookie cutter was used to enter the femoral canal, and then the femoral canal finder was placed. Sequential broaching was performed up to a size 6. Calcar planer was used on the femoral neck remnant. I placed a hi offset neck and a trial head ball. The hip was reduced. Leg lengths and offset were checked fluoroscopically. The hip was dislocated and trial components were removed. The final implants were placed, and the hip was reduced.  Fluoroscopy was used to confirm component position and leg lengths. At 90 degrees of external rotation and full extension, the hip was stable to an anterior directed force.  The wound was copiously irrigated with a dilute betadine solution followed by normal saline. Marcaine solution was injected into the periarticular soft tissue. The wound was closed in layers using #1 Vicryl and Stratafix for the fascia, 2-0 Vicryl for the subcutaneous fat, 2-0 Monocryl for the deep dermal layer, 3-0 running Monocryl subcuticular stitch, and Dermabond for the skin. Once the glue was fully dried, an Aquacell Ag dressing was applied. The patient was transported to  the recovery room in stable condition. Sponge, needle, and instrument counts were correct at the end of the case x2. The patient tolerated the procedure well and there were no known complications.  Please note that a  surgical assistant was a medical necessity for this procedure to perform it in a safe and expeditious manner. Assistant was necessary to provide appropriate retraction of vital neurovascular structures, to prevent femoral fracture, and to allow for anatomic placement of the prosthesis.

## 2016-03-25 NOTE — Discharge Instructions (Signed)
°Dr. Kaemon Barnett °Joint Replacement Specialist °[facility] °[address] °[place], [place] [address] °[phone] ° ° °TOTAL HIP REPLACEMENT POSTOPERATIVE DIRECTIONS ° ° ° °Hip Rehabilitation, Guidelines Following Surgery  ° °WEIGHT BEARING °Weight bearing as tolerated with assist device (walker, cane, etc) as directed, use it as long as suggested by your surgeon or therapist, typically at least 4-6 weeks. ° °The results of a hip operation are greatly improved after range of motion and muscle strengthening exercises. Follow all safety measures which are given to protect your hip. If any of these exercises cause increased pain or swelling in your joint, decrease the amount until you are comfortable again. Then slowly increase the exercises. Call your caregiver if you have problems or questions.  ° °HOME CARE INSTRUCTIONS  °Most of the following instructions are designed to prevent the dislocation of your new hip.  °Remove items at home which could result in a fall. This includes throw rugs or furniture in walking pathways.  °Continue medications as instructed at time of discharge. °· You may have some home medications which will be placed on hold until you complete the course of blood thinner medication. °· You may start showering once you are discharged home. Do not remove your dressing. °Do not put on socks or shoes without following the instructions of your caregivers.   °Sit on chairs with arms. Use the chair arms to help push yourself up when arising.  °Arrange for the use of a toilet seat elevator so you are not sitting low.  °· Walk with walker as instructed.  °You may resume a sexual relationship in one month or when given the OK by your caregiver.  °Use walker as long as suggested by your caregivers.  °You may put full weight on your legs and walk as much as is comfortable. °Avoid periods of inactivity such as sitting longer than an hour when not asleep. This helps prevent  blood clots.  °You may return to work once you are cleared by your surgeon.  °Do not drive a car for 6 weeks or until released by your surgeon.  °Do not drive while taking narcotics.  °Wear elastic stockings for two weeks following surgery during the day but you may remove then at night.  °Make sure you keep all of your appointments after your operation with all of your doctors and caregivers. You should call the office at the above phone number and make an appointment for approximately two weeks after the date of your surgery. °Please pick up a stool softener and laxative for home use as long as you are requiring pain medications. °· ICE to the affected hip every three hours for 30 minutes at a time and then as needed for pain and swelling. Continue to use ice on the hip for pain and swelling from surgery. You may notice swelling that will progress down to the foot and ankle.  This is normal after surgery.  Elevate the leg when you are not up walking on it.   °It is important for you to complete the blood thinner medication as prescribed by your doctor. °· Continue to use the breathing machine which will help keep your temperature down.  It is common for your temperature to cycle up and down following surgery, especially at night when you are not up moving around and exerting yourself.  The breathing machine keeps your lungs expanded and your temperature down. ° °RANGE OF MOTION AND STRENGTHENING EXERCISES  °These exercises are   designed to help you keep full movement of your hip joint. Follow your caregiver's or physical therapist's instructions. Perform all exercises about fifteen times, three times per day or as directed. Exercise both hips, even if you have had only one joint replacement. These exercises can be done on a training (exercise) mat, on the floor, on a table or on a bed. Use whatever works the best and is most comfortable for you. Use music or television while you are exercising so that the exercises  are a pleasant break in your day. This will make your life better with the exercises acting as a break in routine you can look forward to.  °Lying on your back, slowly slide your foot toward your buttocks, raising your knee up off the floor. Then slowly slide your foot back down until your leg is straight again.  °Lying on your back spread your legs as far apart as you can without causing discomfort.  °Lying on your side, raise your upper leg and foot straight up from the floor as far as is comfortable. Slowly lower the leg and repeat.  °Lying on your back, tighten up the muscle in the front of your thigh (quadriceps muscles). You can do this by keeping your leg straight and trying to raise your heel off the floor. This helps strengthen the largest muscle supporting your knee.  °Lying on your back, tighten up the muscles of your buttocks both with the legs straight and with the knee bent at a comfortable angle while keeping your heel on the floor.  ° °SKILLED REHAB INSTRUCTIONS: °If the patient is transferred to a skilled rehab facility following release from the hospital, a list of the current medications will be sent to the facility for the patient to continue.  When discharged from the skilled rehab facility, please have the facility set up the patient's Home Health Physical Therapy prior to being released. Also, the skilled facility will be responsible for providing the patient with their medications at time of release from the facility to include their pain medication and their blood thinner medication. If the patient is still at the rehab facility at time of the two week follow up appointment, the skilled rehab facility will also need to assist the patient in arranging follow up appointment in our office and any transportation needs. ° °MAKE SURE YOU:  °Understand these instructions.  °Will watch your condition.  °Will get help right away if you are not doing well or get worse. ° °Pick up stool softner and  laxative for home use following surgery while on pain medications. °Do not remove your dressing. °The dressing is waterproof--it is OK to take showers. °Continue to use ice for pain and swelling after surgery. °Do not use any lotions or creams on the incision until instructed by your surgeon. °Total Hip Protocol. ° ° °

## 2016-03-25 NOTE — Anesthesia Preprocedure Evaluation (Signed)
Anesthesia Evaluation  Patient identified by MRN, date of birth, ID band Patient awake    Reviewed: Allergy & Precautions, NPO status , Patient's Chart, lab work & pertinent test results  Airway Mallampati: I  TM Distance: >3 FB Neck ROM: Full    Dental  (+) Teeth Intact, Dental Advisory Given   Pulmonary former smoker,    breath sounds clear to auscultation       Cardiovascular  Rhythm:Regular Rate:Normal     Neuro/Psych    GI/Hepatic GERD  Medicated and Controlled,  Endo/Other    Renal/GU      Musculoskeletal   Abdominal   Peds  Hematology   Anesthesia Other Findings   Reproductive/Obstetrics                             Anesthesia Physical Anesthesia Plan  ASA: II  Anesthesia Plan: Spinal   Post-op Pain Management:    Induction: Intravenous  Airway Management Planned: Simple Face Mask  Additional Equipment:   Intra-op Plan:   Post-operative Plan:   Informed Consent: I have reviewed the patients History and Physical, chart, labs and discussed the procedure including the risks, benefits and alternatives for the proposed anesthesia with the patient or authorized representative who has indicated his/her understanding and acceptance.   Dental advisory given  Plan Discussed with: CRNA, Anesthesiologist and Surgeon  Anesthesia Plan Comments:         Anesthesia Quick Evaluation

## 2016-03-26 LAB — CBC
HCT: 34.5 % — ABNORMAL LOW (ref 39.0–52.0)
Hemoglobin: 12 g/dL — ABNORMAL LOW (ref 13.0–17.0)
MCH: 29.6 pg (ref 26.0–34.0)
MCHC: 34.8 g/dL (ref 30.0–36.0)
MCV: 85 fL (ref 78.0–100.0)
PLATELETS: 194 10*3/uL (ref 150–400)
RBC: 4.06 MIL/uL — AB (ref 4.22–5.81)
RDW: 13.3 % (ref 11.5–15.5)
WBC: 15.3 10*3/uL — AB (ref 4.0–10.5)

## 2016-03-26 LAB — BASIC METABOLIC PANEL
ANION GAP: 5 (ref 5–15)
BUN: 18 mg/dL (ref 6–20)
CALCIUM: 8.8 mg/dL — AB (ref 8.9–10.3)
CO2: 25 mmol/L (ref 22–32)
Chloride: 109 mmol/L (ref 101–111)
Creatinine, Ser: 1.19 mg/dL (ref 0.61–1.24)
GFR calc Af Amer: >60 mL/min (ref >60)
GLUCOSE: 144 mg/dL — AB (ref 65–99)
POTASSIUM: 4.3 mmol/L (ref 3.5–5.1)
SODIUM: 139 mmol/L (ref 135–145)

## 2016-03-26 MED ORDER — HYDROCODONE-ACETAMINOPHEN 5-325 MG PO TABS: 1.0000 | ORAL_TABLET | ORAL | Status: DC | PRN

## 2016-03-26 MED ORDER — SENNA 8.6 MG PO TABS: 2.0000 | ORAL_TABLET | Freq: Every day | ORAL | Status: DC

## 2016-03-26 MED ORDER — ONDANSETRON HCL 4 MG PO TABS: 4.0000 mg | ORAL_TABLET | Freq: Four times a day (QID) | ORAL | Status: DC | PRN

## 2016-03-26 MED ORDER — MELOXICAM 15 MG PO TABS: 15.0000 mg | ORAL_TABLET | Freq: Every day | ORAL | Status: DC

## 2016-03-26 MED ORDER — OMEPRAZOLE MAGNESIUM 20 MG PO TBEC: 20.0000 mg | DELAYED_RELEASE_TABLET | Freq: Two times a day (BID) | ORAL | Status: DC

## 2016-03-26 MED ORDER — DOCUSATE SODIUM 100 MG PO CAPS: 100.0000 mg | ORAL_CAPSULE | Freq: Two times a day (BID) | ORAL | Status: DC

## 2016-03-26 MED ORDER — ASPIRIN EC 325 MG PO TBEC: 325.0000 mg | DELAYED_RELEASE_TABLET | Freq: Two times a day (BID) | ORAL | Status: DC

## 2016-03-26 NOTE — Progress Notes (Signed)
Utilization review completed.  

## 2016-03-26 NOTE — Progress Notes (Signed)
Physical Therapy Treatment Patient Details Name: Cody [name] MRN: [id number] DOB: 1946-05-27 Today's Date: 03/26/2016    History of Present Illness Pt s/p R THR with hx of L THR 11/16    PT Comments    Pt progressing well with mobility.  Spouse present and reviewed home therex, car transfers and stairs.  Follow Up Recommendations  No PT follow up     Equipment Recommendations  None recommended by PT    Recommendations for Other Services OT consult     Precautions / Restrictions Precautions Precautions: Fall Restrictions Weight Bearing Restrictions: No Other Position/Activity Restrictions: WBAT    Mobility  Bed Mobility Overal bed mobility: Needs Assistance Bed Mobility: Supine to Sit;Sit to Supine     Supine to sit: Min guard Sit to supine: Min guard   General bed mobility comments: cues for sequence and use of L LE to self assist  Transfers Overall transfer level: Needs assistance Equipment used: Rolling walker (2 wheeled) Transfers: Sit to/from Stand Sit to Stand: Min guard;Supervision         General transfer comment: cues for LE management and use of UEs to self assist  Ambulation/Gait Ambulation/Gait assistance: Min guard;Supervision Ambulation Distance (Feet): 220 Feet Assistive device: Rolling walker (2 wheeled) Gait Pattern/deviations: Step-through pattern;Decreased step length - right;Decreased step length - left;Shuffle;Trunk flexed Gait velocity: decr   General Gait Details: cues for posture, position from RW and initial sequence   Stairs Stairs: Yes Stairs assistance: Min assist Stair Management: No rails;Step to pattern;Forwards;With walker Number of Stairs: 6 General stair comments: 3 stairs twice with RW and cues for sequence  Wheelchair Mobility    Modified Rankin (Stroke Patients Only)       Balance                                    Cognition Arousal/Alertness: Awake/alert Behavior During Therapy: WFL  for tasks assessed/performed Overall Cognitive Status: Within Functional Limits for tasks assessed                      Exercises Total Joint Exercises Ankle Circles/Pumps: AROM;Both;15 reps;Supine Quad Sets: AROM;Both;10 reps;Supine Gluteal Sets: AROM;Both;10 reps;Supine Heel Slides: AAROM;Right;20 reps;Supine Hip ABduction/ADduction: AAROM;Right;15 reps;Supine    General Comments        Pertinent Vitals/Pain Pain Assessment: 0-10 Pain Score: 3  Pain Location: R hip Pain Descriptors / Indicators: Aching;Sore Pain Intervention(s): Limited activity within patient's tolerance;Monitored during session;Premedicated before session;Ice applied    Home Living                      Prior Function            PT Goals (current goals can now be found in the care plan section) Acute Rehab PT Goals Patient Stated Goal: REgain IND PT Goal Formulation: With patient Time For Goal Achievement: 03/27/16 Potential to Achieve Goals: Good Progress towards PT goals: Progressing toward goals    Frequency  7X/week    PT Plan Current plan remains appropriate    Co-evaluation             End of Session Equipment Utilized During Treatment: Gait belt Activity Tolerance: Patient tolerated treatment well Patient left: in chair;with call bell/phone within reach;with family/visitor present     Time: 1332-1415 PT Time Calculation (min) (ACUTE ONLY): 43 min  Charges:  $Gait Training: 8-22 mins $Therapeutic Exercise:  8-22 mins $Therapeutic Activity: 8-22 mins                    G Codes:      Laysa Kimmey Apr 05, 2016, 2:29 PM

## 2016-03-26 NOTE — Care Management Note (Signed)
Case Management Note  Patient Details  Name: PIETER BACHAND MRN: [id number] Date of Birth: 10/19/1946  Subjective/Objective:         70 yo admitted for Right total hip arthroplasty         Action/Plan: Pt from home with spouse.  Pt states he has had the other hip done previously and has all equipment needed at home. PT/OT did not recommend home health PT/OT. Pt declines any HH needs.  No other CM needs communicated.  Expected Discharge Date:                  Expected Discharge Plan:  Home/Self Care  In-House Referral:     Discharge planning Services  CM Consult  Post Acute Care Choice:    Choice offered to:     DME Arranged:    DME Agency:     HH Arranged:    Arthur Agency:     Status of Service:  Completed, signed off  Medicare Important Message Given:    Date Medicare IM Given:    Medicare IM give by:    Date Additional Medicare IM Given:    Additional Medicare Important Message give by:     If discussed at [place] of Stay Meetings, dates discussed:    Additional CommentsLynnell Catalan, RN 03/26/2016, 11:03 AM  [phone]

## 2016-03-26 NOTE — Progress Notes (Signed)
   Subjective:  Patient reports pain as mild to moderate.  No c/o. Denies N/V/CP/SOB.  Objective:   VITALS:   Filed Vitals:   03/25/16 2012 03/25/16 2215 03/26/16 0107 03/26/16 0615  BP:  125/64 117/63 121/86  Pulse: 71 79 78 73  Temp:  98.2 F (36.8 C) 98.5 F (36.9 C) 97.9 F (36.6 C)  TempSrc:  Oral Oral Oral  Resp: 16 16 16 16   Height:      Weight:      SpO2: 99% 100% 98% 99%    ABD soft Sensation intact distally Intact pulses distally Dorsiflexion/Plantar flexion intact Incision: dressing C/D/I Compartment soft   Lab Results  Component Value Date   WBC 15.3* 03/26/2016   HGB 12.0* 03/26/2016   HCT 34.5* 03/26/2016   MCV 85.0 03/26/2016   PLT 194 03/26/2016   BMET    Component Value Date/Time   NA 139 03/26/2016 0507   K 4.3 03/26/2016 0507   CL 109 03/26/2016 0507   CO2 25 03/26/2016 0507   GLUCOSE 144* 03/26/2016 0507   BUN 18 03/26/2016 0507   CREATININE 1.19 03/26/2016 0507   CALCIUM 8.8* 03/26/2016 0507   GFRNONAA >60 03/26/2016 0507   GFRAA >60 03/26/2016 0507     Assessment/Plan: 1 Day Post-Op   Principal Problem:   Primary osteoarthritis of right hip   WBAT with walker DVT ppx: ASA, SCDS, TEDS PO pain control PT/OT D/c home after clears therapy    Alante Tolan, Horald Pollen 03/26/2016, 7:17 AM   Rod Can, MD Cell [phone]

## 2016-03-26 NOTE — Progress Notes (Signed)
OT Cancellation Note  Patient Details Name: Cody [name] MRN: [id number] DOB: [birth year]   Cancelled Treatment:    Reason Eval/Treat Not Completed: OT screened, no needs identified, will sign off. Spoke to patient. He had L THA in November 2016. He reports he is familiar with all OT/ADL techniques from previous surgery. He has all necessary equipment. Denies need for OT at this time. Will sign off.  Kelcee Bjorn A 03/26/2016, 9:19 AM

## 2016-03-26 NOTE — Progress Notes (Signed)
Physical Therapy Evaluation Patient Details Name: Cody [name] MRN: [id number] DOB: Aug 13, 1946 Today's Date: 03/26/2016   History of Present Illness  Pt s/p R THR with hx of L THR 11/16  Clinical Impression  Pt s/p R THR presents with decreased R LE strength/ROM and post op pain limiting functional mobility.  Pt should progress well to dc home with family assist.    Follow Up Recommendations No PT follow up    Equipment Recommendations  None recommended by PT    Recommendations for Other Services OT consult     Precautions / Restrictions Precautions Precautions: Fall Restrictions Weight Bearing Restrictions: No Other Position/Activity Restrictions: WBAT      Mobility  Bed Mobility Overal bed mobility: Needs Assistance Bed Mobility: Supine to Sit     Supine to sit: Min assist     General bed mobility comments: cues for sequence and use of L LE to self assist  Transfers Overall transfer level: Needs assistance Equipment used: Rolling walker (2 wheeled) Transfers: Sit to/from Stand Sit to Stand: Min assist         General transfer comment: cues for LE management and use of UEs to self assist  Ambulation/Gait Ambulation/Gait assistance: Min assist;Min guard Ambulation Distance (Feet): 180 Feet Assistive device: Rolling walker (2 wheeled) Gait Pattern/deviations: Step-to pattern;Step-through pattern;Shuffle;Trunk flexed Gait velocity: decr   General Gait Details: cues for posture, position from RW and initial sequence  Stairs            Wheelchair Mobility    Modified Rankin (Stroke Patients Only)       Balance                                             Pertinent Vitals/Pain Pain Assessment: 0-10 Pain Score: 3  Pain Location: R hip Pain Descriptors / Indicators: Aching;Sore Pain Intervention(s): Limited activity within patient's tolerance;Monitored during session;Premedicated before session;Ice applied    Home  Living Family/patient expects to be discharged to:: Private residence Living Arrangements: Spouse/significant other Available Help at Discharge: Family Type of Home: House Home Access: Stairs to enter Entrance Stairs-Rails: None Entrance Stairs-Number of Steps: 3 Home Layout: Able to live on main level with bedroom/bathroom        Prior Function Level of Independence: Independent               Hand Dominance        Extremity/Trunk Assessment   Upper Extremity Assessment: Overall WFL for tasks assessed           Lower Extremity Assessment: RLE deficits/detail RLE Deficits / Details: Strength at hip 2+/5 with AAROM at hip to 80 flex and 15 abd    Cervical / Trunk Assessment: Normal  Communication   Communication: No difficulties  Cognition Arousal/Alertness: Awake/alert Behavior During Therapy: WFL for tasks assessed/performed Overall Cognitive Status: Within Functional Limits for tasks assessed                      General Comments      Exercises Total Joint Exercises Ankle Circles/Pumps: AROM;Both;15 reps;Supine Quad Sets: AROM;Both;10 reps;Supine Heel Slides: AAROM;Right;20 reps;Supine Hip ABduction/ADduction: AAROM;Right;15 reps;Supine      Assessment/Plan    PT Assessment Patient needs continued PT services  PT Diagnosis Difficulty walking   PT Problem List Decreased strength;Decreased range of motion;Decreased activity tolerance;Decreased mobility;Decreased knowledge of use  of DME;Pain  PT Treatment Interventions DME instruction;Gait training;Functional mobility training;Stair training;Therapeutic activities;Therapeutic exercise;Patient/family education   PT Goals (Current goals can be found in the Care Plan section) Acute Rehab PT Goals Patient Stated Goal: REgain IND PT Goal Formulation: With patient Time For Goal Achievement: 03/27/16 Potential to Achieve Goals: Good    Frequency 7X/week   Barriers to discharge         Co-evaluation               End of Session Equipment Utilized During Treatment: Gait belt Activity Tolerance: Patient tolerated treatment well Patient left: in chair;with call bell/phone within reach;with family/visitor present Nurse Communication: Mobility status         Time: 1000-1027 PT Time Calculation (min) (ACUTE ONLY): 27 min   Charges:   PT Evaluation $PT Eval Low Complexity: 1 Procedure PT Treatments $Therapeutic Exercise: 8-22 mins   PT G Codes:        Carrye Goller 27-Mar-2016, 12:28 PM

## 2016-04-07 DIAGNOSIS — Z4789 Encounter for other orthopedic aftercare: Secondary | ICD-10-CM | POA: Diagnosis not present

## 2016-05-09 DIAGNOSIS — H6123 Impacted cerumen, bilateral: Secondary | ICD-10-CM | POA: Diagnosis not present

## 2016-05-10 DIAGNOSIS — Z471 Aftercare following joint replacement surgery: Secondary | ICD-10-CM | POA: Diagnosis not present

## 2016-05-10 DIAGNOSIS — Z96641 Presence of right artificial hip joint: Secondary | ICD-10-CM | POA: Diagnosis not present

## 2016-06-08 DIAGNOSIS — I700 Atherosclerosis of aorta: Secondary | ICD-10-CM | POA: Diagnosis not present

## 2016-06-08 DIAGNOSIS — M1990 Unspecified osteoarthritis, unspecified site: Secondary | ICD-10-CM | POA: Diagnosis not present

## 2016-06-08 DIAGNOSIS — F419 Anxiety disorder, unspecified: Secondary | ICD-10-CM | POA: Diagnosis not present

## 2016-06-08 DIAGNOSIS — Z0000 Encounter for general adult medical examination without abnormal findings: Secondary | ICD-10-CM | POA: Diagnosis not present

## 2016-06-08 DIAGNOSIS — H9193 Unspecified hearing loss, bilateral: Secondary | ICD-10-CM | POA: Diagnosis not present

## 2016-06-08 DIAGNOSIS — Z1159 Encounter for screening for other viral diseases: Secondary | ICD-10-CM | POA: Diagnosis not present

## 2016-06-08 DIAGNOSIS — N183 Chronic kidney disease, stage 3 (moderate): Secondary | ICD-10-CM | POA: Diagnosis not present

## 2016-06-08 DIAGNOSIS — R7309 Other abnormal glucose: Secondary | ICD-10-CM | POA: Diagnosis not present

## 2016-06-08 DIAGNOSIS — Z125 Encounter for screening for malignant neoplasm of prostate: Secondary | ICD-10-CM | POA: Diagnosis not present

## 2016-06-08 DIAGNOSIS — Z1389 Encounter for screening for other disorder: Secondary | ICD-10-CM | POA: Diagnosis not present

## 2016-06-08 DIAGNOSIS — J309 Allergic rhinitis, unspecified: Secondary | ICD-10-CM | POA: Diagnosis not present

## 2016-06-08 DIAGNOSIS — G4733 Obstructive sleep apnea (adult) (pediatric): Secondary | ICD-10-CM | POA: Diagnosis not present

## 2016-06-08 DIAGNOSIS — E7800 Pure hypercholesterolemia, unspecified: Secondary | ICD-10-CM | POA: Diagnosis not present

## 2016-06-08 DIAGNOSIS — K219 Gastro-esophageal reflux disease without esophagitis: Secondary | ICD-10-CM | POA: Diagnosis not present

## 2016-09-02 DIAGNOSIS — H6980 Other specified disorders of Eustachian tube, unspecified ear: Secondary | ICD-10-CM | POA: Diagnosis not present

## 2016-09-02 DIAGNOSIS — H6123 Impacted cerumen, bilateral: Secondary | ICD-10-CM | POA: Diagnosis not present

## 2016-09-02 DIAGNOSIS — M65322 Trigger finger, left index finger: Secondary | ICD-10-CM | POA: Diagnosis not present

## 2016-12-31 DIAGNOSIS — M79672 Pain in left foot: Secondary | ICD-10-CM | POA: Diagnosis not present

## 2016-12-31 DIAGNOSIS — M779 Enthesopathy, unspecified: Secondary | ICD-10-CM | POA: Diagnosis not present

## 2017-01-13 DIAGNOSIS — M2022 Hallux rigidus, left foot: Secondary | ICD-10-CM | POA: Diagnosis not present

## 2017-01-31 DIAGNOSIS — M2022 Hallux rigidus, left foot: Secondary | ICD-10-CM | POA: Diagnosis not present

## 2017-02-02 ENCOUNTER — Other Ambulatory Visit: Payer: Self-pay | Admitting: Orthopedic Surgery

## 2017-02-18 ENCOUNTER — Encounter (HOSPITAL_BASED_OUTPATIENT_CLINIC_OR_DEPARTMENT_OTHER): Payer: Self-pay | Admitting: *Deleted

## 2017-02-24 ENCOUNTER — Encounter (HOSPITAL_BASED_OUTPATIENT_CLINIC_OR_DEPARTMENT_OTHER): Payer: Self-pay | Admitting: *Deleted

## 2017-03-03 ENCOUNTER — Ambulatory Visit (HOSPITAL_BASED_OUTPATIENT_CLINIC_OR_DEPARTMENT_OTHER): Payer: PPO | Admitting: Certified Registered"

## 2017-03-03 ENCOUNTER — Ambulatory Visit (HOSPITAL_BASED_OUTPATIENT_CLINIC_OR_DEPARTMENT_OTHER)
Admission: RE | Admit: 2017-03-03 | Discharge: 2017-03-03 | Disposition: A | Discharge status: Home / Self Care | Payer: PPO | Source: Ambulatory Visit | Attending: Orthopedic Surgery | Admitting: Orthopedic Surgery

## 2017-03-03 ENCOUNTER — Encounter (HOSPITAL_BASED_OUTPATIENT_CLINIC_OR_DEPARTMENT_OTHER)
Admission: RE | Disposition: A | Discharge status: Home / Self Care | Payer: Self-pay | Source: Ambulatory Visit | Attending: Orthopedic Surgery

## 2017-03-03 ENCOUNTER — Encounter (HOSPITAL_BASED_OUTPATIENT_CLINIC_OR_DEPARTMENT_OTHER): Payer: Self-pay

## 2017-03-03 DIAGNOSIS — Z96643 Presence of artificial hip joint, bilateral: Secondary | ICD-10-CM | POA: Diagnosis not present

## 2017-03-03 DIAGNOSIS — K219 Gastro-esophageal reflux disease without esophagitis: Secondary | ICD-10-CM | POA: Insufficient documentation

## 2017-03-03 DIAGNOSIS — Z7982 Long term (current) use of aspirin: Secondary | ICD-10-CM | POA: Diagnosis not present

## 2017-03-03 DIAGNOSIS — Z885 Allergy status to narcotic agent status: Secondary | ICD-10-CM | POA: Diagnosis not present

## 2017-03-03 DIAGNOSIS — M2022 Hallux rigidus, left foot: Secondary | ICD-10-CM | POA: Diagnosis not present

## 2017-03-03 DIAGNOSIS — M79672 Pain in left foot: Secondary | ICD-10-CM | POA: Diagnosis not present

## 2017-03-03 DIAGNOSIS — E785 Hyperlipidemia, unspecified: Secondary | ICD-10-CM | POA: Diagnosis not present

## 2017-03-03 DIAGNOSIS — R51 Headache: Secondary | ICD-10-CM | POA: Diagnosis not present

## 2017-03-03 DIAGNOSIS — Z87891 Personal history of nicotine dependence: Secondary | ICD-10-CM | POA: Insufficient documentation

## 2017-03-03 DIAGNOSIS — M1990 Unspecified osteoarthritis, unspecified site: Secondary | ICD-10-CM | POA: Insufficient documentation

## 2017-03-03 DIAGNOSIS — Z8249 Family history of ischemic heart disease and other diseases of the circulatory system: Secondary | ICD-10-CM | POA: Diagnosis not present

## 2017-03-03 DIAGNOSIS — M160 Bilateral primary osteoarthritis of hip: Secondary | ICD-10-CM | POA: Diagnosis not present

## 2017-03-03 DIAGNOSIS — G4733 Obstructive sleep apnea (adult) (pediatric): Secondary | ICD-10-CM | POA: Diagnosis not present

## 2017-03-03 DIAGNOSIS — Z823 Family history of stroke: Secondary | ICD-10-CM | POA: Insufficient documentation

## 2017-03-03 DIAGNOSIS — Z880 Allergy status to penicillin: Secondary | ICD-10-CM | POA: Insufficient documentation

## 2017-03-03 DIAGNOSIS — R351 Nocturia: Secondary | ICD-10-CM | POA: Insufficient documentation

## 2017-03-03 DIAGNOSIS — Z79899 Other long term (current) drug therapy: Secondary | ICD-10-CM | POA: Insufficient documentation

## 2017-03-03 DIAGNOSIS — Z8619 Personal history of other infectious and parasitic diseases: Secondary | ICD-10-CM | POA: Insufficient documentation

## 2017-03-03 DIAGNOSIS — Z85828 Personal history of other malignant neoplasm of skin: Secondary | ICD-10-CM | POA: Insufficient documentation

## 2017-03-03 DIAGNOSIS — M2021 Hallux rigidus, right foot: Secondary | ICD-10-CM

## 2017-03-03 SURGERY — CHEILECTOMY, GREAT TOE, WITH IMPLANT INSERTION
Anesthesia: Monitor Anesthesia Care | Site: Foot | Laterality: Left

## 2017-03-03 MED ORDER — OXYCODONE HCL 5 MG PO TABS: 5.0000 mg | ORAL_TABLET | ORAL | 0 refills | Status: DC | PRN

## 2017-03-03 MED ORDER — 0.9 % SODIUM CHLORIDE (POUR BTL) OPTIME
TOPICAL | Status: DC | PRN
  Administered 2017-03-03: 200 mL

## 2017-03-03 MED ORDER — DEXTROSE 5 % IV SOLN
3.0000 g | INTRAVENOUS | Status: AC
  Administered 2017-03-03: 3 g via INTRAVENOUS

## 2017-03-03 MED ORDER — LIDOCAINE HCL (CARDIAC) 20 MG/ML IV SOLN
INTRAVENOUS | Status: DC | PRN
  Administered 2017-03-03: 80 mg via INTRAVENOUS

## 2017-03-03 MED ORDER — PROPOFOL 500 MG/50ML IV EMUL
INTRAVENOUS | Status: AC
  Filled 2017-03-03: qty 100

## 2017-03-03 MED ORDER — PROPOFOL 10 MG/ML IV BOLUS
INTRAVENOUS | Status: DC | PRN
  Administered 2017-03-03: 100 mg via INTRAVENOUS

## 2017-03-03 MED ORDER — SODIUM CHLORIDE 0.9 % IV SOLN: INTRAVENOUS | Status: DC

## 2017-03-03 MED ORDER — FENTANYL CITRATE (PF) 100 MCG/2ML IJ SOLN
50.0000 ug | INTRAMUSCULAR | Status: DC | PRN
  Administered 2017-03-03 (×2): 50 ug via INTRAVENOUS

## 2017-03-03 MED ORDER — BUPIVACAINE-EPINEPHRINE (PF) 0.5% -1:200000 IJ SOLN
INTRAMUSCULAR | Status: DC | PRN
  Administered 2017-03-03: 30 mL via PERINEURAL

## 2017-03-03 MED ORDER — CEFAZOLIN SODIUM-DEXTROSE 2-4 GM/100ML-% IV SOLN
INTRAVENOUS | Status: AC
Start: 2017-03-03 — End: 2017-03-03
  Filled 2017-03-03: qty 200

## 2017-03-03 MED ORDER — CEFAZOLIN SODIUM-DEXTROSE 2-4 GM/100ML-% IV SOLN: 2.0000 g | INTRAVENOUS | Status: DC

## 2017-03-03 MED ORDER — DEXAMETHASONE SODIUM PHOSPHATE 10 MG/ML IJ SOLN
INTRAMUSCULAR | Status: DC | PRN
  Administered 2017-03-03: 10 mg via INTRAVENOUS

## 2017-03-03 MED ORDER — SCOPOLAMINE 1 MG/3DAYS TD PT72: 1.0000 | MEDICATED_PATCH | Freq: Once | TRANSDERMAL | Status: DC | PRN

## 2017-03-03 MED ORDER — FENTANYL CITRATE (PF) 100 MCG/2ML IJ SOLN
INTRAMUSCULAR | Status: AC
  Filled 2017-03-03: qty 2

## 2017-03-03 MED ORDER — CHLORHEXIDINE GLUCONATE 4 % EX LIQD: 60.0000 mL | Freq: Once | CUTANEOUS | Status: DC

## 2017-03-03 MED ORDER — LACTATED RINGERS IV SOLN
INTRAVENOUS | Status: DC
  Administered 2017-03-03: 13:00:00 via INTRAVENOUS

## 2017-03-03 MED ORDER — PROPOFOL 500 MG/50ML IV EMUL
INTRAVENOUS | Status: DC | PRN
  Administered 2017-03-03: 200 ug/kg/min via INTRAVENOUS

## 2017-03-03 MED ORDER — MIDAZOLAM HCL 2 MG/2ML IJ SOLN
INTRAMUSCULAR | Status: AC
  Filled 2017-03-03: qty 2

## 2017-03-03 MED ORDER — MIDAZOLAM HCL 2 MG/2ML IJ SOLN
1.0000 mg | INTRAMUSCULAR | Status: DC | PRN
  Administered 2017-03-03: 2 mg via INTRAVENOUS

## 2017-03-03 SURGICAL SUPPLY — 66 items
BANDAGE ESMARK 6X9 LF (GAUZE/BANDAGES/DRESSINGS) IMPLANT
BLADE AVERAGE 25MMX9MM (BLADE)
BLADE AVERAGE 25X9 (BLADE) IMPLANT
BLADE MINI RND TIP GREEN BEAV (BLADE) IMPLANT
BLADE OSC/SAG .038X5.5 CUT EDG (BLADE) IMPLANT
BLADE SURG 15 STRL LF DISP TIS (BLADE) ×3 IMPLANT
BLADE SURG 15 STRL SS (BLADE) ×4
BNDG CMPR 9X4 STRL LF SNTH (GAUZE/BANDAGES/DRESSINGS)
BNDG CMPR 9X6 STRL LF SNTH (GAUZE/BANDAGES/DRESSINGS)
BNDG COHESIVE 4X5 TAN STRL (GAUZE/BANDAGES/DRESSINGS) ×4 IMPLANT
BNDG CONFORM 2 STRL LF (GAUZE/BANDAGES/DRESSINGS) IMPLANT
BNDG CONFORM 3 STRL LF (GAUZE/BANDAGES/DRESSINGS) ×4 IMPLANT
BNDG ESMARK 4X9 LF (GAUZE/BANDAGES/DRESSINGS) IMPLANT
BNDG ESMARK 6X9 LF (GAUZE/BANDAGES/DRESSINGS)
CHLORAPREP W/TINT 26ML (MISCELLANEOUS) ×4 IMPLANT
COVER BACK TABLE 60X90IN (DRAPES) ×4 IMPLANT
CUFF TOURNIQUET SINGLE 24IN (TOURNIQUET CUFF) ×4 IMPLANT
CUFF TOURNIQUET SINGLE 34IN LL (TOURNIQUET CUFF) IMPLANT
CUFF TOURNIQUET SINGLE 44IN (TOURNIQUET CUFF) IMPLANT
DRAPE EXTREMITY T 121X128X90 (DRAPE) ×4 IMPLANT
DRAPE OEC MINIVIEW 54X84 (DRAPES) IMPLANT
DRAPE SURG 17X23 STRL (DRAPES) IMPLANT
DRAPE U-SHAPE 47X51 STRL (DRAPES) ×3 IMPLANT
DRSG MEPITEL 4X7.2 (GAUZE/BANDAGES/DRESSINGS) ×4 IMPLANT
DRSG PAD ABDOMINAL 8X10 ST (GAUZE/BANDAGES/DRESSINGS) ×4 IMPLANT
ELECT REM PT RETURN 9FT ADLT (ELECTROSURGICAL) ×4
ELECTRODE REM PT RTRN 9FT ADLT (ELECTROSURGICAL) ×2 IMPLANT
GAUZE SPONGE 4X4 12PLY STRL (GAUZE/BANDAGES/DRESSINGS) ×4 IMPLANT
GLOVE BIO SURGEON STRL SZ8 (GLOVE) ×4 IMPLANT
GLOVE BIOGEL PI IND STRL 7.0 (GLOVE) ×2 IMPLANT
GLOVE BIOGEL PI IND STRL 8 (GLOVE) ×4 IMPLANT
GLOVE BIOGEL PI INDICATOR 7.0 (GLOVE) ×4
GLOVE BIOGEL PI INDICATOR 8 (GLOVE) ×4
GLOVE ECLIPSE 6.5 STRL STRAW (GLOVE) ×3 IMPLANT
GLOVE ECLIPSE 8.0 STRL XLNG CF (GLOVE) ×4 IMPLANT
GOWN STRL REUS W/ TWL LRG LVL3 (GOWN DISPOSABLE) ×2 IMPLANT
GOWN STRL REUS W/ TWL XL LVL3 (GOWN DISPOSABLE) ×4 IMPLANT
GOWN STRL REUS W/TWL LRG LVL3 (GOWN DISPOSABLE) ×4
GOWN STRL REUS W/TWL XL LVL3 (GOWN DISPOSABLE) ×8
IMPL MTP CARTIVA 10MM (Orthopedic Implant) ×1 IMPLANT
IMPLANT MTP CARTIVA 10MM (Orthopedic Implant) ×4 IMPLANT
NDL HYPO 25X1 1.5 SAFETY (NEEDLE) IMPLANT
NEEDLE HYPO 25X1 1.5 SAFETY (NEEDLE) ×4 IMPLANT
NS IRRIG 1000ML POUR BTL (IV SOLUTION) ×4 IMPLANT
PACK BASIN DAY SURGERY FS (CUSTOM PROCEDURE TRAY) ×4 IMPLANT
PAD CAST 4YDX4 CTTN HI CHSV (CAST SUPPLIES) ×2 IMPLANT
PADDING CAST COTTON 4X4 STRL (CAST SUPPLIES) ×4
PENCIL BUTTON HOLSTER BLD 10FT (ELECTRODE) ×4 IMPLANT
SANITIZER HAND PURELL 535ML FO (MISCELLANEOUS) ×4 IMPLANT
SHEET MEDIUM DRAPE 40X70 STRL (DRAPES) ×4 IMPLANT
SLEEVE SCD COMPRESS KNEE MED (MISCELLANEOUS) ×4 IMPLANT
SPONGE LAP 18X18 X RAY DECT (DISPOSABLE) ×4 IMPLANT
STOCKINETTE 6  STRL (DRAPES) ×2
STOCKINETTE 6 STRL (DRAPES) ×2 IMPLANT
SUCTION FRAZIER HANDLE 10FR (MISCELLANEOUS) ×2
SUCTION TUBE FRAZIER 10FR DISP (MISCELLANEOUS) ×2 IMPLANT
SUT ETHILON 3 0 PS 1 (SUTURE) ×4 IMPLANT
SUT MNCRL AB 3-0 PS2 18 (SUTURE) ×4 IMPLANT
SUT VIC AB 2-0 SH 27 (SUTURE) ×4
SUT VIC AB 2-0 SH 27XBRD (SUTURE) ×1 IMPLANT
SYR BULB 3OZ (MISCELLANEOUS) ×4 IMPLANT
SYR CONTROL 10ML LL (SYRINGE) ×3 IMPLANT
TOWEL OR 17X24 6PK STRL BLUE (TOWEL DISPOSABLE) ×4 IMPLANT
TUBE CONNECTING 20'X1/4 (TUBING) ×1
TUBE CONNECTING 20X1/4 (TUBING) ×3 IMPLANT
UNDERPAD 30X30 (UNDERPADS AND DIAPERS) ×4 IMPLANT

## 2017-03-03 NOTE — Anesthesia Procedure Notes (Signed)
Procedure Name: LMA Insertion Performed by: Lener Ventresca W Pre-anesthesia Checklist: Patient identified, Emergency Drugs available, Suction available and Patient being monitored Patient Re-evaluated:Patient Re-evaluated prior to inductionOxygen Delivery Method: Circle system utilized Preoxygenation: Pre-oxygenation with 100% oxygen Intubation Type: IV induction Ventilation: Mask ventilation without difficulty LMA: LMA inserted LMA Size: 4.0 Number of attempts: 1 Placement Confirmation: positive ETCO2 Tube secured with: Tape Dental Injury: Teeth and Oropharynx as per pre-operative assessment        

## 2017-03-03 NOTE — Brief Op Note (Signed)
03/03/2017  2:57 PM  PATIENT:  Cody [name]  71 y.o. male  PRE-OPERATIVE DIAGNOSIS:  Left hallux rigidus   POST-OPERATIVE DIAGNOSIS:  Left hallux rigidus   Procedure(s): Left Hallux Metatarsal Phalangeal Joint Cheilectomy and Joint Resurfacing (10 mm Cartiva)  SURGEON:  Wylene Simmer, MD  ASSISTANT: n/a  ANESTHESIA:   General, regional  EBL:  minimal   TOURNIQUET:   Total Tourniquet Time Documented: Calf (Left) - 27 minutes Total: Calf (Left) - 27 minutes  COMPLICATIONS:  None apparent  DISPOSITION:  Extubated, awake and stable to recovery.  DICTATION ID:  [id number]

## 2017-03-03 NOTE — Transfer of Care (Signed)
Immediate Anesthesia Transfer of Care Note  Patient: Cody [name]  Procedure(s) Performed: Procedure(s): Left Hallux Metatarsal Phalangeal Joint Cheilectomy and Joint Resurfacing (Left)  Patient Location: PACU  Anesthesia Type:General  Level of Consciousness: awake and sedated  Airway & Oxygen Therapy: Patient Spontanous Breathing and Patient connected to face mask oxygen  Post-op Assessment: Report given to RN and Post -op Vital signs reviewed and stable  Post vital signs: Reviewed and stable  Last Vitals:  Vitals:   03/03/17 1302  BP: 136/82  Pulse: 78  Resp: 17  Temp: 37 C    Last Pain:  Vitals:   03/03/17 1302  TempSrc: Oral  PainSc: 2       Patients Stated Pain Goal: 2 (01/65/53 7482)  Complications: No apparent anesthesia complications

## 2017-03-03 NOTE — Progress Notes (Signed)
Assisted Dr. Rob Fitzgerald with left, ultrasound guided, popliteal block. Side rails up, monitors on throughout procedure. See vital signs in flow sheet. Tolerated Procedure well. 

## 2017-03-03 NOTE — Anesthesia Preprocedure Evaluation (Addendum)
Anesthesia Evaluation  Patient identified by MRN, date of birth, ID band Patient awake    Reviewed: Allergy & Precautions, NPO status , Patient's Chart, lab work & pertinent test results  Airway Mallampati: III  TM Distance: >3 FB Neck ROM: Full    Dental  (+) Dental Advisory Given   Pulmonary sleep apnea and Continuous Positive Airway Pressure Ventilation , former smoker,    breath sounds clear to auscultation       Cardiovascular negative cardio ROS   Rhythm:Regular Rate:Normal     Neuro/Psych negative neurological ROS     GI/Hepatic Neg liver ROS, GERD  ,  Endo/Other  negative endocrine ROS  Renal/GU negative Renal ROS     Musculoskeletal  (+) Arthritis ,   Abdominal   Peds  Hematology negative hematology ROS (+)   Anesthesia Other Findings   Reproductive/Obstetrics                            Anesthesia Physical Anesthesia Plan  ASA: II  Anesthesia Plan: MAC and Regional   Post-op Pain Management:    Induction: Intravenous  Airway Management Planned: Natural Airway and Simple Face Mask  Additional Equipment:   Intra-op Plan:   Post-operative Plan: Extubation in OR  Informed Consent: I have reviewed the patients History and Physical, chart, labs and discussed the procedure including the risks, benefits and alternatives for the proposed anesthesia with the patient or authorized representative who has indicated his/her understanding and acceptance.   Dental advisory given  Plan Discussed with: CRNA  Anesthesia Plan Comments:        Anesthesia Quick Evaluation

## 2017-03-03 NOTE — H&P (Signed)
Cody [name] is an 71 y.o. male.   Chief Complaint:   Left foot pain HPI: 71 y/o male with left foot pain for many months.  He has failed non op treatment and presents for left hallux MPJ resurfacing and cheilectomy.  Past Medical History:  Diagnosis Date  . Arthritis   . Cancer ([place])   . Constipation   . Environmental allergies   . GERD (gastroesophageal reflux disease)   . Headache    "bad sinus headache"  . History of shingles   . History of skin cancer   . Hyperlipidemia   . Nocturia   . Sleep apnea, obstructive    cpap    Past Surgical History:  Procedure Laterality Date  . EYE SURGERY  2016   cataracts removed  . TOTAL HIP ARTHROPLASTY Left 09/04/2015   Procedure: LEFT TOTAL HIP ARTHROPLASTY ANTERIOR APPROACH;  Surgeon: Rod Can, MD;  Location: [facility];  Service: Orthopedics;  Laterality: Left;  . TOTAL HIP ARTHROPLASTY Right 03/25/2016   Procedure: RIGHT TOTAL HIP ARTHROPLASTY ANTERIOR APPROACH;  Surgeon: Rod Can, MD;  Location: [facility];  Service: Orthopedics;  Laterality: Right;    Family History  Problem Relation Age of Onset  . Heart failure Mother   . Heart Problems Mother        pacer  . Stroke Father   . Hypertension Sister   . Other Sister        healthty  . Other Son        healthy  . Other Son        healthy   Social History:  reports that he quit smoking about 34 years ago. He has never used smokeless tobacco. He reports that he drinks alcohol. He reports that he does not use drugs.  Allergies:  Allergies  Allergen Reactions  . Codeine Other (See Comments)    "Can't take in large doses"  . Penicillins Other (See Comments)    Unknown, as a child Has patient had a PCN reaction causing immediate rash, facial/tongue/throat swelling, SOB or lightheadedness with hypotension: unknown - childhood reaction Has patient had a PCN reaction causing severe rash involving mucus membranes or skin necrosis: unknown - childhood reaction Has patient  had a PCN reaction that required hospitalization no - never hospitalized Has patient had a PCN reaction occurring within the last 10 years: no If all of the above answers are "NO", then may p    Medications Prior to Admission  Medication Sig Dispense Refill  . aspirin 81 MG chewable tablet Chew 81 mg by mouth daily.    Marland Kitchen atorvastatin (LIPITOR) 20 MG tablet Take 20 mg by mouth daily after supper.     Marland Kitchen azelastine (ASTELIN) 0.1 % nasal spray Place into both nostrils 2 (two) times daily. Use in each nostril as directed    . fluticasone (FLONASE) 50 MCG/ACT nasal spray Place 2 sprays into both nostrils daily as needed for allergies.     Marland Kitchen ibuprofen (ADVIL,MOTRIN) 200 MG tablet Take 200 mg by mouth every 6 (six) hours as needed.    Marland Kitchen omeprazole (PRILOSEC OTC) 20 MG tablet Take 1 tablet (20 mg total) by mouth 2 (two) times daily before a meal. 40 tablet 3    No results found for this or any previous visit (from the past 48 hour(s)). No results found.  ROS  No recent f/c/n/v/w tloss  Blood pressure 136/82, pulse 78, temperature 98.6 F (37 C), temperature source Oral, resp. rate 17, height  6' (1.829 m), weight 121.2 kg (267 lb 4 oz), SpO2 100 %. Physical Exam  wn wd male in nad.  A and o x 4.  Mood and affect normal.  EOMI.  resp unlabored.  L foot with healthy skin.  TTP at hallux MPJ.  Decreased ROM at the MPJ.  No lymphadenopathy.  5/5 strength inPF and DF of the ankle sn teos.  Assessment/Plan L hallux rigidus - to OR for L hallux MPJ cheilectomy and joint resurfacing.  The risks and benefits of the alternative treatment options have been discussed in detail.  The patient wishes to proceed with surgery and specifically understands risks of bleeding, infection, nerve damage, blood clots, need for additional surgery, amputation and death.   Wylene Simmer, MD 2017/03/07, 1:48 PM

## 2017-03-03 NOTE — Discharge Instructions (Addendum)
Post Anesthesia Home Care Instructions  Activity: Get plenty of rest for the remainder of the day. A responsible individual must stay with you for 24 hours following the procedure.  For the next 24 hours, DO NOT: -Drive a car -Paediatric nurse -Drink alcoholic beverages -Take any medication unless instructed by your physician -Make any legal decisions or sign important papers.  Meals: Start with liquid foods such as gelatin or soup. Progress to regular foods as tolerated. Avoid greasy, spicy, heavy foods. If nausea and/or vomiting occur, drink only clear liquids until the nausea and/or vomiting subsides. Call your physician if vomiting continues.  Special Instructions/Symptoms: Your throat may feel dry or sore from the anesthesia or the breathing tube placed in your throat during surgery. If this causes discomfort, gargle with warm salt water. The discomfort should disappear within 24 hours.  If you had a scopolamine patch placed behind your ear for the management of post- operative nausea and/or vomiting:  1. The medication in the patch is effective for 72 hours, after which it should be removed.  Wrap patch in a tissue and discard in the trash. Wash hands thoroughly with soap and water. 2. You may remove the patch earlier than 72 hours if you experience unpleasant side effects which may include dry mouth, dizziness or visual disturbances. 3. Avoid touching the patch. Wash your hands with soap and water after contact with the patch.      Regional Anesthesia Blocks  1. Numbness or the inability to move the "blocked" extremity may last from 3-48 hours after placement. The length of time depends on the medication injected and your individual response to the medication. If the numbness is not going away after 48 hours, call your surgeon.  2. The extremity that is blocked will need to be protected until the numbness is gone and the  Strength has returned. Because you cannot feel it, you  will need to take extra care to avoid injury. Because it may be weak, you may have difficulty moving it or using it. You may not know what position it is in without looking at it while the block is in effect.  3. For blocks in the legs and feet, returning to weight bearing and walking needs to be done carefully. You will need to wait until the numbness is entirely gone and the strength has returned. You should be able to move your leg and foot normally before you try and bear weight or walk. You will need someone to be with you when you first try to ensure you do not fall and possibly risk injury.  4. Bruising and tenderness at the needle site are common side effects and will resolve in a few days.  5. Persistent numbness or new problems with movement should be communicated to the surgeon or the [place] [phone] [place] [phone]).   Wylene Simmer, MD [place]  Please read the following information regarding your care after surgery.  Medications  You only need a prescription for the narcotic pain medicine (ex. oxycodone, Percocet, Norco).  All of the other medicines listed below are available over the counter. X acetominophen (Tylenol) 650 mg every 4-6 hours as you need for minor pain X oxycodone as prescribed for moderate to severe pain X Aleve two pills twice a day for 5 days after surgery  Narcotic pain medicine (ex. oxycodone, Percocet, Vicodin) will cause constipation.  To prevent this problem, take the following medicines while you are taking  any pain medicine. X docusate sodium (Colace) 100 mg twice a day X senna (Senokot) 2 tablets twice a day  Weight Bearing ? Bear weight when you are able on your operated leg or foot. X Bear weight on your operated foot in the post-op shoe. ? Do not bear any weight on the operated leg or foot.  Cast / Splint / Dressing X Keep your splint or cast clean and dry.  Dont put anything (coat  hanger, pencil, etc) down inside of it.  If it gets damp, use a hair dryer on the cool setting to dry it.  If it gets soaked, call the office to schedule an appointment for a cast change. ? Remove your dressing 3 days after surgery and cover the incisions with dry dressings.    After your dressing, cast or splint is removed; you may shower, but do not soak or scrub the wound.  Allow the water to run over it, and then gently pat it dry.  Swelling It is normal for you to have swelling where you had surgery.  To reduce swelling and pain, keep your toes above your nose for at least 3 days after surgery.  It may be necessary to keep your foot or leg elevated for several weeks.  If it hurts, it should be elevated.  Follow Up Call my office at [phone] when you are discharged from the hospital or surgery center to schedule an appointment to be seen two weeks after surgery.  Call my office at [phone] if you develop a fever >101.5 F, nausea, vomiting, bleeding from the surgical site or severe pain.

## 2017-03-03 NOTE — Anesthesia Procedure Notes (Signed)
Anesthesia Regional Block: Popliteal block   Pre-Anesthetic Checklist: ,, timeout performed, Correct Patient, Correct Site, Correct Laterality, Correct Procedure, Correct Position, site marked, Risks and benefits discussed,  Surgical consent,  Pre-op evaluation,  At surgeon's request and post-op pain management  Laterality: Left  Prep: chloraprep       Needles:  Injection technique: Single-shot  Needle Type: Echogenic Needle     Needle Length: 9cm  Needle Gauge: 21     Additional Needles:   Procedures: ultrasound guided,,,,,,,,  Narrative:  Start time: 03/03/2017 1:25 PM End time: 03/03/2017 1:33 PM Injection made incrementally with aspirations every 5 mL.  Performed by: Personally  Anesthesiologist: Suzette Battiest

## 2017-03-04 ENCOUNTER — Encounter (HOSPITAL_BASED_OUTPATIENT_CLINIC_OR_DEPARTMENT_OTHER): Payer: Self-pay | Admitting: Orthopedic Surgery

## 2017-03-04 NOTE — Anesthesia Postprocedure Evaluation (Signed)
Anesthesia Post Note  Patient: Cody [name]  Procedure(s) Performed: Procedure(s) (LRB): Left Hallux Metatarsal Phalangeal Joint Cheilectomy and Joint Resurfacing (Left)  Patient location during evaluation: PACU Anesthesia Type: General Level of consciousness: awake and alert Pain management: pain level controlled Vital Signs Assessment: post-procedure vital signs reviewed and stable Respiratory status: spontaneous breathing, nonlabored ventilation, respiratory function stable and patient connected to nasal cannula oxygen Cardiovascular status: blood pressure returned to baseline and stable Postop Assessment: no signs of nausea or vomiting Anesthetic complications: no       Last Vitals:  Vitals:   03/03/17 1515 03/03/17 1545  BP: 110/65 140/76  Pulse: 71 69  Resp: 10 16  Temp:  36.5 C    Last Pain:  Vitals:   03/03/17 1545  TempSrc:   PainSc: 0-No pain                 Tiajuana Amass

## 2017-03-04 NOTE — Op Note (Signed)
NAME:  VARDAAN, DEPASCALE NO.:  [id number]  MEDICAL RECORD NO.:  [id number]  LOCATION:                                 FACILITY:  PHYSICIAN:  Wylene Simmer, MD             DATE OF BIRTH:  DATE OF PROCEDURE:  03/03/2017 DATE OF DISCHARGE:                              OPERATIVE REPORT   PREOPERATIVE DIAGNOSIS:  Left hallux rigidus.  POSTOPERATIVE DIAGNOSIS:  Left hallux rigidus.  PROCEDURE:  Left hallux metatarsophalangeal joint cheilectomy and joint resurfacing with a 10-mm Cartiva implant.  SURGEON:  Wylene Simmer, MD  ANESTHESIA:  General, regional.  ESTIMATED BLOOD LOSS:  Minimal.  TOURNIQUET TIME:  27 minutes at 200 mmHg.  COMPLICATIONS:  None apparent.  DISPOSITION:  Extubated, awake and stable to recovery.  INDICATIONS FOR PROCEDURE:  The patient is a 71 year old male with a history of left forefoot pain now for many months.  He has failed nonoperative treatment including activity modification, oral anti- inflammatories and shoe wear modification.  He presents today for surgical treatment of this painful condition.  He understands the risks and benefits of the alternative treatment options and elects surgical treatment.  He specifically understands risks of bleeding, infection, nerve damage, blood clots, need for additional surgery, continued pain, amputation and death.  PROCEDURE IN DETAIL:  After preoperative consent was obtained and the correct operative site was identified, the patient was brought to the operating room and placed supine on the operating table.  General anesthesia was induced.  Preoperative antibiotics were administered. Surgical time-out was taken.  The left lower extremity was prepped and draped in standard sterile fashion with tourniquet around the calf.  The extremity was exsanguinated and the calf tourniquet was inflated to 200 mmHg.  Longitudinal incision was then made over the hallux MP joint. Sharp dissection was carried  down through the skin and subcutaneous tissue.  The extensor hallucis longus and brevis tendons were mobilized and retracted laterally exposing the metatarsal head.  There were significant osteophytes noted at both the head of the metatarsal and base of the proximal phalanx.  These were resected with the rongeur. The collateral ligaments were released.  The sesamoid articulations were mobilized with a Firefighter.  The head of the metatarsal was exposed and a K-wire was inserted just dorsal and medial from the center point of the head.  This position was selected to cover the greatest portion of the exposed bone.  The guidepin was inserted and the reamer was advanced over the guidepin, leaving it just proud of the shoulder stop. The guidepin and reamer were removed and all bone debris was irrigated and removed.  The Cartiva implant was then inserted to the appropriate depth.  The MTP joint was reduced.  The alignment of the toe was noted to be grossly normal.  The wound was irrigated copiously.  Dorsal joint capsule was repaired with simple sutures of 2-0 Vicryl.  Subcutaneous tissues were approximated with inverted simple sutures of 3-0 Monocryl. The skin incision was closed with a running 3-0 nylon.  Sterile dressings were applied followed by a compression wrap.  Tourniquet was released after application of the dressings  at 27 minutes.  The patient was awakened from anesthesia and transported to the recovery room in stable condition.  FOLLOWUP PLAN:  The patient will be weightbearing as tolerated on his left foot in a flat postop shoe.  He is going to follow up with me in the office in 2 weeks for suture removal and to initiate range of motion.     Wylene Simmer, MD     JH/MEDQ  D:  03/03/2017  T:  03/04/2017  Job:  [id number]

## 2017-03-23 DIAGNOSIS — M2022 Hallux rigidus, left foot: Secondary | ICD-10-CM | POA: Diagnosis not present

## 2017-04-18 DIAGNOSIS — M2022 Hallux rigidus, left foot: Secondary | ICD-10-CM | POA: Diagnosis not present

## 2017-05-31 DIAGNOSIS — Z961 Presence of intraocular lens: Secondary | ICD-10-CM | POA: Diagnosis not present

## 2017-05-31 DIAGNOSIS — H35033 Hypertensive retinopathy, bilateral: Secondary | ICD-10-CM | POA: Diagnosis not present

## 2017-05-31 DIAGNOSIS — H35363 Drusen (degenerative) of macula, bilateral: Secondary | ICD-10-CM | POA: Diagnosis not present

## 2017-05-31 DIAGNOSIS — H43813 Vitreous degeneration, bilateral: Secondary | ICD-10-CM | POA: Diagnosis not present

## 2017-06-10 DIAGNOSIS — R7301 Impaired fasting glucose: Secondary | ICD-10-CM | POA: Diagnosis not present

## 2017-06-10 DIAGNOSIS — J309 Allergic rhinitis, unspecified: Secondary | ICD-10-CM | POA: Diagnosis not present

## 2017-06-10 DIAGNOSIS — Z1389 Encounter for screening for other disorder: Secondary | ICD-10-CM | POA: Diagnosis not present

## 2017-06-10 DIAGNOSIS — K2270 Barrett's esophagus without dysplasia: Secondary | ICD-10-CM | POA: Diagnosis not present

## 2017-06-10 DIAGNOSIS — E669 Obesity, unspecified: Secondary | ICD-10-CM | POA: Diagnosis not present

## 2017-06-10 DIAGNOSIS — H9193 Unspecified hearing loss, bilateral: Secondary | ICD-10-CM | POA: Diagnosis not present

## 2017-06-10 DIAGNOSIS — Z0000 Encounter for general adult medical examination without abnormal findings: Secondary | ICD-10-CM | POA: Diagnosis not present

## 2017-06-10 DIAGNOSIS — G4733 Obstructive sleep apnea (adult) (pediatric): Secondary | ICD-10-CM | POA: Diagnosis not present

## 2017-06-10 DIAGNOSIS — Z125 Encounter for screening for malignant neoplasm of prostate: Secondary | ICD-10-CM | POA: Diagnosis not present

## 2017-06-10 DIAGNOSIS — E7800 Pure hypercholesterolemia, unspecified: Secondary | ICD-10-CM | POA: Diagnosis not present

## 2017-06-10 DIAGNOSIS — Z1211 Encounter for screening for malignant neoplasm of colon: Secondary | ICD-10-CM | POA: Diagnosis not present

## 2017-06-10 DIAGNOSIS — Z6838 Body mass index (BMI) 38.0-38.9, adult: Secondary | ICD-10-CM | POA: Diagnosis not present

## 2017-06-10 DIAGNOSIS — N183 Chronic kidney disease, stage 3 (moderate): Secondary | ICD-10-CM | POA: Diagnosis not present

## 2017-06-10 DIAGNOSIS — I700 Atherosclerosis of aorta: Secondary | ICD-10-CM | POA: Diagnosis not present

## 2017-06-13 DIAGNOSIS — G4733 Obstructive sleep apnea (adult) (pediatric): Secondary | ICD-10-CM | POA: Diagnosis not present

## 2017-06-29 DIAGNOSIS — T7840XA Allergy, unspecified, initial encounter: Secondary | ICD-10-CM | POA: Diagnosis not present

## 2017-06-29 DIAGNOSIS — R6884 Jaw pain: Secondary | ICD-10-CM | POA: Diagnosis not present

## 2017-06-29 DIAGNOSIS — J320 Chronic maxillary sinusitis: Secondary | ICD-10-CM | POA: Diagnosis not present

## 2017-06-29 DIAGNOSIS — J343 Hypertrophy of nasal turbinates: Secondary | ICD-10-CM | POA: Diagnosis not present

## 2017-06-29 DIAGNOSIS — G4733 Obstructive sleep apnea (adult) (pediatric): Secondary | ICD-10-CM | POA: Insufficient documentation

## 2017-07-01 DIAGNOSIS — J320 Chronic maxillary sinusitis: Secondary | ICD-10-CM | POA: Diagnosis not present

## 2017-07-04 DIAGNOSIS — Z4789 Encounter for other orthopedic aftercare: Secondary | ICD-10-CM | POA: Diagnosis not present

## 2017-07-04 DIAGNOSIS — M2022 Hallux rigidus, left foot: Secondary | ICD-10-CM | POA: Diagnosis not present

## 2017-09-12 DIAGNOSIS — L570 Actinic keratosis: Secondary | ICD-10-CM | POA: Diagnosis not present

## 2017-09-12 DIAGNOSIS — C44319 Basal cell carcinoma of skin of other parts of face: Secondary | ICD-10-CM | POA: Diagnosis not present

## 2017-09-12 DIAGNOSIS — D045 Carcinoma in situ of skin of trunk: Secondary | ICD-10-CM | POA: Diagnosis not present

## 2017-09-12 DIAGNOSIS — D492 Neoplasm of unspecified behavior of bone, soft tissue, and skin: Secondary | ICD-10-CM | POA: Diagnosis not present

## 2017-09-12 DIAGNOSIS — L821 Other seborrheic keratosis: Secondary | ICD-10-CM | POA: Diagnosis not present

## 2017-09-12 DIAGNOSIS — C4491 Basal cell carcinoma of skin, unspecified: Secondary | ICD-10-CM

## 2017-09-12 DIAGNOSIS — L820 Inflamed seborrheic keratosis: Secondary | ICD-10-CM | POA: Diagnosis not present

## 2017-09-12 DIAGNOSIS — D229 Melanocytic nevi, unspecified: Secondary | ICD-10-CM | POA: Diagnosis not present

## 2017-09-12 DIAGNOSIS — D044 Carcinoma in situ of skin of scalp and neck: Secondary | ICD-10-CM | POA: Diagnosis not present

## 2017-09-12 HISTORY — DX: Basal cell carcinoma of skin, unspecified: C44.91

## 2017-09-29 DIAGNOSIS — C44319 Basal cell carcinoma of skin of other parts of face: Secondary | ICD-10-CM | POA: Diagnosis not present

## 2017-09-29 DIAGNOSIS — D044 Carcinoma in situ of skin of scalp and neck: Secondary | ICD-10-CM | POA: Diagnosis not present

## 2017-09-29 DIAGNOSIS — D045 Carcinoma in situ of skin of trunk: Secondary | ICD-10-CM | POA: Diagnosis not present

## 2017-11-22 DIAGNOSIS — G4733 Obstructive sleep apnea (adult) (pediatric): Secondary | ICD-10-CM | POA: Diagnosis not present

## 2017-12-08 DIAGNOSIS — G4733 Obstructive sleep apnea (adult) (pediatric): Secondary | ICD-10-CM | POA: Diagnosis not present

## 2017-12-15 DIAGNOSIS — M1612 Unilateral primary osteoarthritis, left hip: Secondary | ICD-10-CM | POA: Diagnosis not present

## 2017-12-15 DIAGNOSIS — R269 Unspecified abnormalities of gait and mobility: Secondary | ICD-10-CM | POA: Diagnosis not present

## 2017-12-15 DIAGNOSIS — G4733 Obstructive sleep apnea (adult) (pediatric): Secondary | ICD-10-CM | POA: Diagnosis not present

## 2018-01-12 DIAGNOSIS — M1612 Unilateral primary osteoarthritis, left hip: Secondary | ICD-10-CM | POA: Diagnosis not present

## 2018-01-12 DIAGNOSIS — R269 Unspecified abnormalities of gait and mobility: Secondary | ICD-10-CM | POA: Diagnosis not present

## 2018-01-12 DIAGNOSIS — G4733 Obstructive sleep apnea (adult) (pediatric): Secondary | ICD-10-CM | POA: Diagnosis not present

## 2018-01-23 DIAGNOSIS — R269 Unspecified abnormalities of gait and mobility: Secondary | ICD-10-CM | POA: Diagnosis not present

## 2018-01-23 DIAGNOSIS — G4733 Obstructive sleep apnea (adult) (pediatric): Secondary | ICD-10-CM | POA: Diagnosis not present

## 2018-01-23 DIAGNOSIS — M1612 Unilateral primary osteoarthritis, left hip: Secondary | ICD-10-CM | POA: Diagnosis not present

## 2018-02-12 DIAGNOSIS — R269 Unspecified abnormalities of gait and mobility: Secondary | ICD-10-CM | POA: Diagnosis not present

## 2018-02-12 DIAGNOSIS — G4733 Obstructive sleep apnea (adult) (pediatric): Secondary | ICD-10-CM | POA: Diagnosis not present

## 2018-02-12 DIAGNOSIS — M1612 Unilateral primary osteoarthritis, left hip: Secondary | ICD-10-CM | POA: Diagnosis not present

## 2018-02-20 DIAGNOSIS — G4733 Obstructive sleep apnea (adult) (pediatric): Secondary | ICD-10-CM | POA: Diagnosis not present

## 2018-03-14 DIAGNOSIS — M1612 Unilateral primary osteoarthritis, left hip: Secondary | ICD-10-CM | POA: Diagnosis not present

## 2018-03-14 DIAGNOSIS — G4733 Obstructive sleep apnea (adult) (pediatric): Secondary | ICD-10-CM | POA: Diagnosis not present

## 2018-03-14 DIAGNOSIS — R269 Unspecified abnormalities of gait and mobility: Secondary | ICD-10-CM | POA: Diagnosis not present

## 2018-04-04 DIAGNOSIS — H903 Sensorineural hearing loss, bilateral: Secondary | ICD-10-CM | POA: Diagnosis not present

## 2018-04-14 DIAGNOSIS — M1612 Unilateral primary osteoarthritis, left hip: Secondary | ICD-10-CM | POA: Diagnosis not present

## 2018-04-14 DIAGNOSIS — G4733 Obstructive sleep apnea (adult) (pediatric): Secondary | ICD-10-CM | POA: Diagnosis not present

## 2018-04-14 DIAGNOSIS — R269 Unspecified abnormalities of gait and mobility: Secondary | ICD-10-CM | POA: Diagnosis not present

## 2018-04-19 DIAGNOSIS — M1612 Unilateral primary osteoarthritis, left hip: Secondary | ICD-10-CM | POA: Diagnosis not present

## 2018-04-19 DIAGNOSIS — R269 Unspecified abnormalities of gait and mobility: Secondary | ICD-10-CM | POA: Diagnosis not present

## 2018-04-19 DIAGNOSIS — G4733 Obstructive sleep apnea (adult) (pediatric): Secondary | ICD-10-CM | POA: Diagnosis not present

## 2018-05-14 DIAGNOSIS — R269 Unspecified abnormalities of gait and mobility: Secondary | ICD-10-CM | POA: Diagnosis not present

## 2018-05-14 DIAGNOSIS — M1612 Unilateral primary osteoarthritis, left hip: Secondary | ICD-10-CM | POA: Diagnosis not present

## 2018-05-14 DIAGNOSIS — G4733 Obstructive sleep apnea (adult) (pediatric): Secondary | ICD-10-CM | POA: Diagnosis not present

## 2018-06-01 DIAGNOSIS — H26492 Other secondary cataract, left eye: Secondary | ICD-10-CM | POA: Diagnosis not present

## 2018-06-01 DIAGNOSIS — Z961 Presence of intraocular lens: Secondary | ICD-10-CM | POA: Diagnosis not present

## 2018-06-01 DIAGNOSIS — H35363 Drusen (degenerative) of macula, bilateral: Secondary | ICD-10-CM | POA: Diagnosis not present

## 2018-06-01 DIAGNOSIS — H35033 Hypertensive retinopathy, bilateral: Secondary | ICD-10-CM | POA: Diagnosis not present

## 2018-06-14 DIAGNOSIS — G4733 Obstructive sleep apnea (adult) (pediatric): Secondary | ICD-10-CM | POA: Diagnosis not present

## 2018-06-14 DIAGNOSIS — R269 Unspecified abnormalities of gait and mobility: Secondary | ICD-10-CM | POA: Diagnosis not present

## 2018-06-14 DIAGNOSIS — M1612 Unilateral primary osteoarthritis, left hip: Secondary | ICD-10-CM | POA: Diagnosis not present

## 2018-06-15 DIAGNOSIS — I700 Atherosclerosis of aorta: Secondary | ICD-10-CM | POA: Diagnosis not present

## 2018-06-15 DIAGNOSIS — K219 Gastro-esophageal reflux disease without esophagitis: Secondary | ICD-10-CM | POA: Diagnosis not present

## 2018-06-15 DIAGNOSIS — M1990 Unspecified osteoarthritis, unspecified site: Secondary | ICD-10-CM | POA: Diagnosis not present

## 2018-06-15 DIAGNOSIS — G4733 Obstructive sleep apnea (adult) (pediatric): Secondary | ICD-10-CM | POA: Diagnosis not present

## 2018-06-15 DIAGNOSIS — Z1389 Encounter for screening for other disorder: Secondary | ICD-10-CM | POA: Diagnosis not present

## 2018-06-15 DIAGNOSIS — N183 Chronic kidney disease, stage 3 (moderate): Secondary | ICD-10-CM | POA: Diagnosis not present

## 2018-06-15 DIAGNOSIS — Z0000 Encounter for general adult medical examination without abnormal findings: Secondary | ICD-10-CM | POA: Diagnosis not present

## 2018-06-15 DIAGNOSIS — Z1211 Encounter for screening for malignant neoplasm of colon: Secondary | ICD-10-CM | POA: Diagnosis not present

## 2018-06-15 DIAGNOSIS — J309 Allergic rhinitis, unspecified: Secondary | ICD-10-CM | POA: Diagnosis not present

## 2018-06-15 DIAGNOSIS — R7309 Other abnormal glucose: Secondary | ICD-10-CM | POA: Diagnosis not present

## 2018-06-15 DIAGNOSIS — Z125 Encounter for screening for malignant neoplasm of prostate: Secondary | ICD-10-CM | POA: Diagnosis not present

## 2018-06-15 DIAGNOSIS — K2270 Barrett's esophagus without dysplasia: Secondary | ICD-10-CM | POA: Diagnosis not present

## 2018-06-15 DIAGNOSIS — E7800 Pure hypercholesterolemia, unspecified: Secondary | ICD-10-CM | POA: Diagnosis not present

## 2018-06-20 DIAGNOSIS — Z1211 Encounter for screening for malignant neoplasm of colon: Secondary | ICD-10-CM | POA: Diagnosis not present

## 2018-07-14 DIAGNOSIS — R972 Elevated prostate specific antigen [PSA]: Secondary | ICD-10-CM | POA: Diagnosis not present

## 2018-07-14 DIAGNOSIS — N400 Enlarged prostate without lower urinary tract symptoms: Secondary | ICD-10-CM | POA: Diagnosis not present

## 2018-07-15 DIAGNOSIS — M1612 Unilateral primary osteoarthritis, left hip: Secondary | ICD-10-CM | POA: Diagnosis not present

## 2018-07-15 DIAGNOSIS — R269 Unspecified abnormalities of gait and mobility: Secondary | ICD-10-CM | POA: Diagnosis not present

## 2018-07-15 DIAGNOSIS — G4733 Obstructive sleep apnea (adult) (pediatric): Secondary | ICD-10-CM | POA: Diagnosis not present

## 2018-08-14 DIAGNOSIS — R269 Unspecified abnormalities of gait and mobility: Secondary | ICD-10-CM | POA: Diagnosis not present

## 2018-08-14 DIAGNOSIS — G4733 Obstructive sleep apnea (adult) (pediatric): Secondary | ICD-10-CM | POA: Diagnosis not present

## 2018-08-14 DIAGNOSIS — M1612 Unilateral primary osteoarthritis, left hip: Secondary | ICD-10-CM | POA: Diagnosis not present

## 2018-09-14 DIAGNOSIS — G4733 Obstructive sleep apnea (adult) (pediatric): Secondary | ICD-10-CM | POA: Diagnosis not present

## 2018-09-14 DIAGNOSIS — M1612 Unilateral primary osteoarthritis, left hip: Secondary | ICD-10-CM | POA: Diagnosis not present

## 2018-09-14 DIAGNOSIS — R269 Unspecified abnormalities of gait and mobility: Secondary | ICD-10-CM | POA: Diagnosis not present

## 2018-10-13 DIAGNOSIS — M1612 Unilateral primary osteoarthritis, left hip: Secondary | ICD-10-CM | POA: Diagnosis not present

## 2018-10-13 DIAGNOSIS — R269 Unspecified abnormalities of gait and mobility: Secondary | ICD-10-CM | POA: Diagnosis not present

## 2018-10-13 DIAGNOSIS — G4733 Obstructive sleep apnea (adult) (pediatric): Secondary | ICD-10-CM | POA: Diagnosis not present

## 2018-10-14 DIAGNOSIS — M1612 Unilateral primary osteoarthritis, left hip: Secondary | ICD-10-CM | POA: Diagnosis not present

## 2018-10-14 DIAGNOSIS — G4733 Obstructive sleep apnea (adult) (pediatric): Secondary | ICD-10-CM | POA: Diagnosis not present

## 2018-10-14 DIAGNOSIS — R269 Unspecified abnormalities of gait and mobility: Secondary | ICD-10-CM | POA: Diagnosis not present

## 2018-11-14 DIAGNOSIS — G4733 Obstructive sleep apnea (adult) (pediatric): Secondary | ICD-10-CM | POA: Diagnosis not present

## 2018-11-14 DIAGNOSIS — R269 Unspecified abnormalities of gait and mobility: Secondary | ICD-10-CM | POA: Diagnosis not present

## 2018-11-14 DIAGNOSIS — M1612 Unilateral primary osteoarthritis, left hip: Secondary | ICD-10-CM | POA: Diagnosis not present

## 2018-11-23 DIAGNOSIS — N183 Chronic kidney disease, stage 3 (moderate): Secondary | ICD-10-CM | POA: Diagnosis not present

## 2018-11-23 DIAGNOSIS — M1990 Unspecified osteoarthritis, unspecified site: Secondary | ICD-10-CM | POA: Diagnosis not present

## 2018-12-04 DIAGNOSIS — N183 Chronic kidney disease, stage 3 (moderate): Secondary | ICD-10-CM | POA: Diagnosis not present

## 2018-12-04 DIAGNOSIS — M1990 Unspecified osteoarthritis, unspecified site: Secondary | ICD-10-CM | POA: Diagnosis not present

## 2018-12-15 DIAGNOSIS — M1612 Unilateral primary osteoarthritis, left hip: Secondary | ICD-10-CM | POA: Diagnosis not present

## 2018-12-15 DIAGNOSIS — R269 Unspecified abnormalities of gait and mobility: Secondary | ICD-10-CM | POA: Diagnosis not present

## 2018-12-15 DIAGNOSIS — G4733 Obstructive sleep apnea (adult) (pediatric): Secondary | ICD-10-CM | POA: Diagnosis not present

## 2019-01-13 DIAGNOSIS — R269 Unspecified abnormalities of gait and mobility: Secondary | ICD-10-CM | POA: Diagnosis not present

## 2019-01-13 DIAGNOSIS — M1612 Unilateral primary osteoarthritis, left hip: Secondary | ICD-10-CM | POA: Diagnosis not present

## 2019-01-13 DIAGNOSIS — G4733 Obstructive sleep apnea (adult) (pediatric): Secondary | ICD-10-CM | POA: Diagnosis not present

## 2019-02-07 DIAGNOSIS — M1990 Unspecified osteoarthritis, unspecified site: Secondary | ICD-10-CM | POA: Diagnosis not present

## 2019-02-07 DIAGNOSIS — E7800 Pure hypercholesterolemia, unspecified: Secondary | ICD-10-CM | POA: Diagnosis not present

## 2019-02-07 DIAGNOSIS — N183 Chronic kidney disease, stage 3 (moderate): Secondary | ICD-10-CM | POA: Diagnosis not present

## 2019-02-13 DIAGNOSIS — G4733 Obstructive sleep apnea (adult) (pediatric): Secondary | ICD-10-CM | POA: Diagnosis not present

## 2019-02-13 DIAGNOSIS — M1612 Unilateral primary osteoarthritis, left hip: Secondary | ICD-10-CM | POA: Diagnosis not present

## 2019-02-13 DIAGNOSIS — R269 Unspecified abnormalities of gait and mobility: Secondary | ICD-10-CM | POA: Diagnosis not present

## 2019-02-21 DIAGNOSIS — G4733 Obstructive sleep apnea (adult) (pediatric): Secondary | ICD-10-CM | POA: Diagnosis not present

## 2019-03-15 DIAGNOSIS — M1612 Unilateral primary osteoarthritis, left hip: Secondary | ICD-10-CM | POA: Diagnosis not present

## 2019-03-15 DIAGNOSIS — R269 Unspecified abnormalities of gait and mobility: Secondary | ICD-10-CM | POA: Diagnosis not present

## 2019-03-15 DIAGNOSIS — G4733 Obstructive sleep apnea (adult) (pediatric): Secondary | ICD-10-CM | POA: Diagnosis not present

## 2019-04-15 DIAGNOSIS — M1612 Unilateral primary osteoarthritis, left hip: Secondary | ICD-10-CM | POA: Diagnosis not present

## 2019-04-15 DIAGNOSIS — G4733 Obstructive sleep apnea (adult) (pediatric): Secondary | ICD-10-CM | POA: Diagnosis not present

## 2019-04-15 DIAGNOSIS — R269 Unspecified abnormalities of gait and mobility: Secondary | ICD-10-CM | POA: Diagnosis not present

## 2019-05-09 DIAGNOSIS — G4733 Obstructive sleep apnea (adult) (pediatric): Secondary | ICD-10-CM | POA: Diagnosis not present

## 2019-05-10 DIAGNOSIS — N183 Chronic kidney disease, stage 3 (moderate): Secondary | ICD-10-CM | POA: Diagnosis not present

## 2019-05-10 DIAGNOSIS — M1990 Unspecified osteoarthritis, unspecified site: Secondary | ICD-10-CM | POA: Diagnosis not present

## 2019-05-10 DIAGNOSIS — E7800 Pure hypercholesterolemia, unspecified: Secondary | ICD-10-CM | POA: Diagnosis not present

## 2019-05-15 DIAGNOSIS — M1612 Unilateral primary osteoarthritis, left hip: Secondary | ICD-10-CM | POA: Diagnosis not present

## 2019-05-15 DIAGNOSIS — G4733 Obstructive sleep apnea (adult) (pediatric): Secondary | ICD-10-CM | POA: Diagnosis not present

## 2019-05-15 DIAGNOSIS — R269 Unspecified abnormalities of gait and mobility: Secondary | ICD-10-CM | POA: Diagnosis not present

## 2019-06-04 DIAGNOSIS — Z961 Presence of intraocular lens: Secondary | ICD-10-CM | POA: Diagnosis not present

## 2019-06-04 DIAGNOSIS — H35033 Hypertensive retinopathy, bilateral: Secondary | ICD-10-CM | POA: Diagnosis not present

## 2019-06-04 DIAGNOSIS — H26493 Other secondary cataract, bilateral: Secondary | ICD-10-CM | POA: Diagnosis not present

## 2019-06-04 DIAGNOSIS — H35363 Drusen (degenerative) of macula, bilateral: Secondary | ICD-10-CM | POA: Diagnosis not present

## 2019-06-22 DIAGNOSIS — K2270 Barrett's esophagus without dysplasia: Secondary | ICD-10-CM | POA: Diagnosis not present

## 2019-06-22 DIAGNOSIS — Z0000 Encounter for general adult medical examination without abnormal findings: Secondary | ICD-10-CM | POA: Diagnosis not present

## 2019-06-22 DIAGNOSIS — J309 Allergic rhinitis, unspecified: Secondary | ICD-10-CM | POA: Diagnosis not present

## 2019-06-22 DIAGNOSIS — R972 Elevated prostate specific antigen [PSA]: Secondary | ICD-10-CM | POA: Diagnosis not present

## 2019-06-22 DIAGNOSIS — G4733 Obstructive sleep apnea (adult) (pediatric): Secondary | ICD-10-CM | POA: Diagnosis not present

## 2019-06-22 DIAGNOSIS — E7800 Pure hypercholesterolemia, unspecified: Secondary | ICD-10-CM | POA: Diagnosis not present

## 2019-06-22 DIAGNOSIS — R7309 Other abnormal glucose: Secondary | ICD-10-CM | POA: Diagnosis not present

## 2019-06-22 DIAGNOSIS — I700 Atherosclerosis of aorta: Secondary | ICD-10-CM | POA: Diagnosis not present

## 2019-06-22 DIAGNOSIS — N183 Chronic kidney disease, stage 3 (moderate): Secondary | ICD-10-CM | POA: Diagnosis not present

## 2019-06-22 DIAGNOSIS — Z1389 Encounter for screening for other disorder: Secondary | ICD-10-CM | POA: Diagnosis not present

## 2019-06-22 DIAGNOSIS — K219 Gastro-esophageal reflux disease without esophagitis: Secondary | ICD-10-CM | POA: Diagnosis not present

## 2019-06-22 DIAGNOSIS — M1990 Unspecified osteoarthritis, unspecified site: Secondary | ICD-10-CM | POA: Diagnosis not present

## 2019-07-11 DIAGNOSIS — M1990 Unspecified osteoarthritis, unspecified site: Secondary | ICD-10-CM | POA: Diagnosis not present

## 2019-07-11 DIAGNOSIS — E7800 Pure hypercholesterolemia, unspecified: Secondary | ICD-10-CM | POA: Diagnosis not present

## 2019-07-11 DIAGNOSIS — N183 Chronic kidney disease, stage 3 (moderate): Secondary | ICD-10-CM | POA: Diagnosis not present

## 2019-08-20 DIAGNOSIS — N1831 Chronic kidney disease, stage 3a: Secondary | ICD-10-CM | POA: Diagnosis not present

## 2019-08-20 DIAGNOSIS — M1990 Unspecified osteoarthritis, unspecified site: Secondary | ICD-10-CM | POA: Diagnosis not present

## 2019-08-20 DIAGNOSIS — E7800 Pure hypercholesterolemia, unspecified: Secondary | ICD-10-CM | POA: Diagnosis not present

## 2019-09-05 DIAGNOSIS — E7800 Pure hypercholesterolemia, unspecified: Secondary | ICD-10-CM | POA: Diagnosis not present

## 2019-09-05 DIAGNOSIS — M1990 Unspecified osteoarthritis, unspecified site: Secondary | ICD-10-CM | POA: Diagnosis not present

## 2019-09-05 DIAGNOSIS — N1831 Chronic kidney disease, stage 3a: Secondary | ICD-10-CM | POA: Diagnosis not present

## 2019-10-04 DIAGNOSIS — E7800 Pure hypercholesterolemia, unspecified: Secondary | ICD-10-CM | POA: Diagnosis not present

## 2019-10-04 DIAGNOSIS — M1990 Unspecified osteoarthritis, unspecified site: Secondary | ICD-10-CM | POA: Diagnosis not present

## 2019-10-30 DIAGNOSIS — D229 Melanocytic nevi, unspecified: Secondary | ICD-10-CM | POA: Diagnosis not present

## 2019-10-30 DIAGNOSIS — D045 Carcinoma in situ of skin of trunk: Secondary | ICD-10-CM | POA: Diagnosis not present

## 2019-10-30 DIAGNOSIS — L821 Other seborrheic keratosis: Secondary | ICD-10-CM | POA: Diagnosis not present

## 2019-11-05 DIAGNOSIS — M1990 Unspecified osteoarthritis, unspecified site: Secondary | ICD-10-CM | POA: Diagnosis not present

## 2019-11-05 DIAGNOSIS — E7800 Pure hypercholesterolemia, unspecified: Secondary | ICD-10-CM | POA: Diagnosis not present

## 2019-11-19 DIAGNOSIS — G4733 Obstructive sleep apnea (adult) (pediatric): Secondary | ICD-10-CM | POA: Diagnosis not present

## 2019-11-22 DIAGNOSIS — D045 Carcinoma in situ of skin of trunk: Secondary | ICD-10-CM | POA: Diagnosis not present

## 2019-11-23 ENCOUNTER — Ambulatory Visit: Payer: PPO

## 2019-11-30 ENCOUNTER — Ambulatory Visit: Payer: PPO | Attending: Internal Medicine

## 2019-11-30 DIAGNOSIS — Z23 Encounter for immunization: Secondary | ICD-10-CM

## 2019-11-30 NOTE — Progress Notes (Signed)
   Covid-19 Vaccination Clinic  Name:  KNOX GANTNER    MRN: [id number] DOB: [birth year]  11/30/2019  Mr. Emert was observed post Covid-19 immunization for 30 minutes based on pre-vaccination screening without incidence. He was provided with Vaccine Information Sheet and instruction to access the V-Safe system.   Mr. Willmarth was instructed to call 911 with any severe reactions post vaccine: Marland Kitchen Difficulty breathing  . Swelling of your face and throat  . A fast heartbeat  . A bad rash all over your body  . Dizziness and weakness    Immunizations Administered    Name Date Dose VIS Date Route   Pfizer COVID-19 Vaccine 11/30/2019 10:50 AM 0.3 mL 10/05/2019 Intramuscular   Manufacturer: [place]   Lot: [id number]   [place]: [id number]

## 2019-12-03 DIAGNOSIS — M1990 Unspecified osteoarthritis, unspecified site: Secondary | ICD-10-CM | POA: Diagnosis not present

## 2019-12-03 DIAGNOSIS — E7800 Pure hypercholesterolemia, unspecified: Secondary | ICD-10-CM | POA: Diagnosis not present

## 2019-12-26 ENCOUNTER — Ambulatory Visit: Payer: PPO | Attending: Internal Medicine

## 2019-12-26 DIAGNOSIS — Z23 Encounter for immunization: Secondary | ICD-10-CM | POA: Insufficient documentation

## 2019-12-26 NOTE — Progress Notes (Signed)
   Covid-19 Vaccination Clinic  Name:  Cody [name]    MRN: [id number] DOB: August 25, 1946  12/26/2019  [name] was observed post Covid-19 immunization for 15 minutes without incident. He was provided with Vaccine Information Sheet and instruction to access the V-Safe system.   [name] was instructed to call 911 with any severe reactions post vaccine: Marland Kitchen Difficulty breathing  . Swelling of face and throat  . A fast heartbeat  . A bad rash all over body  . Dizziness and weakness   Immunizations Administered    Name Date Dose VIS Date Route   Pfizer COVID-19 Vaccine 12/26/2019 10:44 AM 0.3 mL 10/05/2019 Intramuscular   Manufacturer: [place]   Lot: [id number]   [place]: [id number]

## 2020-01-03 DIAGNOSIS — E7800 Pure hypercholesterolemia, unspecified: Secondary | ICD-10-CM | POA: Diagnosis not present

## 2020-01-03 DIAGNOSIS — N183 Chronic kidney disease, stage 3 unspecified: Secondary | ICD-10-CM | POA: Diagnosis not present

## 2020-01-03 DIAGNOSIS — M1990 Unspecified osteoarthritis, unspecified site: Secondary | ICD-10-CM | POA: Diagnosis not present

## 2020-02-04 DIAGNOSIS — M1990 Unspecified osteoarthritis, unspecified site: Secondary | ICD-10-CM | POA: Diagnosis not present

## 2020-02-04 DIAGNOSIS — N183 Chronic kidney disease, stage 3 unspecified: Secondary | ICD-10-CM | POA: Diagnosis not present

## 2020-02-04 DIAGNOSIS — E7800 Pure hypercholesterolemia, unspecified: Secondary | ICD-10-CM | POA: Diagnosis not present

## 2020-02-18 DIAGNOSIS — G4733 Obstructive sleep apnea (adult) (pediatric): Secondary | ICD-10-CM | POA: Diagnosis not present

## 2020-03-06 DIAGNOSIS — N183 Chronic kidney disease, stage 3 unspecified: Secondary | ICD-10-CM | POA: Diagnosis not present

## 2020-03-06 DIAGNOSIS — E7800 Pure hypercholesterolemia, unspecified: Secondary | ICD-10-CM | POA: Diagnosis not present

## 2020-03-06 DIAGNOSIS — M1990 Unspecified osteoarthritis, unspecified site: Secondary | ICD-10-CM | POA: Diagnosis not present

## 2020-04-03 DIAGNOSIS — E7800 Pure hypercholesterolemia, unspecified: Secondary | ICD-10-CM | POA: Diagnosis not present

## 2020-04-03 DIAGNOSIS — N183 Chronic kidney disease, stage 3 unspecified: Secondary | ICD-10-CM | POA: Diagnosis not present

## 2020-04-03 DIAGNOSIS — M1990 Unspecified osteoarthritis, unspecified site: Secondary | ICD-10-CM | POA: Diagnosis not present

## 2020-05-06 DIAGNOSIS — E7800 Pure hypercholesterolemia, unspecified: Secondary | ICD-10-CM | POA: Diagnosis not present

## 2020-05-06 DIAGNOSIS — N183 Chronic kidney disease, stage 3 unspecified: Secondary | ICD-10-CM | POA: Diagnosis not present

## 2020-05-06 DIAGNOSIS — M1990 Unspecified osteoarthritis, unspecified site: Secondary | ICD-10-CM | POA: Diagnosis not present

## 2020-05-19 DIAGNOSIS — G4733 Obstructive sleep apnea (adult) (pediatric): Secondary | ICD-10-CM | POA: Diagnosis not present

## 2020-06-02 DIAGNOSIS — M1990 Unspecified osteoarthritis, unspecified site: Secondary | ICD-10-CM | POA: Diagnosis not present

## 2020-06-02 DIAGNOSIS — N183 Chronic kidney disease, stage 3 unspecified: Secondary | ICD-10-CM | POA: Diagnosis not present

## 2020-06-02 DIAGNOSIS — E7800 Pure hypercholesterolemia, unspecified: Secondary | ICD-10-CM | POA: Diagnosis not present

## 2020-06-05 DIAGNOSIS — H26493 Other secondary cataract, bilateral: Secondary | ICD-10-CM | POA: Diagnosis not present

## 2020-06-05 DIAGNOSIS — H35033 Hypertensive retinopathy, bilateral: Secondary | ICD-10-CM | POA: Diagnosis not present

## 2020-06-05 DIAGNOSIS — Z961 Presence of intraocular lens: Secondary | ICD-10-CM | POA: Diagnosis not present

## 2020-06-05 DIAGNOSIS — H43813 Vitreous degeneration, bilateral: Secondary | ICD-10-CM | POA: Diagnosis not present

## 2020-06-23 DIAGNOSIS — I700 Atherosclerosis of aorta: Secondary | ICD-10-CM | POA: Diagnosis not present

## 2020-06-23 DIAGNOSIS — E7800 Pure hypercholesterolemia, unspecified: Secondary | ICD-10-CM | POA: Diagnosis not present

## 2020-06-23 DIAGNOSIS — G4733 Obstructive sleep apnea (adult) (pediatric): Secondary | ICD-10-CM | POA: Diagnosis not present

## 2020-06-23 DIAGNOSIS — N183 Chronic kidney disease, stage 3 unspecified: Secondary | ICD-10-CM | POA: Diagnosis not present

## 2020-06-23 DIAGNOSIS — Z1211 Encounter for screening for malignant neoplasm of colon: Secondary | ICD-10-CM | POA: Diagnosis not present

## 2020-06-23 DIAGNOSIS — Z1389 Encounter for screening for other disorder: Secondary | ICD-10-CM | POA: Diagnosis not present

## 2020-06-23 DIAGNOSIS — H8110 Benign paroxysmal vertigo, unspecified ear: Secondary | ICD-10-CM | POA: Diagnosis not present

## 2020-06-23 DIAGNOSIS — Z6839 Body mass index (BMI) 39.0-39.9, adult: Secondary | ICD-10-CM | POA: Diagnosis not present

## 2020-06-23 DIAGNOSIS — R7309 Other abnormal glucose: Secondary | ICD-10-CM | POA: Diagnosis not present

## 2020-06-23 DIAGNOSIS — K2270 Barrett's esophagus without dysplasia: Secondary | ICD-10-CM | POA: Diagnosis not present

## 2020-06-23 DIAGNOSIS — Z0000 Encounter for general adult medical examination without abnormal findings: Secondary | ICD-10-CM | POA: Diagnosis not present

## 2020-06-24 ENCOUNTER — Ambulatory Visit: Payer: PPO | Admitting: Dermatology

## 2020-06-24 ENCOUNTER — Other Ambulatory Visit: Payer: Self-pay

## 2020-06-24 ENCOUNTER — Encounter: Payer: Self-pay | Admitting: Dermatology

## 2020-06-24 DIAGNOSIS — C44529 Squamous cell carcinoma of skin of other part of trunk: Secondary | ICD-10-CM

## 2020-07-01 ENCOUNTER — Telehealth: Payer: Self-pay

## 2020-07-01 NOTE — Telephone Encounter (Signed)
-----   Message from Lavonna Monarch, MD sent at 06/26/2020  7:17 AM EDT ----- Schedule surgery with Dr. Darene Lamer

## 2020-07-01 NOTE — Telephone Encounter (Signed)
Pathology given to patient and he has the surgery in October

## 2020-07-10 DIAGNOSIS — M1990 Unspecified osteoarthritis, unspecified site: Secondary | ICD-10-CM | POA: Diagnosis not present

## 2020-07-10 DIAGNOSIS — E7800 Pure hypercholesterolemia, unspecified: Secondary | ICD-10-CM | POA: Diagnosis not present

## 2020-07-10 DIAGNOSIS — N183 Chronic kidney disease, stage 3 unspecified: Secondary | ICD-10-CM | POA: Diagnosis not present

## 2020-07-18 DIAGNOSIS — K2270 Barrett's esophagus without dysplasia: Secondary | ICD-10-CM | POA: Diagnosis not present

## 2020-07-18 DIAGNOSIS — R194 Change in bowel habit: Secondary | ICD-10-CM | POA: Diagnosis not present

## 2020-07-19 ENCOUNTER — Encounter: Payer: Self-pay | Admitting: Dermatology

## 2020-07-19 NOTE — Progress Notes (Signed)
   Follow-Up Visit   Subjective  Cody [name] is a 74 y.o. male who presents for the following: Skin Tag (noticed for 2-3 months--mid back--tender to the touch).  Growth Location: Right back Duration:  Quality:  Associated Signs/Symptoms: Sore Modifying Factors:  Severity:  Timing: Context:   Objective  Well appearing patient in no apparent distress; mood and affect are within normal limits.  All skin waist up examined.   Assessment & Plan    Squamous cell carcinoma of skin of other part of trunk Right Upper Back  Destruction of lesion Complexity: simple   Destruction method: electrodesiccation and curettage   Informed consent: discussed and consent obtained   Timeout:  patient name, date of birth, surgical site, and procedure verified Anesthesia: the lesion was anesthetized in a standard fashion   Anesthetic:  1% lidocaine w/ epinephrine 1-100,000 local infiltration Curettage performed in three different directions: Yes   Curettage cycles:  3 Lesion length (cm):  1.1 Lesion width (cm):  1 Margin per side (cm):  0 Final wound size (cm):  1.1 Hemostasis achieved with:  ferric subsulfate Outcome: patient tolerated procedure well with no complications   Post-procedure details: wound care instructions given   Additional details:  Inoculated with parenteral 5% fluorouracil  Specimen 1 - Surgical pathology Differential Diagnosis: BCC SCC  Check Margins: No     I, Lavonna Monarch, MD, have reviewed all documentation for this visit.  The documentation on 07/19/20 for the exam, diagnosis, procedures, and orders are all accurate and complete.

## 2020-08-07 ENCOUNTER — Ambulatory Visit (INDEPENDENT_AMBULATORY_CARE_PROVIDER_SITE_OTHER): Payer: PPO | Admitting: Dermatology

## 2020-08-07 ENCOUNTER — Encounter: Payer: Self-pay | Admitting: Dermatology

## 2020-08-07 ENCOUNTER — Other Ambulatory Visit: Payer: Self-pay

## 2020-08-07 DIAGNOSIS — C44529 Squamous cell carcinoma of skin of other part of trunk: Secondary | ICD-10-CM

## 2020-08-07 DIAGNOSIS — C4492 Squamous cell carcinoma of skin, unspecified: Secondary | ICD-10-CM

## 2020-08-07 NOTE — Patient Instructions (Signed)

## 2020-08-08 DIAGNOSIS — M1990 Unspecified osteoarthritis, unspecified site: Secondary | ICD-10-CM | POA: Diagnosis not present

## 2020-08-08 DIAGNOSIS — N183 Chronic kidney disease, stage 3 unspecified: Secondary | ICD-10-CM | POA: Diagnosis not present

## 2020-08-08 DIAGNOSIS — E7800 Pure hypercholesterolemia, unspecified: Secondary | ICD-10-CM | POA: Diagnosis not present

## 2020-09-04 DIAGNOSIS — M1990 Unspecified osteoarthritis, unspecified site: Secondary | ICD-10-CM | POA: Diagnosis not present

## 2020-09-04 DIAGNOSIS — E7800 Pure hypercholesterolemia, unspecified: Secondary | ICD-10-CM | POA: Diagnosis not present

## 2020-09-04 DIAGNOSIS — N183 Chronic kidney disease, stage 3 unspecified: Secondary | ICD-10-CM | POA: Diagnosis not present

## 2020-09-05 DIAGNOSIS — Z1159 Encounter for screening for other viral diseases: Secondary | ICD-10-CM | POA: Diagnosis not present

## 2020-09-10 DIAGNOSIS — R194 Change in bowel habit: Secondary | ICD-10-CM | POA: Diagnosis not present

## 2020-09-10 DIAGNOSIS — K2270 Barrett's esophagus without dysplasia: Secondary | ICD-10-CM | POA: Diagnosis not present

## 2020-09-10 DIAGNOSIS — B9681 Helicobacter pylori [H. pylori] as the cause of diseases classified elsewhere: Secondary | ICD-10-CM | POA: Diagnosis not present

## 2020-09-10 DIAGNOSIS — D122 Benign neoplasm of ascending colon: Secondary | ICD-10-CM | POA: Diagnosis not present

## 2020-09-10 DIAGNOSIS — K2930 Chronic superficial gastritis without bleeding: Secondary | ICD-10-CM | POA: Diagnosis not present

## 2020-09-15 DIAGNOSIS — D122 Benign neoplasm of ascending colon: Secondary | ICD-10-CM | POA: Diagnosis not present

## 2020-09-15 DIAGNOSIS — K2930 Chronic superficial gastritis without bleeding: Secondary | ICD-10-CM | POA: Diagnosis not present

## 2020-09-15 DIAGNOSIS — B9681 Helicobacter pylori [H. pylori] as the cause of diseases classified elsewhere: Secondary | ICD-10-CM | POA: Diagnosis not present

## 2020-09-15 DIAGNOSIS — K2270 Barrett's esophagus without dysplasia: Secondary | ICD-10-CM | POA: Diagnosis not present

## 2020-09-25 NOTE — Progress Notes (Signed)
   Follow-Up Visit   Subjective  Cody [name] is a 74 y.o. male who presents for the following: Procedure (SCC on right upper back).  SCCA Location: Upper back Duration:  Quality:  Associated Signs/Symptoms: Modifying Factors:  Severity:  Timing: Context:   Objective  Well appearing patient in no apparent distress; mood and affect are within normal limits.  All skin waist up examined.   Assessment & Plan    Squamous cell carcinoma of skin Right Upper Back  Destruction of lesion Complexity: simple   Destruction method: electrodesiccation and curettage   Informed consent: discussed and consent obtained   Timeout:  patient name, date of birth, surgical site, and procedure verified Anesthesia: the lesion was anesthetized in a standard fashion   Anesthetic:  1% lidocaine w/ epinephrine 1-100,000 local infiltration Curettage performed in three different directions: Yes   Curettage cycles:  3 Lesion length (cm):  1 Lesion width (cm):  1.5 Margin per side (cm):  0 Final wound size (cm):  1.5 Hemostasis achieved with:  ferric subsulfate Outcome: patient tolerated procedure well with no complications   Additional details:  Wound innoculated with 5 fluorouracil solution.  Curet showed NO deep extension.     I, Lavonna Monarch, MD, have reviewed all documentation for this visit.  The documentation on 09/25/20 for the exam, diagnosis, procedures, and orders are all accurate and complete.

## 2020-10-07 DIAGNOSIS — E7800 Pure hypercholesterolemia, unspecified: Secondary | ICD-10-CM | POA: Diagnosis not present

## 2020-10-07 DIAGNOSIS — M1990 Unspecified osteoarthritis, unspecified site: Secondary | ICD-10-CM | POA: Diagnosis not present

## 2020-10-07 DIAGNOSIS — N183 Chronic kidney disease, stage 3 unspecified: Secondary | ICD-10-CM | POA: Diagnosis not present

## 2020-10-16 DIAGNOSIS — R109 Unspecified abdominal pain: Secondary | ICD-10-CM | POA: Diagnosis not present

## 2020-11-11 ENCOUNTER — Ambulatory Visit: Payer: PPO | Admitting: Dermatology

## 2020-11-11 DIAGNOSIS — E7800 Pure hypercholesterolemia, unspecified: Secondary | ICD-10-CM | POA: Diagnosis not present

## 2020-11-11 DIAGNOSIS — M1990 Unspecified osteoarthritis, unspecified site: Secondary | ICD-10-CM | POA: Diagnosis not present

## 2020-11-11 DIAGNOSIS — N183 Chronic kidney disease, stage 3 unspecified: Secondary | ICD-10-CM | POA: Diagnosis not present

## 2020-11-27 ENCOUNTER — Encounter: Payer: Self-pay | Admitting: Dermatology

## 2020-11-27 ENCOUNTER — Other Ambulatory Visit: Payer: Self-pay

## 2020-11-27 ENCOUNTER — Ambulatory Visit: Payer: PPO | Admitting: Dermatology

## 2020-11-27 DIAGNOSIS — B079 Viral wart, unspecified: Secondary | ICD-10-CM

## 2020-11-27 DIAGNOSIS — D0462 Carcinoma in situ of skin of left upper limb, including shoulder: Secondary | ICD-10-CM

## 2020-11-27 DIAGNOSIS — Z8589 Personal history of malignant neoplasm of other organs and systems: Secondary | ICD-10-CM

## 2020-11-27 DIAGNOSIS — D0461 Carcinoma in situ of skin of right upper limb, including shoulder: Secondary | ICD-10-CM

## 2020-11-27 DIAGNOSIS — Z85828 Personal history of other malignant neoplasm of skin: Secondary | ICD-10-CM

## 2020-11-27 DIAGNOSIS — D485 Neoplasm of uncertain behavior of skin: Secondary | ICD-10-CM

## 2020-11-27 NOTE — Patient Instructions (Signed)

## 2020-12-04 DIAGNOSIS — G4733 Obstructive sleep apnea (adult) (pediatric): Secondary | ICD-10-CM | POA: Diagnosis not present

## 2020-12-05 DIAGNOSIS — K2270 Barrett's esophagus without dysplasia: Secondary | ICD-10-CM | POA: Diagnosis not present

## 2020-12-05 DIAGNOSIS — R194 Change in bowel habit: Secondary | ICD-10-CM | POA: Diagnosis not present

## 2020-12-05 DIAGNOSIS — Z86010 Personal history of colonic polyps: Secondary | ICD-10-CM | POA: Diagnosis not present

## 2020-12-05 DIAGNOSIS — A048 Other specified bacterial intestinal infections: Secondary | ICD-10-CM | POA: Diagnosis not present

## 2020-12-07 ENCOUNTER — Encounter: Payer: Self-pay | Admitting: Dermatology

## 2020-12-07 NOTE — Progress Notes (Signed)
Follow-Up Visit   Subjective  Cody [name] is a 75 y.o. male who presents for the following: Follow-up (Patient here today for 3 month follow up from spot treated on right upper back. Per patient healed well. No new concerns, patient wants to know if he should continue to use the topical cream he was given for his legs in the past?).  New crusts neck, arm, shoulder Location:  Duration:  Quality:  Associated Signs/Symptoms: Modifying Factors:  Severity:  Timing: Context: History of multiple nonmelanoma skin cancers  Objective  Well appearing patient in no apparent distress; mood and affect are within normal limits. Objective  Right Upper Back: No sign recurrence  Objective  Right Submandibular Area: Verrucous 6 mm crusted papule       Objective  Right Anterior Neck: Verrucous 7 mm crusted papule       Objective  Left Shoulder - Posterior: Superficially eroded 1 cm pink crust       Objective  Right Forearm - Posterior: Pink-brown irregular flat crust, dermoscopy compatible with superficial SCCA with pigmentation        All skin waist up examined.   Assessment & Plan    History of squamous cell carcinoma Right Upper Back  Recheck as needed  Neoplasm of uncertain behavior of skin (2) Right Submandibular Area  Skin / nail biopsy Type of biopsy: tangential   Informed consent: discussed and consent obtained   Timeout: patient name, date of birth, surgical site, and procedure verified   Procedure prep:  Patient was prepped and draped in usual sterile fashion (Non sterile) Prep type:  Chlorhexidine Anesthesia: the lesion was anesthetized in a standard fashion   Anesthetic:  1% lidocaine w/ epinephrine 1-100,000 local infiltration Instrument used: flexible razor blade   Hemostasis achieved with: ferric subsulfate and electrodesiccation   Outcome: patient tolerated procedure well   Post-procedure details: sterile dressing applied and  wound care instructions given   Dressing type: bandage and petrolatum    Specimen 3 - Surgical pathology Differential Diagnosis: R/O Wart  Check Margins: No  Cautery after biopsy  Right Anterior Neck  Skin / nail biopsy Type of biopsy: tangential   Informed consent: discussed and consent obtained   Timeout: patient name, date of birth, surgical site, and procedure verified   Procedure prep:  Patient was prepped and draped in usual sterile fashion (Non sterile) Prep type:  Chlorhexidine Anesthesia: the lesion was anesthetized in a standard fashion   Anesthetic:  1% lidocaine w/ epinephrine 1-100,000 local infiltration Instrument used: flexible razor blade   Hemostasis achieved with: ferric subsulfate   Outcome: patient tolerated procedure well   Post-procedure details: sterile dressing applied and wound care instructions given   Dressing type: bandage and petrolatum    Specimen 4 - Surgical pathology Differential Diagnosis: R/O Wart  Check Margins: No  Cautery after biopsy  Carcinoma in situ of skin of left upper extremity including shoulder Left Shoulder - Posterior  Skin / nail biopsy Type of biopsy: tangential   Informed consent: discussed and consent obtained   Timeout: patient name, date of birth, surgical site, and procedure verified   Procedure prep:  Patient was prepped and draped in usual sterile fashion (Non sterile) Prep type:  Chlorhexidine Anesthesia: the lesion was anesthetized in a standard fashion   Anesthetic:  1% lidocaine w/ epinephrine 1-100,000 local infiltration Instrument used: flexible razor blade   Hemostasis achieved with: ferric subsulfate   Outcome: patient tolerated procedure well   Post-procedure  details: sterile dressing applied and wound care instructions given   Dressing type: bandage and petrolatum    Destruction of lesion Complexity: simple   Destruction method: electrodesiccation and curettage   Informed consent: discussed and  consent obtained   Timeout:  patient name, date of birth, surgical site, and procedure verified Anesthesia: the lesion was anesthetized in a standard fashion   Anesthetic:  1% lidocaine w/ epinephrine 1-100,000 local infiltration Curettage performed in three different directions: Yes   Curettage cycles:  1 Lesion length (cm):  1.5 Lesion width (cm):  1.5 Margin per side (cm):  0 Final wound size (cm):  1.5 Hemostasis achieved with:  ferric subsulfate Outcome: patient tolerated procedure well with no complications   Post-procedure details: wound care instructions given   Additional details:  Wound innoculated with 5 fluorouracil solution.  Specimen 1 - Surgical pathology Differential Diagnosis: R/O BCC vs SCC  Check Margins: No  Treated after biopsy  After shave biopsy lesion curetted cauterized x3 and base treated with parenteral 5-FU  Carcinoma in situ of skin of right upper extremity including shoulder Right Forearm - Posterior  Skin / nail biopsy Type of biopsy: tangential   Informed consent: discussed and consent obtained   Timeout: patient name, date of birth, surgical site, and procedure verified   Procedure prep:  Patient was prepped and draped in usual sterile fashion (Non sterile) Prep type:  Chlorhexidine Anesthesia: the lesion was anesthetized in a standard fashion   Anesthetic:  1% lidocaine w/ epinephrine 1-100,000 local infiltration Instrument used: flexible razor blade   Hemostasis achieved with: ferric subsulfate   Outcome: patient tolerated procedure well   Post-procedure details: sterile dressing applied and wound care instructions given   Dressing type: bandage and petrolatum    Destruction of lesion Complexity: simple   Destruction method: electrodesiccation and curettage   Informed consent: discussed and consent obtained   Timeout:  patient name, date of birth, surgical site, and procedure verified Anesthesia: the lesion was anesthetized in a  standard fashion   Anesthetic:  1% lidocaine w/ epinephrine 1-100,000 local infiltration Curettage performed in three different directions: Yes   Curettage cycles:  1 Lesion length (cm):  1.3 Lesion width (cm):  1.3 Margin per side (cm):  0 Final wound size (cm):  1.3 Hemostasis achieved with:  ferric subsulfate Outcome: patient tolerated procedure well with no complications   Additional details:  Wound innoculated with 5 fluorouracil solution.  Specimen 2 - Surgical pathology Differential Diagnosis: R/O BCC vs SCC  Check Margins: No  Treated after biopsy  After shave biopsy lesion curetted, cauterized, treated with parenteral 5-FU     I, Lavonna Monarch, MD, have reviewed all documentation for this visit.  The documentation on 12/07/20 for the exam, diagnosis, procedures, and orders are all accurate and complete.

## 2020-12-19 DIAGNOSIS — N183 Chronic kidney disease, stage 3 unspecified: Secondary | ICD-10-CM | POA: Diagnosis not present

## 2020-12-19 DIAGNOSIS — I10 Essential (primary) hypertension: Secondary | ICD-10-CM | POA: Diagnosis not present

## 2020-12-19 DIAGNOSIS — E7800 Pure hypercholesterolemia, unspecified: Secondary | ICD-10-CM | POA: Diagnosis not present

## 2020-12-19 DIAGNOSIS — M1990 Unspecified osteoarthritis, unspecified site: Secondary | ICD-10-CM | POA: Diagnosis not present

## 2020-12-22 DIAGNOSIS — G4733 Obstructive sleep apnea (adult) (pediatric): Secondary | ICD-10-CM | POA: Diagnosis not present

## 2020-12-22 DIAGNOSIS — I10 Essential (primary) hypertension: Secondary | ICD-10-CM | POA: Diagnosis not present

## 2020-12-22 DIAGNOSIS — K2270 Barrett's esophagus without dysplasia: Secondary | ICD-10-CM | POA: Diagnosis not present

## 2020-12-22 DIAGNOSIS — J309 Allergic rhinitis, unspecified: Secondary | ICD-10-CM | POA: Diagnosis not present

## 2020-12-22 DIAGNOSIS — I700 Atherosclerosis of aorta: Secondary | ICD-10-CM | POA: Diagnosis not present

## 2020-12-22 DIAGNOSIS — N183 Chronic kidney disease, stage 3 unspecified: Secondary | ICD-10-CM | POA: Diagnosis not present

## 2020-12-22 DIAGNOSIS — E7800 Pure hypercholesterolemia, unspecified: Secondary | ICD-10-CM | POA: Diagnosis not present

## 2020-12-22 DIAGNOSIS — R7303 Prediabetes: Secondary | ICD-10-CM | POA: Diagnosis not present

## 2021-01-13 DIAGNOSIS — N183 Chronic kidney disease, stage 3 unspecified: Secondary | ICD-10-CM | POA: Diagnosis not present

## 2021-01-13 DIAGNOSIS — E7800 Pure hypercholesterolemia, unspecified: Secondary | ICD-10-CM | POA: Diagnosis not present

## 2021-01-13 DIAGNOSIS — M1990 Unspecified osteoarthritis, unspecified site: Secondary | ICD-10-CM | POA: Diagnosis not present

## 2021-01-13 DIAGNOSIS — I10 Essential (primary) hypertension: Secondary | ICD-10-CM | POA: Diagnosis not present

## 2021-01-19 DIAGNOSIS — R7303 Prediabetes: Secondary | ICD-10-CM | POA: Diagnosis not present

## 2021-01-19 DIAGNOSIS — B9681 Helicobacter pylori [H. pylori] as the cause of diseases classified elsewhere: Secondary | ICD-10-CM | POA: Diagnosis not present

## 2021-01-19 DIAGNOSIS — I10 Essential (primary) hypertension: Secondary | ICD-10-CM | POA: Diagnosis not present

## 2021-02-09 DIAGNOSIS — N183 Chronic kidney disease, stage 3 unspecified: Secondary | ICD-10-CM | POA: Diagnosis not present

## 2021-02-09 DIAGNOSIS — M1990 Unspecified osteoarthritis, unspecified site: Secondary | ICD-10-CM | POA: Diagnosis not present

## 2021-02-09 DIAGNOSIS — I10 Essential (primary) hypertension: Secondary | ICD-10-CM | POA: Diagnosis not present

## 2021-02-09 DIAGNOSIS — E7800 Pure hypercholesterolemia, unspecified: Secondary | ICD-10-CM | POA: Diagnosis not present

## 2021-02-23 DIAGNOSIS — G4733 Obstructive sleep apnea (adult) (pediatric): Secondary | ICD-10-CM | POA: Diagnosis not present

## 2021-03-18 DIAGNOSIS — Z86010 Personal history of colonic polyps: Secondary | ICD-10-CM | POA: Diagnosis not present

## 2021-03-18 DIAGNOSIS — K5900 Constipation, unspecified: Secondary | ICD-10-CM | POA: Diagnosis not present

## 2021-03-18 DIAGNOSIS — K2270 Barrett's esophagus without dysplasia: Secondary | ICD-10-CM | POA: Diagnosis not present

## 2021-04-06 DIAGNOSIS — I10 Essential (primary) hypertension: Secondary | ICD-10-CM | POA: Diagnosis not present

## 2021-04-06 DIAGNOSIS — N183 Chronic kidney disease, stage 3 unspecified: Secondary | ICD-10-CM | POA: Diagnosis not present

## 2021-04-06 DIAGNOSIS — E7800 Pure hypercholesterolemia, unspecified: Secondary | ICD-10-CM | POA: Diagnosis not present

## 2021-04-06 DIAGNOSIS — M1990 Unspecified osteoarthritis, unspecified site: Secondary | ICD-10-CM | POA: Diagnosis not present

## 2021-04-27 DIAGNOSIS — Z03818 Encounter for observation for suspected exposure to other biological agents ruled out: Secondary | ICD-10-CM | POA: Diagnosis not present

## 2021-04-27 DIAGNOSIS — H6983 Other specified disorders of Eustachian tube, bilateral: Secondary | ICD-10-CM | POA: Diagnosis not present

## 2021-04-27 DIAGNOSIS — R0981 Nasal congestion: Secondary | ICD-10-CM | POA: Diagnosis not present

## 2021-04-29 DIAGNOSIS — E7800 Pure hypercholesterolemia, unspecified: Secondary | ICD-10-CM | POA: Diagnosis not present

## 2021-04-29 DIAGNOSIS — M1990 Unspecified osteoarthritis, unspecified site: Secondary | ICD-10-CM | POA: Diagnosis not present

## 2021-04-29 DIAGNOSIS — I10 Essential (primary) hypertension: Secondary | ICD-10-CM | POA: Diagnosis not present

## 2021-04-29 DIAGNOSIS — N183 Chronic kidney disease, stage 3 unspecified: Secondary | ICD-10-CM | POA: Diagnosis not present

## 2021-06-02 DIAGNOSIS — G4733 Obstructive sleep apnea (adult) (pediatric): Secondary | ICD-10-CM | POA: Diagnosis not present

## 2021-06-04 DIAGNOSIS — Z961 Presence of intraocular lens: Secondary | ICD-10-CM | POA: Diagnosis not present

## 2021-06-04 DIAGNOSIS — H524 Presbyopia: Secondary | ICD-10-CM | POA: Diagnosis not present

## 2021-06-04 DIAGNOSIS — H35363 Drusen (degenerative) of macula, bilateral: Secondary | ICD-10-CM | POA: Diagnosis not present

## 2021-06-04 DIAGNOSIS — H26493 Other secondary cataract, bilateral: Secondary | ICD-10-CM | POA: Diagnosis not present

## 2021-06-04 DIAGNOSIS — H35033 Hypertensive retinopathy, bilateral: Secondary | ICD-10-CM | POA: Diagnosis not present

## 2021-06-24 DIAGNOSIS — Z0000 Encounter for general adult medical examination without abnormal findings: Secondary | ICD-10-CM | POA: Diagnosis not present

## 2021-06-24 DIAGNOSIS — H9193 Unspecified hearing loss, bilateral: Secondary | ICD-10-CM | POA: Diagnosis not present

## 2021-06-24 DIAGNOSIS — K2270 Barrett's esophagus without dysplasia: Secondary | ICD-10-CM | POA: Diagnosis not present

## 2021-06-24 DIAGNOSIS — I10 Essential (primary) hypertension: Secondary | ICD-10-CM | POA: Diagnosis not present

## 2021-06-24 DIAGNOSIS — R7303 Prediabetes: Secondary | ICD-10-CM | POA: Diagnosis not present

## 2021-06-24 DIAGNOSIS — Z125 Encounter for screening for malignant neoplasm of prostate: Secondary | ICD-10-CM | POA: Diagnosis not present

## 2021-06-24 DIAGNOSIS — E7800 Pure hypercholesterolemia, unspecified: Secondary | ICD-10-CM | POA: Diagnosis not present

## 2021-06-24 DIAGNOSIS — N183 Chronic kidney disease, stage 3 unspecified: Secondary | ICD-10-CM | POA: Diagnosis not present

## 2021-06-24 DIAGNOSIS — Z1389 Encounter for screening for other disorder: Secondary | ICD-10-CM | POA: Diagnosis not present

## 2021-06-24 DIAGNOSIS — G4733 Obstructive sleep apnea (adult) (pediatric): Secondary | ICD-10-CM | POA: Diagnosis not present

## 2021-06-24 DIAGNOSIS — I700 Atherosclerosis of aorta: Secondary | ICD-10-CM | POA: Diagnosis not present

## 2021-07-18 DIAGNOSIS — H60312 Diffuse otitis externa, left ear: Secondary | ICD-10-CM | POA: Diagnosis not present

## 2021-07-18 DIAGNOSIS — R0981 Nasal congestion: Secondary | ICD-10-CM | POA: Diagnosis not present

## 2021-07-18 DIAGNOSIS — J019 Acute sinusitis, unspecified: Secondary | ICD-10-CM | POA: Diagnosis not present

## 2021-07-28 DIAGNOSIS — H6122 Impacted cerumen, left ear: Secondary | ICD-10-CM | POA: Diagnosis not present

## 2021-08-10 DIAGNOSIS — N183 Chronic kidney disease, stage 3 unspecified: Secondary | ICD-10-CM | POA: Diagnosis not present

## 2021-08-10 DIAGNOSIS — E7800 Pure hypercholesterolemia, unspecified: Secondary | ICD-10-CM | POA: Diagnosis not present

## 2021-08-10 DIAGNOSIS — I10 Essential (primary) hypertension: Secondary | ICD-10-CM | POA: Diagnosis not present

## 2021-08-10 DIAGNOSIS — M1990 Unspecified osteoarthritis, unspecified site: Secondary | ICD-10-CM | POA: Diagnosis not present

## 2021-08-10 DIAGNOSIS — H35033 Hypertensive retinopathy, bilateral: Secondary | ICD-10-CM | POA: Diagnosis not present

## 2021-09-08 DIAGNOSIS — G4733 Obstructive sleep apnea (adult) (pediatric): Secondary | ICD-10-CM | POA: Diagnosis not present

## 2021-10-06 DIAGNOSIS — H35033 Hypertensive retinopathy, bilateral: Secondary | ICD-10-CM | POA: Diagnosis not present

## 2021-10-06 DIAGNOSIS — M1990 Unspecified osteoarthritis, unspecified site: Secondary | ICD-10-CM | POA: Diagnosis not present

## 2021-10-06 DIAGNOSIS — E7800 Pure hypercholesterolemia, unspecified: Secondary | ICD-10-CM | POA: Diagnosis not present

## 2021-10-06 DIAGNOSIS — I10 Essential (primary) hypertension: Secondary | ICD-10-CM | POA: Diagnosis not present

## 2021-10-06 DIAGNOSIS — N183 Chronic kidney disease, stage 3 unspecified: Secondary | ICD-10-CM | POA: Diagnosis not present

## 2021-11-03 DIAGNOSIS — M1990 Unspecified osteoarthritis, unspecified site: Secondary | ICD-10-CM | POA: Diagnosis not present

## 2021-11-03 DIAGNOSIS — E7800 Pure hypercholesterolemia, unspecified: Secondary | ICD-10-CM | POA: Diagnosis not present

## 2021-11-03 DIAGNOSIS — I10 Essential (primary) hypertension: Secondary | ICD-10-CM | POA: Diagnosis not present

## 2021-11-03 DIAGNOSIS — N183 Chronic kidney disease, stage 3 unspecified: Secondary | ICD-10-CM | POA: Diagnosis not present

## 2021-11-03 DIAGNOSIS — H35033 Hypertensive retinopathy, bilateral: Secondary | ICD-10-CM | POA: Diagnosis not present

## 2021-11-21 DIAGNOSIS — J0181 Other acute recurrent sinusitis: Secondary | ICD-10-CM | POA: Diagnosis not present

## 2021-12-24 DIAGNOSIS — J069 Acute upper respiratory infection, unspecified: Secondary | ICD-10-CM | POA: Diagnosis not present

## 2021-12-24 DIAGNOSIS — R5383 Other fatigue: Secondary | ICD-10-CM | POA: Diagnosis not present

## 2021-12-24 DIAGNOSIS — R058 Other specified cough: Secondary | ICD-10-CM | POA: Diagnosis not present

## 2021-12-24 DIAGNOSIS — R197 Diarrhea, unspecified: Secondary | ICD-10-CM | POA: Diagnosis not present

## 2021-12-24 DIAGNOSIS — U071 COVID-19: Secondary | ICD-10-CM | POA: Diagnosis not present

## 2022-01-06 DIAGNOSIS — G4733 Obstructive sleep apnea (adult) (pediatric): Secondary | ICD-10-CM | POA: Diagnosis not present

## 2022-03-01 DIAGNOSIS — G4733 Obstructive sleep apnea (adult) (pediatric): Secondary | ICD-10-CM | POA: Diagnosis not present

## 2022-04-13 DIAGNOSIS — G4733 Obstructive sleep apnea (adult) (pediatric): Secondary | ICD-10-CM | POA: Diagnosis not present

## 2022-05-05 DIAGNOSIS — Z570 Occupational exposure to noise: Secondary | ICD-10-CM | POA: Diagnosis not present

## 2022-05-05 DIAGNOSIS — Z822 Family history of deafness and hearing loss: Secondary | ICD-10-CM | POA: Diagnosis not present

## 2022-05-05 DIAGNOSIS — H9313 Tinnitus, bilateral: Secondary | ICD-10-CM | POA: Diagnosis not present

## 2022-05-05 DIAGNOSIS — H903 Sensorineural hearing loss, bilateral: Secondary | ICD-10-CM | POA: Diagnosis not present

## 2022-06-29 DIAGNOSIS — Z0000 Encounter for general adult medical examination without abnormal findings: Secondary | ICD-10-CM | POA: Diagnosis not present

## 2022-06-29 DIAGNOSIS — Z85828 Personal history of other malignant neoplasm of skin: Secondary | ICD-10-CM | POA: Diagnosis not present

## 2022-06-29 DIAGNOSIS — N183 Chronic kidney disease, stage 3 unspecified: Secondary | ICD-10-CM | POA: Diagnosis not present

## 2022-06-29 DIAGNOSIS — K2270 Barrett's esophagus without dysplasia: Secondary | ICD-10-CM | POA: Diagnosis not present

## 2022-06-29 DIAGNOSIS — R7303 Prediabetes: Secondary | ICD-10-CM | POA: Diagnosis not present

## 2022-06-29 DIAGNOSIS — K5900 Constipation, unspecified: Secondary | ICD-10-CM | POA: Diagnosis not present

## 2022-06-29 DIAGNOSIS — I10 Essential (primary) hypertension: Secondary | ICD-10-CM | POA: Diagnosis not present

## 2022-06-29 DIAGNOSIS — I700 Atherosclerosis of aorta: Secondary | ICD-10-CM | POA: Diagnosis not present

## 2022-06-29 DIAGNOSIS — E7800 Pure hypercholesterolemia, unspecified: Secondary | ICD-10-CM | POA: Diagnosis not present

## 2022-06-29 DIAGNOSIS — G4733 Obstructive sleep apnea (adult) (pediatric): Secondary | ICD-10-CM | POA: Diagnosis not present

## 2022-06-29 DIAGNOSIS — Z125 Encounter for screening for malignant neoplasm of prostate: Secondary | ICD-10-CM | POA: Diagnosis not present

## 2022-06-29 DIAGNOSIS — H9193 Unspecified hearing loss, bilateral: Secondary | ICD-10-CM | POA: Diagnosis not present

## 2022-07-15 DIAGNOSIS — G4733 Obstructive sleep apnea (adult) (pediatric): Secondary | ICD-10-CM | POA: Diagnosis not present

## 2022-08-10 DIAGNOSIS — B078 Other viral warts: Secondary | ICD-10-CM | POA: Diagnosis not present

## 2022-08-10 DIAGNOSIS — Z1283 Encounter for screening for malignant neoplasm of skin: Secondary | ICD-10-CM | POA: Diagnosis not present

## 2022-08-10 DIAGNOSIS — L821 Other seborrheic keratosis: Secondary | ICD-10-CM | POA: Diagnosis not present

## 2022-08-10 DIAGNOSIS — X32XXXA Exposure to sunlight, initial encounter: Secondary | ICD-10-CM | POA: Diagnosis not present

## 2022-08-10 DIAGNOSIS — L570 Actinic keratosis: Secondary | ICD-10-CM | POA: Diagnosis not present

## 2022-09-20 DIAGNOSIS — H6991 Unspecified Eustachian tube disorder, right ear: Secondary | ICD-10-CM | POA: Diagnosis not present

## 2022-09-20 DIAGNOSIS — J0100 Acute maxillary sinusitis, unspecified: Secondary | ICD-10-CM | POA: Diagnosis not present

## 2022-10-05 DIAGNOSIS — H43813 Vitreous degeneration, bilateral: Secondary | ICD-10-CM | POA: Diagnosis not present

## 2022-10-05 DIAGNOSIS — H524 Presbyopia: Secondary | ICD-10-CM | POA: Diagnosis not present

## 2022-10-05 DIAGNOSIS — Z961 Presence of intraocular lens: Secondary | ICD-10-CM | POA: Diagnosis not present

## 2022-10-05 DIAGNOSIS — H26493 Other secondary cataract, bilateral: Secondary | ICD-10-CM | POA: Diagnosis not present

## 2022-10-05 DIAGNOSIS — H1131 Conjunctival hemorrhage, right eye: Secondary | ICD-10-CM | POA: Diagnosis not present

## 2022-10-11 DIAGNOSIS — I10 Essential (primary) hypertension: Secondary | ICD-10-CM | POA: Diagnosis not present

## 2022-11-04 DIAGNOSIS — K644 Residual hemorrhoidal skin tags: Secondary | ICD-10-CM | POA: Diagnosis not present

## 2022-11-13 DIAGNOSIS — G4733 Obstructive sleep apnea (adult) (pediatric): Secondary | ICD-10-CM | POA: Diagnosis not present

## 2023-02-14 DIAGNOSIS — G4733 Obstructive sleep apnea (adult) (pediatric): Secondary | ICD-10-CM | POA: Diagnosis not present

## 2023-04-05 DIAGNOSIS — G4733 Obstructive sleep apnea (adult) (pediatric): Secondary | ICD-10-CM | POA: Diagnosis not present

## 2023-04-18 ENCOUNTER — Encounter: Payer: Self-pay | Admitting: Family Medicine

## 2023-04-19 ENCOUNTER — Encounter: Payer: Self-pay | Admitting: Family Medicine

## 2023-04-19 ENCOUNTER — Ambulatory Visit (INDEPENDENT_AMBULATORY_CARE_PROVIDER_SITE_OTHER): Payer: Medicare HMO | Admitting: Family Medicine

## 2023-04-19 VITALS — BP 132/76 | HR 89 | Temp 97.8°F | Ht 72.0 in | Wt 260.0 lb

## 2023-04-19 DIAGNOSIS — I10 Essential (primary) hypertension: Secondary | ICD-10-CM | POA: Insufficient documentation

## 2023-04-19 DIAGNOSIS — R7303 Prediabetes: Secondary | ICD-10-CM | POA: Insufficient documentation

## 2023-04-19 DIAGNOSIS — Z6835 Body mass index (BMI) 35.0-35.9, adult: Secondary | ICD-10-CM | POA: Insufficient documentation

## 2023-04-19 DIAGNOSIS — K219 Gastro-esophageal reflux disease without esophagitis: Secondary | ICD-10-CM | POA: Insufficient documentation

## 2023-04-19 DIAGNOSIS — E782 Mixed hyperlipidemia: Secondary | ICD-10-CM | POA: Diagnosis not present

## 2023-04-19 DIAGNOSIS — N1831 Chronic kidney disease, stage 3a: Secondary | ICD-10-CM | POA: Insufficient documentation

## 2023-04-19 DIAGNOSIS — N183 Chronic kidney disease, stage 3 unspecified: Secondary | ICD-10-CM | POA: Insufficient documentation

## 2023-04-19 NOTE — Assessment & Plan Note (Signed)
Blood pressure is in good control. Continue losartan 50 mg daily. 

## 2023-04-19 NOTE — Progress Notes (Signed)
[facility] [address] [place] [address] Dept: [phone] Dept Fax: [phone]  New Patient Office Visit  Subjective:    Patient ID: Cody [name], male    DOB: March 31, 1946, 77 y.o..   MRN: [id number]  Chief Complaint  Patient presents with   Establish Care    NP-establish care. Not fasting today.    History of Present Illness:  Patient is in today to establish care. Cody [name] was born in [place], [place]. His family moved to [place] when he was 77 years old. He attended [employer] for 2 years, but did not complete a degree. He worked in Technical [employer], retiring as a Biochemist, clinical at the [employer]. Cody [name] has been married for 44 years. He has two sons (42, 45). He denies use of tobacco or drugs. He has one alcohol drink daily.  Cody [name] has a history of hyperlipidemia. He is managed on atorvastatin 20 mg daily.  Cody [name] has a history of hypertension. he is managed on losartan 50 mg daily.  Cody [name] has a history of seasonal allergic rhinitis. He uses loratadine, azelastine nasal spray,ad Flonase as needed.  Cody [name] has a history of GERD. He manages this with omeprazole 20 mg daily.  Past Medical History: Patient Active Problem List   Diagnosis Date Noted   Essential hypertension 04/19/2023   GERD (gastroesophageal reflux disease) 04/19/2023   Class 2 obesity due to excess calories with body mass index (BMI) of 35.0 to 35.9 in adult 04/19/2023   Obstructive sleep apnea on CPAP 06/29/2017   Hyperlipidemia 06/20/2015   Seasonal allergic rhinitis 01/21/2009   Past Surgical History:  Procedure Laterality Date   CATARACT EXTRACTION W/ INTRAOCULAR LENS IMPLANT Bilateral 10/25/2014   cataracts removed   INTERPHALANGEAL JOINT ARTHROPLASTY Left    Great toe   TOTAL HIP ARTHROPLASTY Left 09/04/2015   Procedure: LEFT TOTAL HIP ARTHROPLASTY ANTERIOR APPROACH;  Surgeon: Samson Frederic, MD;   Location: [facility];  Service: Orthopedics;  Laterality: Left;   TOTAL HIP ARTHROPLASTY Right 03/25/2016   Procedure: RIGHT TOTAL HIP ARTHROPLASTY ANTERIOR APPROACH;  Surgeon: Samson Frederic, MD;  Location: [facility];  Service: Orthopedics;  Laterality: Right;   Family History  Problem Relation Age of Onset   Heart failure Mother    Heart Problems Mother        pacer   Alcohol abuse Mother    Arthritis Mother    Stroke Father    Hearing loss Father    Fragile X syndrome Father    Hypertension Sister    Other Sister        healthty   Other Son        healthy   Other Son        healthy   Outpatient Medications Prior to Visit  Medication Sig Dispense Refill   acetaminophen (TYLENOL) 325 MG tablet Take 650 mg by mouth every 6 (six) hours as needed.     atorvastatin (LIPITOR) 20 MG tablet Take 20 mg by mouth daily after supper.      azelastine (ASTELIN) 0.1 % nasal spray Place into both nostrils 2 (two) times daily. Use in each nostril as directed     fluticasone (FLONASE) 50 MCG/ACT nasal spray Place 2 sprays into both nostrils daily as needed for allergies.      guaiFENesin (MUCINEX) 600 MG 12 hr tablet Take by mouth 2 (two) times daily.     ibuprofen (ADVIL,MOTRIN) 200 MG  tablet Take 200 mg by mouth every 6 (six) hours as needed.     loratadine (CLARITIN) 10 MG tablet Take 10 mg by mouth daily.     losartan (COZAAR) 50 MG tablet Take 50 mg by mouth daily.     omeprazole (PRILOSEC) 20 MG capsule Take 20 mg by mouth daily.     Phenylephrine-Acetaminophen (TYLENOL SINUS CONGESTION/PAIN PO) Take by mouth as needed.     polyethylene glycol (MIRALAX / GLYCOLAX) 17 g packet Take 17 g by mouth daily.     Wheat Dextrin (BENEFIBER DRINK MIX PO) Take by mouth.     polyethylene glycol-electrolytes (NULYTELY) 420 g solution Take by mouth.     TALICIA 250-12.5-10 MG CPDR Take by mouth.     No facility-administered medications prior to visit.   Allergies  Allergen Reactions   Codeine Other (See  Comments)    "Can't take in large doses"   Objective:   Today's Vitals   04/19/23 0952  BP: 132/76  Pulse: 89  Temp: 97.8 F (36.6 C)  TempSrc: Temporal  SpO2: 98%  Weight: 260 lb (117.9 kg)  Height: 6' (1.829 m)   Body mass index is 35.26 kg/m.   General: Well developed, well nourished. No acute distress. Psych: Alert and oriented. Normal mood and affect.  Health Maintenance Due  Topic Date Due   Hepatitis C Screening  Never done   Medicare Annual Wellness (AWV)  06/24/2022     Assessment & Plan:   Problem List Items Addressed This Visit       Cardiovascular and Mediastinum   Essential hypertension - Primary    Blood pressure is in good control. Continue losartan 50 mg daily.      Relevant Medications   losartan (COZAAR) 50 MG tablet     Digestive   GERD (gastroesophageal reflux disease)    Stable. Continue omeprazole 20 mg daily.      Relevant Medications   Wheat Dextrin (BENEFIBER DRINK MIX PO)   polyethylene glycol (MIRALAX / GLYCOLAX) 17 g packet     Other   Hyperlipidemia    Stable. Continue atorvastatin 20 mg daily. We will plan fasting lipids at his next visit.      Relevant Medications   losartan (COZAAR) 50 MG tablet    Return in about 2 months (around 06/19/2023) for Annual preventative care.   Loyola Mast, MD

## 2023-04-19 NOTE — Assessment & Plan Note (Signed)
Stable.  Continue omeprazole 20mg daily

## 2023-04-19 NOTE — Assessment & Plan Note (Signed)
Stable. Continue atorvastatin 20 mg daily. We will plan fasting lipids at his next visit.

## 2023-05-18 DIAGNOSIS — G4733 Obstructive sleep apnea (adult) (pediatric): Secondary | ICD-10-CM | POA: Diagnosis not present

## 2023-05-30 ENCOUNTER — Ambulatory Visit (INDEPENDENT_AMBULATORY_CARE_PROVIDER_SITE_OTHER): Payer: Medicare HMO

## 2023-05-30 DIAGNOSIS — Z0000 Encounter for general adult medical examination without abnormal findings: Secondary | ICD-10-CM

## 2023-05-30 NOTE — Progress Notes (Signed)
Subjective:   Cody [name] is a 77 y.o. male who presents for Medicare Annual/Subsequent preventive examination.  Visit Complete: Virtual  I connected with  Cody [name] on 05/30/23 by a audio enabled telemedicine application and verified that I am speaking with the correct person using two identifiers.  Patient Location: Home  Provider Location: Office/Clinic  I discussed the limitations of evaluation and management by telemedicine. The patient expressed understanding and agreed to proceed.  Patient Medicare AWV questionnaire was completed by the patient on 05/25/2023; I have confirmed that all information answered by patient is correct and no changes since this date.  Vital Signs: Patient was unable to self-report vital signs via telehealth due to a lack of equipment at home.  Review of Systems     Cardiac Risk Factors include: advanced age (>43men, >67 women);dyslipidemia;hypertension;male gender     Objective:    Today's Vitals   05/30/23 1526  PainSc: 8    There is no height or weight on file to calculate BMI.     05/30/2023    3:31 PM 03/03/2017    1:02 PM 03/03/2017   12:57 PM 02/18/2017   10:42 AM 03/25/2016    3:45 PM 03/18/2016    2:39 PM 09/04/2015    5:45 PM  Advanced Directives  Does Patient Have a Medical Advance Directive? Yes  Yes Yes Yes Yes Yes  Type of Estate agent of [place];Living will  Living will Healthcare Power of [place];Living will Healthcare Power of [place];Living will Healthcare Power of [place];Living will Healthcare Power of [place];Living will  Does patient want to make changes to medical advance directive?   No - Patient declined  No - Patient declined No - Patient declined No - Patient declined  Copy of Healthcare Power of Attorney in Chart? No - copy requested Yes  Yes No - copy requested No - copy requested Yes    Current Medications (verified) Outpatient Encounter Medications as of 05/30/2023   Medication Sig   acetaminophen (TYLENOL) 325 MG tablet Take 650 mg by mouth every 6 (six) hours as needed.   atorvastatin (LIPITOR) 20 MG tablet Take 20 mg by mouth daily after supper.    azelastine (ASTELIN) 0.1 % nasal spray Place into both nostrils 2 (two) times daily. Use in each nostril as directed   fluticasone (FLONASE) 50 MCG/ACT nasal spray Place 2 sprays into both nostrils daily as needed for allergies.    guaiFENesin (MUCINEX) 600 MG 12 hr tablet Take by mouth 2 (two) times daily.   ibuprofen (ADVIL,MOTRIN) 200 MG tablet Take 200 mg by mouth every 6 (six) hours as needed.   loratadine (CLARITIN) 10 MG tablet Take 10 mg by mouth daily.   losartan (COZAAR) 50 MG tablet Take 50 mg by mouth daily.   omeprazole (PRILOSEC) 20 MG capsule Take 20 mg by mouth daily.   Phenylephrine-Acetaminophen (TYLENOL SINUS CONGESTION/PAIN PO) Take by mouth as needed.   polyethylene glycol (MIRALAX / GLYCOLAX) 17 g packet Take 17 g by mouth daily.   Wheat Dextrin (BENEFIBER DRINK MIX PO) Take by mouth.   No facility-administered encounter medications on file as of 05/30/2023.    Allergies (verified) Codeine   History: Past Medical History:  Diagnosis Date   Allergy 1960   Arthritis    Basal cell carcinoma 09/12/2017   RIGHT SIDEBURN TX CX3 5FU CAUTERY   Cancer (HCC)    Cataract 2016   Constipation    Environmental allergies  GERD (gastroesophageal reflux disease)    Headache    "bad sinus headache"   History of shingles    History of skin cancer    Hyperlipidemia    Hypertension 05-25-2022   Nocturia    SCC (squamous cell carcinoma) 01/20/2009   LEFT INNER EAR CX3 5FU   SCC (squamous cell carcinoma) 05/05/2010   LEFT PARASPINAL   SCC (squamous cell carcinoma) 09/12/2017   RIGHT NECK TX CX3 5FU   SCC (squamous cell carcinoma) 09/12/2017   RIGHT UPPER BACK TX CX3 5FU CAUTERY   SCC (squamous cell carcinoma) 10/30/2019   LOWER STERNUM TX CX3 5FU CAUTERY   Sleep apnea    Cpap set  10-16   Sleep apnea, obstructive    cpap   Squamous cell carcinoma of skin 01/20/2009   LEFT LOW PARASPINAL CX3 5FU   Past Surgical History:  Procedure Laterality Date   CATARACT EXTRACTION W/ INTRAOCULAR LENS IMPLANT Bilateral 10/25/2014   cataracts removed   INTERPHALANGEAL JOINT ARTHROPLASTY Left    Great toe   TOTAL HIP ARTHROPLASTY Left 09/04/2015   Procedure: LEFT TOTAL HIP ARTHROPLASTY ANTERIOR APPROACH;  Surgeon: Samson Frederic, MD;  Location: [facility];  Service: Orthopedics;  Laterality: Left;   TOTAL HIP ARTHROPLASTY Right 03/25/2016   Procedure: RIGHT TOTAL HIP ARTHROPLASTY ANTERIOR APPROACH;  Surgeon: Samson Frederic, MD;  Location: [facility];  Service: Orthopedics;  Laterality: Right;   Family History  Problem Relation Age of Onset   Heart failure Mother    Heart Problems Mother        pacer   Alcohol abuse Mother    Arthritis Mother    Stroke Father    Hearing loss Father    Fragile X syndrome Father    Hypertension Sister    Other Sister        healthty   Other Son        healthy   Other Son        healthy   Social History   Socioeconomic History   Marital status: Married    Spouse name: Not on file   Number of children: 2   Years of education: Not on file   Highest education level: Some college, no degree  Occupational History   Occupation: Retired  Tobacco Use   Smoking status: Former    Current packs/day: 0.00    Average packs/day: 1.5 packs/day for 15.0 years (22.5 ttl pk-yrs)    Types: Cigarettes    Start date: 08/25/1967    Quit date: 08/24/1982    Years since quitting: 40.7   Smokeless tobacco: Never  Vaping Use   Vaping status: Never Used  Substance and Sexual Activity   Alcohol use: Yes    Alcohol/week: 5.0 standard drinks of alcohol    Types: 5 Standard drinks or equivalent per week    Comment: 1 drink daily   Drug use: No   Sexual activity: Yes  Other Topics Concern   Not on file  Social History Narrative   Not on file   Social  Determinants of Health   Financial Resource Strain: Low Risk  (05/25/2023)   Overall Financial Resource Strain (CARDIA)    Difficulty of Paying Living Expenses: Not very hard  Food Insecurity: No Food Insecurity (05/25/2023)   Hunger Vital Sign    Worried About Running Out of Food in the Last Year: Never true    Ran Out of Food in the Last Year: Never true  Transportation Needs: No Transportation Needs (  05/25/2023)   PRAPARE - Administrator, Civil Service (Medical): No    Lack of Transportation (Non-Medical): No  Physical Activity: Insufficiently Active (05/25/2023)   Exercise Vital Sign    Days of Exercise per Week: 6 days    Minutes of Exercise per Session: 10 min  Stress: No Stress Concern Present (05/25/2023)   [employer] of Occupational Health - Occupational Stress Questionnaire    Feeling of Stress : Only a little  Social Connections: Unknown (05/25/2023)   Social Connection and Isolation Panel [NHANES]    Frequency of Communication with Friends and Family: More than three times a week    Frequency of Social Gatherings with Friends and Family: Twice a week    Attends Religious Services: Not on Marketing executive or Organizations: No    Attends Engineer, structural: Not on file    Marital Status: Married    Tobacco Counseling Counseling given: Not Answered   Clinical Intake:  Pre-visit preparation completed: Yes  Pain : 0-10 Pain Score: 8  Pain Type: Chronic pain Pain Location: Head Pain Descriptors / Indicators: Aching Pain Onset: More than a month ago Pain Frequency: Intermittent     Nutritional Risks: None Diabetes: No  How often do you need to have someone help you when you read instructions, pamphlets, or other written materials from your doctor or pharmacy?: 1 - Never  Interpreter Needed?: No  Information entered by :: NAllen LPN   Activities of Daily Living    05/25/2023    8:32 AM  In your present state of  health, do you have any difficulty performing the following activities:  Hearing? 0  Vision? 0  Difficulty concentrating or making decisions? 0  Walking or climbing stairs? 0  Dressing or bathing? 0  Doing errands, shopping? 0  Preparing Food and eating ? N  Using the Toilet? N  In the past six months, have you accidently leaked urine? N  Do you have problems with loss of bowel control? N  Managing your Medications? N  Managing your Finances? N  Housekeeping or managing your Housekeeping? N    Patient Care Team: Loyola Mast, MD as PCP - General (Family Medicine) Nita Sells, MD (Dermatology) Mateo Flow, MD as Consulting Physician (Ophthalmology) Samson Frederic, MD as Consulting Physician (Orthopedic Surgery) Toni Arthurs, MD as Consulting Physician (Orthopedic Surgery) Willis Modena, MD as Consulting Physician (Gastroenterology) Christia Reading, MD as Consulting Physician (Otolaryngology) Waymon Budge, MD as Consulting Physician (Pulmonary Disease)  Indicate any recent Medical Services you may have received from other than [facility] providers in the past year (date may be approximate).     Assessment:   This is a routine wellness examination for Liem.  Hearing/Vision screen Hearing Screening - Comments:: Denies hearing issues Vision Screening - Comments:: Regular eye exams,   Dietary issues and exercise activities discussed:     Goals Addressed             This Visit's Progress    Patient Stated       05/30/2023, stay alive       Depression Screen    05/30/2023    3:32 PM 04/19/2023   10:10 AM  PHQ 2/9 Scores  PHQ - 2 Score 0 0  PHQ- 9 Score 2 3    Fall Risk    05/25/2023    8:32 AM 04/19/2023   10:10 AM  Fall Risk   Falls in the past  year? 0 1  Number falls in past yr: 0 0  Injury with Fall? 0 0  Risk for fall due to : Medication side effect No Fall Risks  Follow up Falls prevention discussed;Falls evaluation completed Falls evaluation  completed    MEDICARE RISK AT HOME:  Medicare Risk at Home - 05/30/23 1532     Any stairs in or around the home? No    If so, are there any without handrails? No    Home free of loose throw rugs in walkways, pet beds, electrical cords, etc? Yes    Adequate lighting in your home to reduce risk of falls? Yes    Life alert? No    Use of a cane, walker or w/c? No    Grab bars in the bathroom? No    Shower chair or bench in shower? Yes    Elevated toilet seat or a handicapped toilet? Yes             TIMED UP AND GO:  Was the test performed?  No    Cognitive Function:        05/30/2023    3:33 PM  6CIT Screen  What Year? 0 points  What month? 0 points  What time? 0 points  Count back from 20 0 points  Months in reverse 0 points  Repeat phrase 0 points  Total Score 0 points    Immunizations Immunization History  Administered Date(s) Administered   PFIZER(Purple Top)SARS-COV-2 Vaccination 11/30/2019, 12/26/2019   Pneumococcal Conjugate-13 05/22/2014   Pneumococcal Polysaccharide-23 04/19/2013   Tdap 05/28/2015   Zoster Recombinant(Shingrix) 03/29/2011    TDAP status: Up to date  Flu Vaccine status: Declined, Education has been provided regarding the importance of this vaccine but patient still declined. Advised may receive this vaccine at local pharmacy or Health Dept. Aware to provide a copy of the vaccination record if obtained from local pharmacy or Health Dept. Verbalized acceptance and understanding.  Pneumococcal vaccine status: Up to date  Covid-19 vaccine status: Information provided on how to obtain vaccines.   Qualifies for Shingles Vaccine? Yes   Zostavax completed Yes   Shingrix Completed?: No.    Education has been provided regarding the importance of this vaccine. Patient has been advised to call insurance company to determine out of pocket expense if they have not yet received this vaccine. Advised may also receive vaccine at local pharmacy or Health  Dept. Verbalized acceptance and understanding.  Screening Tests Health Maintenance  Topic Date Due   Hepatitis C Screening  Never done   COVID-19 Vaccine (3 - Pfizer risk series) 01/23/2020   Zoster Vaccines- Shingrix (2 of 2) 07/20/2023 (Originally 05/24/2011)   INFLUENZA VACCINE  01/23/2024 (Originally 05/26/2023)   Medicare Annual Wellness (AWV)  05/29/2024   DTaP/Tdap/Td (2 - Td or Tdap) 05/27/2025   Colonoscopy  09/10/2030   Pneumonia Vaccine 22+ Years old  Completed   HPV VACCINES  Aged Out    Health Maintenance  Health Maintenance Due  Topic Date Due   Hepatitis C Screening  Never done   COVID-19 Vaccine (3 - Pfizer risk series) 01/23/2020    Colorectal cancer screening: Type of screening: Colonoscopy. Completed 09/10/2020. Repeat every 10 years  Lung Cancer Screening: (Low Dose CT Chest recommended if Age 60-80 years, 20 pack-year currently smoking OR have quit w/in 15years.) does not qualify.   Lung Cancer Screening Referral: no  Additional Screening:  Hepatitis C Screening: does qualify;  Vision Screening: Recommended annual ophthalmology exams  for early detection of glaucoma and other disorders of the eye. Is the patient up to date with their annual eye exam?  Yes  Who is the provider or what is the name of the office in which the patient attends annual eye exams? Can't remember name If pt is not established with a provider, would they like to be referred to a provider to establish care? No .   Dental Screening: Recommended annual dental exams for proper oral hygiene  Diabetic Foot Exam: n/a  Community Resource Referral / Chronic Care Management: CRR required this visit?  No   CCM required this visit?  No     Plan:     I have personally reviewed and noted the following in the patient's chart:   Medical and social history Use of alcohol, tobacco or illicit drugs  Current medications and supplements including opioid prescriptions. Patient is not  currently taking opioid prescriptions. Functional ability and status Nutritional status Physical activity Advanced directives List of other physicians Hospitalizations, surgeries, and ER visits in previous 12 months Vitals Screenings to include cognitive, depression, and falls Referrals and appointments  In addition, I have reviewed and discussed with patient certain preventive protocols, quality metrics, and best practice recommendations. A written personalized care plan for preventive services as well as general preventive health recommendations were provided to patient.     Barb Merino, LPN   10/29/7827   After Visit Summary: (MyChart) Due to this being a telephonic visit, the after visit summary with patients personalized plan was offered to patient via MyChart   Nurse Notes: none

## 2023-05-30 NOTE — Patient Instructions (Signed)
[name] , Thank you for taking time to come for your Medicare Wellness Visit. I appreciate your ongoing commitment to your health goals. Please review the following plan we discussed and let me know if I can assist you in the future.   Referrals/Orders/Follow-Ups/Clinician Recommendations: none  This is a list of the screening recommended for you and due dates:  Health Maintenance  Topic Date Due   Hepatitis C Screening  Never done   COVID-19 Vaccine (3 - Pfizer risk series) 01/23/2020   Zoster (Shingles) Vaccine (2 of 2) 07/20/2023*   Flu Shot  01/23/2024*   Medicare Annual Wellness Visit  05/29/2024   DTaP/Tdap/Td vaccine (2 - Td or Tdap) 05/27/2025   Colon Cancer Screening  09/10/2030   Pneumonia Vaccine  Completed   HPV Vaccine  Aged Out  *Topic was postponed. The date shown is not the original due date.    Advanced directives: (Copy Requested) Please bring a copy of your health care power of attorney and living will to the office to be added to your chart at your convenience.  Next Medicare Annual Wellness Visit scheduled for next year: Yes  Preventive Care 75 Years and Older, Male  Preventive care refers to lifestyle choices and visits with your health care provider that can promote health and wellness. What does preventive care include? A yearly physical exam. This is also called an annual well check. Dental exams once or twice a year. Routine eye exams. Ask your health care provider how often you should have your eyes checked. Personal lifestyle choices, including: Daily care of your teeth and gums. Regular physical activity. Eating a healthy diet. Avoiding tobacco and drug use. Limiting alcohol use. Practicing safe sex. Taking low doses of aspirin every day. Taking vitamin and mineral supplements as recommended by your health care provider. What happens during an annual well check? The services and screenings done by your health care provider during your annual well  check will depend on your age, overall health, lifestyle risk factors, and family history of disease. Counseling  Your health care provider may ask you questions about your: Alcohol use. Tobacco use. Drug use. Emotional well-being. Home and relationship well-being. Sexual activity. Eating habits. History of falls. Memory and ability to understand (cognition). Work and work Astronomer. Screening  You may have the following tests or measurements: Height, weight, and BMI. Blood pressure. Lipid and cholesterol levels. These may be checked every 5 years, or more frequently if you are over 27 years old. Skin check. Lung cancer screening. You may have this screening every year starting at age 52 if you have a 30-pack-year history of smoking and currently smoke or have quit within the past 15 years. Fecal occult blood test (FOBT) of the stool. You may have this test every year starting at age 24. Flexible sigmoidoscopy or colonoscopy. You may have a sigmoidoscopy every 5 years or a colonoscopy every 10 years starting at age 79. Prostate cancer screening. Recommendations will vary depending on your family history and other risks. Hepatitis C blood test. Hepatitis B blood test. Sexually transmitted disease (STD) testing. Diabetes screening. This is done by checking your blood sugar (glucose) after you have not eaten for a while (fasting). You may have this done every 1-3 years. Abdominal aortic aneurysm (AAA) screening. You may need this if you are a current or former smoker. Osteoporosis. You may be screened starting at age 29 if you are at high risk. Talk with your health care provider about your  test results, treatment options, and if necessary, the need for more tests. Vaccines  Your health care provider may recommend certain vaccines, such as: Influenza vaccine. This is recommended every year. Tetanus, diphtheria, and acellular pertussis (Tdap, Td) vaccine. You may need a Td booster  every 10 years. Zoster vaccine. You may need this after age 75. Pneumococcal 13-valent conjugate (PCV13) vaccine. One dose is recommended after age 75. Pneumococcal polysaccharide (PPSV23) vaccine. One dose is recommended after age 75. Talk to your health care provider about which screenings and vaccines you need and how often you need them. This information is not intended to replace advice given to you by your health care provider. Make sure you discuss any questions you have with your health care provider. Document Released: 11/07/2015 Document Revised: 06/30/2016 Document Reviewed: 08/12/2015 Elsevier Interactive Patient Education  2017 [employer].  Fall Prevention in the Home Falls can cause injuries. They can happen to people of all ages. There are many things you can do to make your home safe and to help prevent falls. What can I do on the outside of my home? Regularly fix the edges of walkways and driveways and fix any cracks. Remove anything that might make you trip as you walk through a door, such as a raised step or threshold. Trim any bushes or trees on the path to your home. Use bright outdoor lighting. Clear any walking paths of anything that might make someone trip, such as rocks or tools. Regularly check to see if handrails are loose or broken. Make sure that both sides of any steps have handrails. Any raised decks and porches should have guardrails on the edges. Have any leaves, snow, or ice cleared regularly. Use sand or salt on walking paths during winter. Clean up any spills in your garage right away. This includes oil or grease spills. What can I do in the bathroom? Use night lights. Install grab bars by the toilet and in the tub and shower. Do not use towel bars as grab bars. Use non-skid mats or decals in the tub or shower. If you need to sit down in the shower, use a plastic, non-slip stool. Keep the floor dry. Clean up any water that spills on the floor as soon  as it happens. Remove soap buildup in the tub or shower regularly. Attach bath mats securely with double-sided non-slip rug tape. Do not have throw rugs and other things on the floor that can make you trip. What can I do in the bedroom? Use night lights. Make sure that you have a light by your bed that is easy to reach. Do not use any sheets or blankets that are too big for your bed. They should not hang down onto the floor. Have a firm chair that has side arms. You can use this for support while you get dressed. Do not have throw rugs and other things on the floor that can make you trip. What can I do in the kitchen? Clean up any spills right away. Avoid walking on wet floors. Keep items that you use a lot in easy-to-reach places. If you need to reach something above you, use a strong step stool that has a grab bar. Keep electrical cords out of the way. Do not use floor polish or wax that makes floors slippery. If you must use wax, use non-skid floor wax. Do not have throw rugs and other things on the floor that can make you trip. What can I do with my  stairs? Do not leave any items on the stairs. Make sure that there are handrails on both sides of the stairs and use them. Fix handrails that are broken or loose. Make sure that handrails are as long as the stairways. Check any carpeting to make sure that it is firmly attached to the stairs. Fix any carpet that is loose or worn. Avoid having throw rugs at the top or bottom of the stairs. If you do have throw rugs, attach them to the floor with carpet tape. Make sure that you have a light switch at the top of the stairs and the bottom of the stairs. If you do not have them, ask someone to add them for you. What else can I do to help prevent falls? Wear shoes that: Do not have high heels. Have rubber bottoms. Are comfortable and fit you well. Are closed at the toe. Do not wear sandals. If you use a stepladder: Make sure that it is fully  opened. Do not climb a closed stepladder. Make sure that both sides of the stepladder are locked into place. Ask someone to hold it for you, if possible. Clearly mark and make sure that you can see: Any grab bars or handrails. First and last steps. Where the edge of each step is. Use tools that help you move around (mobility aids) if they are needed. These include: Canes. Walkers. Scooters. Crutches. Turn on the lights when you go into a dark area. Replace any light bulbs as soon as they burn out. Set up your furniture so you have a clear path. Avoid moving your furniture around. If any of your floors are uneven, fix them. If there are any pets around you, be aware of where they are. Review your medicines with your doctor. Some medicines can make you feel dizzy. This can increase your chance of falling. Ask your doctor what other things that you can do to help prevent falls. This information is not intended to replace advice given to you by your health care provider. Make sure you discuss any questions you have with your health care provider. Document Released: 08/07/2009 Document Revised: 03/18/2016 Document Reviewed: 11/15/2014 Elsevier Interactive Patient Education  2017 [employer].

## 2023-06-09 ENCOUNTER — Encounter (INDEPENDENT_AMBULATORY_CARE_PROVIDER_SITE_OTHER): Payer: Self-pay

## 2023-07-05 ENCOUNTER — Encounter: Payer: Self-pay | Admitting: Family Medicine

## 2023-07-05 ENCOUNTER — Ambulatory Visit (INDEPENDENT_AMBULATORY_CARE_PROVIDER_SITE_OTHER): Payer: Medicare HMO | Admitting: Family Medicine

## 2023-07-05 VITALS — BP 134/82 | HR 70 | Temp 97.6°F | Ht 72.0 in | Wt 261.2 lb

## 2023-07-05 DIAGNOSIS — I10 Essential (primary) hypertension: Secondary | ICD-10-CM | POA: Diagnosis not present

## 2023-07-05 DIAGNOSIS — H905 Unspecified sensorineural hearing loss: Secondary | ICD-10-CM | POA: Insufficient documentation

## 2023-07-05 DIAGNOSIS — N183 Chronic kidney disease, stage 3 unspecified: Secondary | ICD-10-CM | POA: Diagnosis not present

## 2023-07-05 DIAGNOSIS — Z0000 Encounter for general adult medical examination without abnormal findings: Secondary | ICD-10-CM | POA: Diagnosis not present

## 2023-07-05 DIAGNOSIS — E782 Mixed hyperlipidemia: Secondary | ICD-10-CM | POA: Diagnosis not present

## 2023-07-05 DIAGNOSIS — M159 Polyosteoarthritis, unspecified: Secondary | ICD-10-CM | POA: Insufficient documentation

## 2023-07-05 DIAGNOSIS — R7303 Prediabetes: Secondary | ICD-10-CM

## 2023-07-05 DIAGNOSIS — L570 Actinic keratosis: Secondary | ICD-10-CM | POA: Insufficient documentation

## 2023-07-05 DIAGNOSIS — Z1159 Encounter for screening for other viral diseases: Secondary | ICD-10-CM

## 2023-07-05 DIAGNOSIS — Z125 Encounter for screening for malignant neoplasm of prostate: Secondary | ICD-10-CM

## 2023-07-05 DIAGNOSIS — M6208 Separation of muscle (nontraumatic), other site: Secondary | ICD-10-CM | POA: Insufficient documentation

## 2023-07-05 LAB — LIPID PANEL
Cholesterol: 132 mg/dL (ref 0–200)
HDL: 31.4 mg/dL — ABNORMAL LOW (ref >39.00)
LDL Cholesterol: 73 mg/dL (ref 0–99)
NonHDL: 100.82
Total CHOL/HDL Ratio: 4
Triglycerides: 141 mg/dL (ref 0.0–149.0)
VLDL: 28.2 mg/dL (ref 0.0–40.0)

## 2023-07-05 LAB — BASIC METABOLIC PANEL
BUN: 13 mg/dL (ref 6–23)
CO2: 27 meq/L (ref 19–32)
Calcium: 9.4 mg/dL (ref 8.4–10.5)
Chloride: 104 meq/L (ref 96–112)
Creatinine, Ser: 1.17 mg/dL (ref 0.40–1.50)
GFR: 61.06 mL/min (ref >60.00)
Glucose, Bld: 110 mg/dL — ABNORMAL HIGH (ref 70–99)
Potassium: 4.4 meq/L (ref 3.5–5.1)
Sodium: 139 meq/L (ref 135–145)

## 2023-07-05 LAB — CBC
HCT: 44.1 % (ref 39.0–52.0)
Hemoglobin: 15 g/dL (ref 13.0–17.0)
MCHC: 34 g/dL (ref 30.0–36.0)
MCV: 85.6 fl (ref 78.0–100.0)
Platelets: 241 10*3/uL (ref 150.0–400.0)
RBC: 5.15 Mil/uL (ref 4.22–5.81)
RDW: 13.7 % (ref 11.5–15.5)
WBC: 6.4 10*3/uL (ref 4.0–10.5)

## 2023-07-05 LAB — HEMOGLOBIN A1C: Hgb A1c MFr Bld: 5.5 % (ref 4.6–6.5)

## 2023-07-05 LAB — PSA, MEDICARE: PSA: 2.17 ng/mL (ref 0.10–4.00)

## 2023-07-05 LAB — PHOSPHORUS: Phosphorus: 2.9 mg/dL (ref 2.3–4.6)

## 2023-07-05 NOTE — Assessment & Plan Note (Signed)
I will check a glucose and A1c for monitoring today.

## 2023-07-05 NOTE — Assessment & Plan Note (Signed)
I will check annual lipids today. Continue atorvastatin 20 mg daily.

## 2023-07-05 NOTE — Assessment & Plan Note (Signed)
Recommend he continue to follow with dermatology for cryotherapy of these lesions.

## 2023-07-05 NOTE — Progress Notes (Signed)
[facility] PRIMARY CARE [facility] PRIMARY Trecia Rogers [facility] [address] [place] [address] Dept: [phone] Dept Fax: [phone]  Annual Physical Visit  Subjective:    Patient ID: Cody [name], male    DOB: 1946/10/17, 77 y.o..   MRN: [id number]  Chief Complaint  Patient presents with   Annual Exam    CPE/labs.  No concerns.   Fasting today.    History of Present Illness:  Patient is in today for an annual physical/preventative visit.  Mr. Yep has a history of hyperlipidemia. He is managed on atorvastatin 20 mg daily.   Mr. Ponds has a history of hypertension. He is managed on losartan 50 mg daily.   Mr. Ruby has a history of seasonal allergic rhinitis. He uses loratadine, azelastine nasal spray, and Flonase as needed.   Mr. Scholler has a history of GERD. He manages this with omeprazole 20 mg daily.  Review of Systems  Constitutional:  Negative for chills, diaphoresis, fever, malaise/fatigue and weight loss.  HENT:  Positive for congestion and hearing loss. Negative for ear pain, sinus pain, sore throat and tinnitus.        History of NSHL. Uses hearing aides.  Eyes:  Negative for blurred vision, pain, discharge and redness.  Respiratory:  Negative for cough, shortness of breath and wheezing.   Cardiovascular:  Negative for chest pain and palpitations.  Gastrointestinal:  Positive for heartburn. Negative for abdominal pain, constipation, diarrhea, nausea and vomiting.       Occasional flare of GERD. On omeprazole.  Musculoskeletal:  Positive for joint pain. Negative for back pain and myalgias.       Recent flare of discomfort in right shoulder. Had been doing some overhead work, which he feels irritated the joint. Has arthritis of hips and knees as well, which limits his physical activity at times.  Skin:  Negative for itching and rash.       Follows with dermatology annually related to AK and prior SCC in situ.  Psychiatric/Behavioral:  Negative for  depression. The patient is not nervous/anxious.    Past Medical History: Patient Active Problem List   Diagnosis Date Noted   Essential hypertension 04/19/2023   GERD (gastroesophageal reflux disease) 04/19/2023   Class 2 obesity due to excess calories with body mass index (BMI) of 35.0 to 35.9 in adult 04/19/2023   CKD (chronic kidney disease) stage 3, GFR 30-59 ml/min (HCC) 04/19/2023   Prediabetes 04/19/2023   Obstructive sleep apnea on CPAP 06/29/2017   Hyperlipidemia 06/20/2015   Seasonal allergic rhinitis 01/21/2009   Past Surgical History:  Procedure Laterality Date   CATARACT EXTRACTION W/ INTRAOCULAR LENS IMPLANT Bilateral 10/25/2014   cataracts removed   EYE SURGERY  2016   Lense Implants   INTERPHALANGEAL JOINT ARTHROPLASTY Left    Great toe   JOINT REPLACEMENT  2016-2017   Both Hips   TOTAL HIP ARTHROPLASTY Left 09/04/2015   Procedure: LEFT TOTAL HIP ARTHROPLASTY ANTERIOR APPROACH;  Surgeon: Samson Frederic, MD;  Location: [facility];  Service: Orthopedics;  Laterality: Left;   TOTAL HIP ARTHROPLASTY Right 03/25/2016   Procedure: RIGHT TOTAL HIP ARTHROPLASTY ANTERIOR APPROACH;  Surgeon: Samson Frederic, MD;  Location: [facility];  Service: Orthopedics;  Laterality: Right;   Family History  Problem Relation Age of Onset   Heart failure Mother    Heart Problems Mother        pacer   Alcohol abuse Mother    Arthritis Mother    Drug abuse Mother  Stroke Father    Hearing loss Father    Fragile X syndrome Father    Hypertension Sister    Other Sister        healthty   Other Son        healthy   Other Son        healthy   Outpatient Medications Prior to Visit  Medication Sig Dispense Refill   atorvastatin (LIPITOR) 20 MG tablet Take 20 mg by mouth daily after supper.      azelastine (ASTELIN) 0.1 % nasal spray Place into both nostrils 2 (two) times daily. Use in each nostril as directed     fluticasone (FLONASE) 50 MCG/ACT nasal spray Place 2 sprays into both  nostrils daily as needed for allergies.      guaiFENesin (MUCINEX) 600 MG 12 hr tablet Take by mouth 2 (two) times daily.     ibuprofen (ADVIL,MOTRIN) 200 MG tablet Take 200 mg by mouth every 6 (six) hours as needed.     loratadine (CLARITIN) 10 MG tablet Take 10 mg by mouth daily.     losartan (COZAAR) 50 MG tablet Take 50 mg by mouth daily.     omeprazole (PRILOSEC) 20 MG capsule Take 20 mg by mouth daily.     Phenylephrine-Acetaminophen (TYLENOL SINUS CONGESTION/PAIN PO) Take by mouth as needed.     polyethylene glycol (MIRALAX / GLYCOLAX) 17 g packet Take 17 g by mouth daily.     Wheat Dextrin (BENEFIBER DRINK MIX PO) Take by mouth.     acetaminophen (TYLENOL) 325 MG tablet Take 650 mg by mouth every 6 (six) hours as needed.     No facility-administered medications prior to visit.   Allergies  Allergen Reactions   Codeine Other (See Comments)    "Can't take in large doses"   Objective:   Today's Vitals   07/05/23 0826  BP: 134/82  Pulse: 70  Temp: 97.6 F (36.4 C)  TempSrc: Temporal  SpO2: 98%  Weight: 261 lb 3.2 oz (118.5 kg)  Height: 6' (1.829 m)   Body mass index is 35.43 kg/m.   General: Well developed, well nourished. No acute distress. HEENT: Normocephalic, non-traumatic. PERRL, EOMI. Conjunctiva clear. External ears normal.   EAC and TMs normal bilaterally. Nose clear without congestion or rhinorrhea. Mucous membranes   moist. Oropharynx clear. Fair dentition. Neck: Supple. No lymphadenopathy. No thyromegaly. Lungs: Clear to auscultation bilaterally. No wheezing, rales or rhonchi. CV: RRR without murmurs or rubs. Pulses 2+ bilaterally. Abdomen: Soft, non-tender. Diastasis present. Bowel sounds positive, normal pitch and frequency.   No hepatosplenomegaly. No rebound or guarding. Extremities: Full ROM. Mild crepitance of right shoulder and both knees. No joint swelling or   tenderness. No edema noted. Skin: Warm and dry. There are scattered areas of scaly  macular lesions c/w AK Psych: Alert and oriented. Normal mood and affect.  Health Maintenance Due  Topic Date Due   Hepatitis C Screening  Never done     Assessment & Plan:   Problem List Items Addressed This Visit       Cardiovascular and Mediastinum   Essential hypertension    Blood pressure is in adequate control. Continue losartan 50 mg daily. I will check annual renal labs today.      Relevant Orders   Basic metabolic panel     Musculoskeletal and Integument   Actinic keratoses    Recommend he continue to follow with dermatology for cryotherapy of these lesions.  Genitourinary   CKD (chronic kidney disease) stage 3, GFR 30-59 ml/min (HCC)    I will check renal labs and screen for complications of CKD.      Relevant Orders   Basic metabolic panel   CBC   Phosphorus   Parathyroid hormone, intact (no Ca)     Other   Hyperlipidemia    I will check annual lipids today. Continue atorvastatin 20 mg daily.      Relevant Orders   Lipid panel   Prediabetes    I will check a glucose and A1c for monitoring today.      Relevant Orders   Basic metabolic panel   Hemoglobin A1c   Other Visit Diagnoses     Annual physical exam    -  Primary   Overall fair health. Encourage regular exercise. Discussed recommended screenings. Urged him to discuss Shingrix #2 with his pharmacist.   Screening for prostate cancer       Relevant Orders   PSA, Medicare   Encounter for hepatitis C screening test for low risk patient       Relevant Orders   HCV Ab w Reflex to Quant PCR       Return in about 3 months (around 10/04/2023) for Reassessment.   Loyola Mast, MD

## 2023-07-05 NOTE — Assessment & Plan Note (Signed)
Blood pressure is in adequate control. Continue losartan 50 mg daily. I will check annual renal labs today.

## 2023-07-05 NOTE — Assessment & Plan Note (Signed)
I will check renal labs and screen for complications of CKD.

## 2023-07-06 LAB — HCV INTERPRETATION

## 2023-07-06 LAB — PARATHYROID HORMONE, INTACT (NO CA): PTH: 47 pg/mL (ref 16–77)

## 2023-07-06 LAB — HCV AB W REFLEX TO QUANT PCR: HCV Ab: NONREACTIVE

## 2023-07-25 ENCOUNTER — Other Ambulatory Visit: Payer: Self-pay | Admitting: Family Medicine

## 2023-07-25 MED ORDER — LOSARTAN POTASSIUM 50 MG PO TABS: 50.0000 mg | ORAL_TABLET | Freq: Every day | ORAL | 3 refills | Status: DC

## 2023-07-25 MED ORDER — ATORVASTATIN CALCIUM 20 MG PO TABS: 20.0000 mg | ORAL_TABLET | Freq: Every day | ORAL | 3 refills | Status: DC

## 2023-07-25 MED ORDER — OMEPRAZOLE 20 MG PO CPDR: 20.0000 mg | DELAYED_RELEASE_CAPSULE | Freq: Every day | ORAL | 3 refills | Status: DC

## 2023-07-25 NOTE — Telephone Encounter (Signed)
Prescription Request  07/25/2023  LOV: 07/05/2023  What is the name of the medication or equipment? losartan (COZAAR) 50 MG tablet [[id number]], omeprazole (PRILOSEC) 20 MG capsule [[id number]] and atorvastatin (LIPITOR) 20 MG tablet [[id number]]    Have you contacted your pharmacy to request a refill? No, he is needing a 90 day supple in 10 days. He wants to make sure everything is fine for him to get these meds.   Which pharmacy would you like this sent to?  [id number] [place], [place] - [address] [place] [address] Phone: [phone] Fax: [phone]    Patient notified that their request is being sent to the clinical staff for review and that they should receive a response within 2 business days.   Please advise at [facility] [phone]

## 2023-07-25 NOTE — Telephone Encounter (Signed)
Patient notified VIA. Dm/cma  

## 2023-07-27 DIAGNOSIS — L570 Actinic keratosis: Secondary | ICD-10-CM | POA: Diagnosis not present

## 2023-07-27 DIAGNOSIS — Z1283 Encounter for screening for malignant neoplasm of skin: Secondary | ICD-10-CM | POA: Diagnosis not present

## 2023-07-27 DIAGNOSIS — X32XXXD Exposure to sunlight, subsequent encounter: Secondary | ICD-10-CM | POA: Diagnosis not present

## 2023-07-27 DIAGNOSIS — D225 Melanocytic nevi of trunk: Secondary | ICD-10-CM | POA: Diagnosis not present

## 2023-07-27 DIAGNOSIS — B078 Other viral warts: Secondary | ICD-10-CM | POA: Diagnosis not present

## 2023-08-24 DIAGNOSIS — G4733 Obstructive sleep apnea (adult) (pediatric): Secondary | ICD-10-CM | POA: Diagnosis not present

## 2023-09-30 ENCOUNTER — Ambulatory Visit
Admission: EM | Admit: 2023-09-30 | Discharge: 2023-09-30 | Disposition: A | Discharge status: Home / Self Care | Payer: Medicare HMO | Attending: Family Medicine | Admitting: Family Medicine

## 2023-09-30 ENCOUNTER — Ambulatory Visit (INDEPENDENT_AMBULATORY_CARE_PROVIDER_SITE_OTHER): Payer: Medicare HMO

## 2023-09-30 DIAGNOSIS — R0989 Other specified symptoms and signs involving the circulatory and respiratory systems: Secondary | ICD-10-CM | POA: Insufficient documentation

## 2023-09-30 DIAGNOSIS — J329 Chronic sinusitis, unspecified: Secondary | ICD-10-CM | POA: Insufficient documentation

## 2023-09-30 DIAGNOSIS — J40 Bronchitis, not specified as acute or chronic: Secondary | ICD-10-CM | POA: Diagnosis not present

## 2023-09-30 DIAGNOSIS — R058 Other specified cough: Secondary | ICD-10-CM | POA: Diagnosis not present

## 2023-09-30 MED ORDER — ALBUTEROL SULFATE HFA 108 (90 BASE) MCG/ACT IN AERS
1.0000 | INHALATION_SPRAY | Freq: Four times a day (QID) | RESPIRATORY_TRACT | 0 refills | Status: DC | PRN
Start: 2023-09-30 — End: 2023-11-16

## 2023-09-30 MED ORDER — PREDNISONE 10 MG PO TABS
30.0000 mg | ORAL_TABLET | Freq: Every day | ORAL | 0 refills | Status: DC
Start: 2023-09-30 — End: 2023-10-06

## 2023-09-30 MED ORDER — PROMETHAZINE-DM 6.25-15 MG/5ML PO SYRP: 5.0000 mL | ORAL_SOLUTION | Freq: Three times a day (TID) | ORAL | 0 refills | Status: DC | PRN

## 2023-09-30 NOTE — Discharge Instructions (Addendum)
We will notify you of your test results as they arrive and may take between about 24 hours.  I encourage you to sign up for MyChart if you have not already done so as this can be the easiest way for [place] to communicate results to you online or through a phone app.  Generally, we only contact you if it is a positive test result.  In the meantime, if you develop worsening symptoms including fever, chest pain, shortness of breath despite our current treatment plan then please report to the emergency room as this may be a sign of worsening status from possible viral infection.  Otherwise, we will manage this as a viral sinobronchitis. For sore throat or cough try using a honey-based tea. Use 3 teaspoons of honey with juice squeezed from half lemon. Place shaved pieces of ginger into 1/2-1 cup of water and warm over stove top. Then mix the ingredients and repeat every 4 hours as needed. Please take Tylenol 500mg -650mg  every 6 hours for aches and pains, fevers. Hydrate very well with at least 2 liters of water. Eat light meals such as soups to replenish electrolytes and soft fruits, veggies. Start an antihistamine like Zyrtec (10mg  daily) for postnasal drainage, sinus congestion.  You can take this together with prednisone and albuterol.  Use the cough medications as needed.

## 2023-09-30 NOTE — ED Provider Notes (Signed)
[facility] - URGENT [facility]ARE CENTER  Note:  This document was prepared using Conservation officer, historic buildings and may include unintentional dictation errors.  MRN: [id number] DOB: [birth year]  Subjective:   Cody [name] is a 77 y.o. male presenting for 2-day history of acute onset sinus congestion, chest congestion, malaise, fatigue, difficulty breathing.  Has been using over-the-counter medications without relief.  No history of asthma.  No smoking of any kind including cigarettes, cigars, vaping, marijuana use.    No current facility-administered medications for this encounter.  Current Outpatient Medications:    atorvastatin (LIPITOR) 20 MG tablet, Take 1 tablet (20 mg total) by mouth daily after supper., Disp: 90 tablet, Rfl: 3   azelastine (ASTELIN) 0.1 % nasal spray, Place into both nostrils 2 (two) times daily. Use in each nostril as directed, Disp: , Rfl:    fluticasone (FLONASE) 50 MCG/ACT nasal spray, Place 2 sprays into both nostrils daily as needed for allergies. , Disp: , Rfl:    guaiFENesin (MUCINEX) 600 MG 12 hr tablet, Take by mouth 2 (two) times daily., Disp: , Rfl:    ibuprofen (ADVIL,MOTRIN) 200 MG tablet, Take 200 mg by mouth every 6 (six) hours as needed., Disp: , Rfl:    loratadine (CLARITIN) 10 MG tablet, Take 10 mg by mouth daily., Disp: , Rfl:    losartan (COZAAR) 50 MG tablet, Take 1 tablet (50 mg total) by mouth daily., Disp: 90 tablet, Rfl: 3   omeprazole (PRILOSEC) 20 MG capsule, Take 1 capsule (20 mg total) by mouth daily., Disp: 90 capsule, Rfl: 3   Phenylephrine-Acetaminophen (TYLENOL SINUS CONGESTION/PAIN PO), Take by mouth as needed., Disp: , Rfl:    polyethylene glycol (MIRALAX / GLYCOLAX) 17 g packet, Take 17 g by mouth daily., Disp: , Rfl:    Wheat Dextrin (BENEFIBER DRINK MIX PO), Take by mouth., Disp: , Rfl:    Allergies  Allergen Reactions   Codeine Other (See Comments)    "Can't take in large doses"    Past Medical History:  Diagnosis  Date   Allergy 1960   Arthritis    Basal cell carcinoma 09/12/2017   RIGHT SIDEBURN TX CX3 5FU CAUTERY   Cancer (HCC)    Cataract 2016   Constipation    Environmental allergies    GERD (gastroesophageal reflux disease)    Headache    "bad sinus headache"   History of shingles    History of skin cancer    Hyperlipidemia    Hypertension 05-25-2022   Nocturia    SCC (squamous cell carcinoma) 01/20/2009   LEFT INNER EAR CX3 5FU   SCC (squamous cell carcinoma) 05/05/2010   LEFT PARASPINAL   SCC (squamous cell carcinoma) 09/12/2017   RIGHT NECK TX CX3 5FU   SCC (squamous cell carcinoma) 09/12/2017   RIGHT UPPER BACK TX CX3 5FU CAUTERY   SCC (squamous cell carcinoma) 10/30/2019   LOWER STERNUM TX CX3 5FU CAUTERY   Sleep apnea    Cpap set 10-16   Sleep apnea, obstructive    cpap   Squamous cell carcinoma of skin 01/20/2009   LEFT LOW PARASPINAL CX3 5FU     Past Surgical History:  Procedure Laterality Date   CATARACT EXTRACTION W/ INTRAOCULAR LENS IMPLANT Bilateral 10/25/2014   cataracts removed   EYE SURGERY  2016   Lense Implants   INTERPHALANGEAL JOINT ARTHROPLASTY Left    Great toe   JOINT REPLACEMENT  2016-2017   Both Hips   TOTAL HIP ARTHROPLASTY Left 09/04/2015  Procedure: LEFT TOTAL HIP ARTHROPLASTY ANTERIOR APPROACH;  Surgeon: Samson Frederic, MD;  Location: [facility];  Service: Orthopedics;  Laterality: Left;   TOTAL HIP ARTHROPLASTY Right 03/25/2016   Procedure: RIGHT TOTAL HIP ARTHROPLASTY ANTERIOR APPROACH;  Surgeon: Samson Frederic, MD;  Location: [facility];  Service: Orthopedics;  Laterality: Right;    Family History  Problem Relation Age of Onset   Heart failure Mother    Heart Problems Mother        pacer   Alcohol abuse Mother    Arthritis Mother    Drug abuse Mother    Stroke Father    Hearing loss Father    Fragile X syndrome Father    Hypertension Sister    Other Sister        healthty   Other Son        healthy   Other Son        healthy     Social History   Tobacco Use   Smoking status: Former    Current packs/day: 0.00    Average packs/day: 1.5 packs/day for 15.0 years (22.5 ttl pk-yrs)    Types: Cigarettes    Start date: 08/25/1967    Quit date: 08/24/1982    Years since quitting: 41.1   Smokeless tobacco: Never  Vaping Use   Vaping status: Never Used  Substance Use Topics   Alcohol use: Yes    Alcohol/week: 5.0 standard drinks of alcohol    Types: 5 Standard drinks or equivalent per week    Comment: 1 drink daily   Drug use: No    ROS   Objective:   Vitals: BP 118/79 (BP Location: Right Arm)   Pulse 98   Temp 98.9 F (37.2 C) (Oral)   Resp 20   SpO2 96%   Physical Exam Constitutional:      General: He is not in acute distress.    Appearance: Normal appearance. He is well-developed and normal weight. He is not ill-appearing, toxic-appearing or diaphoretic.  HENT:     Head: Normocephalic and atraumatic.     Right Ear: External ear normal.     Left Ear: External ear normal.     Nose: Congestion present. No rhinorrhea.     Mouth/Throat:     Mouth: Mucous membranes are moist.     Pharynx: No pharyngeal swelling, oropharyngeal exudate, posterior oropharyngeal erythema or uvula swelling.     Tonsils: No tonsillar exudate or tonsillar abscesses. 0 on the right. 0 on the left.  Eyes:     General: No scleral icterus.       Right eye: No discharge.        Left eye: No discharge.     Extraocular Movements: Extraocular movements intact.  Cardiovascular:     Rate and Rhythm: Normal rate and regular rhythm.     Heart sounds: Normal heart sounds. No murmur heard.    No friction rub. No gallop.  Pulmonary:     Effort: Pulmonary effort is normal. No respiratory distress.     Breath sounds: No stridor. Rhonchi (mild over mid lung fields bilaterally) present. No wheezing or rales.  Musculoskeletal:     Cervical back: Normal range of motion.  Neurological:     Mental Status: He is alert and oriented to  person, place, and time.  Psychiatric:        Mood and Affect: Mood normal.        Behavior: Behavior normal.  Thought Content: Thought content normal.        Judgment: Judgment normal.     Assessment and Plan :   PDMP not reviewed this encounter.  1. Sinobronchitis   2. Chest congestion    Creatinine clearance calculated at 68mL/min.  Recommended supportive care, prednisone and albuterol for a respiratory boost. X-ray over-read was pending at time of discharge, recommended follow up with only abnormal results. Otherwise will not call for negative over-read. Patient was in agreement. Counseled patient on potential for adverse effects with medications prescribed/recommended today, ER and return-to-clinic precautions discussed, patient verbalized understanding.    Wallis Bamberg, PA-C 09/30/23 1243

## 2023-09-30 NOTE — ED Triage Notes (Addendum)
Pt c/o clear prod cough, "hard to breath because of the congestion", nasal congestion day 2-taking alka seltzer cold without relief-NAD-steady gait

## 2023-10-01 LAB — SARS CORONAVIRUS 2 (TAT 6-24 HRS): SARS Coronavirus 2: NEGATIVE

## 2023-10-04 ENCOUNTER — Ambulatory Visit (INDEPENDENT_AMBULATORY_CARE_PROVIDER_SITE_OTHER): Payer: Medicare HMO | Admitting: Family Medicine

## 2023-10-04 ENCOUNTER — Encounter: Payer: Self-pay | Admitting: Family Medicine

## 2023-10-04 VITALS — BP 144/78 | HR 86 | Temp 98.2°F | Ht 72.0 in | Wt 264.8 lb

## 2023-10-04 DIAGNOSIS — E782 Mixed hyperlipidemia: Secondary | ICD-10-CM | POA: Diagnosis not present

## 2023-10-04 DIAGNOSIS — I10 Essential (primary) hypertension: Secondary | ICD-10-CM

## 2023-10-04 DIAGNOSIS — N183 Chronic kidney disease, stage 3 unspecified: Secondary | ICD-10-CM

## 2023-10-04 MED ORDER — LOSARTAN POTASSIUM-HCTZ 50-12.5 MG PO TABS: 1.0000 | ORAL_TABLET | Freq: Every day | ORAL | 3 refills | Status: DC

## 2023-10-04 NOTE — Assessment & Plan Note (Signed)
Continue focus on blood pressure, adequate hydration, and avoidance of nephrotoxic medications.

## 2023-10-04 NOTE — Assessment & Plan Note (Signed)
Lipids at goal. Continue atorvastatin 20 mg daily.

## 2023-10-04 NOTE — Assessment & Plan Note (Signed)
Blood pressure is high today. He has been on a recent prednisone course for bronchitis, which could have impacted this, though he notes he regularly sees his systolic blood pressure around 140.  I will switch him to losartan-HCTZ (Hyzaar) 50-12.5 mg daily. I recommend he check his BP daily at home and send me a log in about 1 month.

## 2023-10-04 NOTE — Progress Notes (Signed)
[facility] PRIMARY CARE [facility] PRIMARY Trecia Rogers [facility] [address] [place] [address] Dept: [phone] Dept Fax: [phone]  Chronic Care Office Visit  Subjective:    Patient ID: Cody [name], male    DOB: 04-Mar-1946, 77 y.o..   MRN: [id number]  Chief Complaint  Patient presents with   Hypertension    3 month f/u HTN.  Average BP @ home  140/75 - 145/80.   Fasting today.    History of Present Illness:  Patient is in today for reassessment of chronic medical issues.  Mr. Ketterer has a history of hypertension. He is managed on losartan 50 mg daily. He also has had some moderate chronic kidney disease, though his last eGFR was 61.  Mr. Below has a history of hyperlipidemia. He is managed on atorvastatin 20 mg daily.  Past Medical History: Patient Active Problem List   Diagnosis Date Noted   Actinic keratoses 07/05/2023   Diastasis recti 07/05/2023   Neurosensory deafness 07/05/2023   Osteoarthritis of multiple joints 07/05/2023   Essential hypertension 04/19/2023   GERD (gastroesophageal reflux disease) 04/19/2023   Class 2 obesity due to excess calories with body mass index (BMI) of 35.0 to 35.9 in adult 04/19/2023   CKD (chronic kidney disease) stage 3, GFR 30-59 ml/min (HCC) 04/19/2023   Prediabetes 04/19/2023   Obstructive sleep apnea on CPAP 06/29/2017   Hyperlipidemia 06/20/2015   Seasonal allergic rhinitis 01/21/2009   Past Surgical History:  Procedure Laterality Date   CATARACT EXTRACTION W/ INTRAOCULAR LENS IMPLANT Bilateral 10/25/2014   cataracts removed   EYE SURGERY  2016   Lense Implants   INTERPHALANGEAL JOINT ARTHROPLASTY Left    Great toe   JOINT REPLACEMENT  2016-2017   Both Hips   TOTAL HIP ARTHROPLASTY Left 09/04/2015   Procedure: LEFT TOTAL HIP ARTHROPLASTY ANTERIOR APPROACH;  Surgeon: Samson Frederic, MD;  [facility];  Service: Orthopedics;  Laterality: Left;   TOTAL HIP ARTHROPLASTY Right 03/25/2016   Procedure: RIGHT  TOTAL HIP ARTHROPLASTY ANTERIOR APPROACH;  Surgeon: Samson Frederic, MD;  [facility];  Service: Orthopedics;  Laterality: Right;   Family History  Problem Relation Age of Onset   Heart failure Mother    Heart Problems Mother        pacer   Alcohol abuse Mother    Arthritis Mother    Drug abuse Mother    Stroke Father    Hearing loss Father    Fragile X syndrome Father    Hypertension Sister    Other Sister        healthty   Other Son        healthy   Other Son        healthy   Outpatient Medications Prior to Visit  Medication Sig Dispense Refill   albuterol (VENTOLIN HFA) 108 (90 Base) MCG/ACT inhaler Inhale 1-2 puffs into the lungs every 6 (six) hours as needed for wheezing or shortness of breath. 18 g 0   atorvastatin (LIPITOR) 20 MG tablet Take 1 tablet (20 mg total) by mouth daily after supper. 90 tablet 3   azelastine (ASTELIN) 0.1 % nasal spray Place into both nostrils 2 (two) times daily. Use in each nostril as directed     fluticasone (FLONASE) 50 MCG/ACT nasal spray Place 2 sprays into both nostrils daily as needed for allergies.      guaiFENesin (MUCINEX) 600 MG 12 hr tablet Take by mouth 2 (two) times daily.     ibuprofen (ADVIL,MOTRIN) 200  MG tablet Take 200 mg by mouth every 6 (six) hours as needed.     loratadine (CLARITIN) 10 MG tablet Take 10 mg by mouth daily.     omeprazole (PRILOSEC) 20 MG capsule Take 1 capsule (20 mg total) by mouth daily. 90 capsule 3   Phenylephrine-Acetaminophen (TYLENOL SINUS CONGESTION/PAIN PO) Take by mouth as needed.     polyethylene glycol (MIRALAX / GLYCOLAX) 17 g packet Take 17 g by mouth daily.     predniSONE (DELTASONE) 10 MG tablet Take 3 tablets (30 mg total) by mouth daily with breakfast. 15 tablet 0   promethazine-dextromethorphan (PROMETHAZINE-DM) 6.25-15 MG/5ML syrup Take 5 mLs by mouth 3 (three) times daily as needed for cough. 200 mL 0   Wheat Dextrin (BENEFIBER DRINK MIX PO) Take by mouth.     losartan (COZAAR) 50 MG  tablet Take 1 tablet (50 mg total) by mouth daily. 90 tablet 3   No facility-administered medications prior to visit.   Allergies  Allergen Reactions   Codeine Other (See Comments)    "Can't take in large doses"   Objective:   Today's Vitals   10/04/23 0812  BP: (!) 142/80  Pulse: 86  Temp: 98.2 F (36.8 C)  TempSrc: Temporal  SpO2: 96%  Weight: 264 lb 12.8 oz (120.1 kg)  Height: 6' (1.829 m)   Body mass index is 35.91 kg/m.   General: Well developed, well nourished. No acute distress. Lungs: Clear to auscultation bilaterally. No wheezing, rales or rhonchi. CV: RRR without murmurs or rubs. Pulses 2+ bilaterally. Psych: Alert and oriented. Normal mood and affect.  There are no preventive care reminders to display for this patient.    Assessment & Plan:   Problem List Items Addressed This Visit       Cardiovascular and Mediastinum   Essential hypertension - Primary    Blood pressure is high today. He has been on a recent prednisone course for bronchitis, which could have impacted this, though he notes he regularly sees his systolic blood pressure around 140.  I will switch him to losartan-HCTZ (Hyzaar) 50-12.5 mg daily. I recommend he check his BP daily at home and send me a log in about 1 month.      Relevant Medications   losartan-hydrochlorothiazide (HYZAAR) 50-12.5 MG tablet     Genitourinary   CKD (chronic kidney disease) stage 3, GFR 30-59 ml/min (HCC)    Continue focus on blood pressure, adequate hydration, and avoidance of nephrotoxic medications.         Other   Hyperlipidemia    Lipids at goal. Continue atorvastatin 20 mg daily.      Relevant Medications   losartan-hydrochlorothiazide (HYZAAR) 50-12.5 MG tablet    Return in about 3 months (around 01/02/2024) for Reassessment.   Loyola Mast, MD

## 2023-10-05 ENCOUNTER — Other Ambulatory Visit: Payer: Self-pay

## 2023-10-05 ENCOUNTER — Encounter (HOSPITAL_BASED_OUTPATIENT_CLINIC_OR_DEPARTMENT_OTHER): Payer: Self-pay | Admitting: Internal Medicine

## 2023-10-05 ENCOUNTER — Emergency Department (HOSPITAL_BASED_OUTPATIENT_CLINIC_OR_DEPARTMENT_OTHER): Payer: Medicare HMO

## 2023-10-05 ENCOUNTER — Observation Stay (HOSPITAL_BASED_OUTPATIENT_CLINIC_OR_DEPARTMENT_OTHER)
Admission: EM | Admit: 2023-10-05 | Discharge: 2023-10-06 | Disposition: A | Discharge status: Home / Self Care | Payer: Medicare HMO | Attending: Internal Medicine | Admitting: Internal Medicine

## 2023-10-05 DIAGNOSIS — E876 Hypokalemia: Secondary | ICD-10-CM | POA: Diagnosis present

## 2023-10-05 DIAGNOSIS — Z85828 Personal history of other malignant neoplasm of skin: Secondary | ICD-10-CM | POA: Insufficient documentation

## 2023-10-05 DIAGNOSIS — R932 Abnormal findings on diagnostic imaging of liver and biliary tract: Secondary | ICD-10-CM | POA: Insufficient documentation

## 2023-10-05 DIAGNOSIS — J101 Influenza due to other identified influenza virus with other respiratory manifestations: Secondary | ICD-10-CM | POA: Diagnosis not present

## 2023-10-05 DIAGNOSIS — G4733 Obstructive sleep apnea (adult) (pediatric): Secondary | ICD-10-CM | POA: Diagnosis present

## 2023-10-05 DIAGNOSIS — E782 Mixed hyperlipidemia: Secondary | ICD-10-CM

## 2023-10-05 DIAGNOSIS — Z1152 Encounter for screening for COVID-19: Secondary | ICD-10-CM | POA: Diagnosis not present

## 2023-10-05 DIAGNOSIS — K219 Gastro-esophageal reflux disease without esophagitis: Secondary | ICD-10-CM | POA: Diagnosis not present

## 2023-10-05 DIAGNOSIS — R509 Fever, unspecified: Secondary | ICD-10-CM | POA: Diagnosis present

## 2023-10-05 DIAGNOSIS — R6520 Severe sepsis without septic shock: Secondary | ICD-10-CM | POA: Diagnosis not present

## 2023-10-05 DIAGNOSIS — Z96643 Presence of artificial hip joint, bilateral: Secondary | ICD-10-CM | POA: Diagnosis not present

## 2023-10-05 DIAGNOSIS — A419 Sepsis, unspecified organism: Secondary | ICD-10-CM | POA: Insufficient documentation

## 2023-10-05 DIAGNOSIS — J111 Influenza due to unidentified influenza virus with other respiratory manifestations: Principal | ICD-10-CM

## 2023-10-05 DIAGNOSIS — I480 Paroxysmal atrial fibrillation: Secondary | ICD-10-CM | POA: Diagnosis not present

## 2023-10-05 DIAGNOSIS — I10 Essential (primary) hypertension: Secondary | ICD-10-CM | POA: Diagnosis not present

## 2023-10-05 DIAGNOSIS — Z0389 Encounter for observation for other suspected diseases and conditions ruled out: Secondary | ICD-10-CM | POA: Diagnosis not present

## 2023-10-05 DIAGNOSIS — Z87891 Personal history of nicotine dependence: Secondary | ICD-10-CM | POA: Diagnosis not present

## 2023-10-05 DIAGNOSIS — C44622 Squamous cell carcinoma of skin of right upper limb, including shoulder: Secondary | ICD-10-CM | POA: Diagnosis not present

## 2023-10-05 DIAGNOSIS — I4891 Unspecified atrial fibrillation: Secondary | ICD-10-CM | POA: Diagnosis not present

## 2023-10-05 DIAGNOSIS — R000 Tachycardia, unspecified: Secondary | ICD-10-CM

## 2023-10-05 DIAGNOSIS — J439 Emphysema, unspecified: Secondary | ICD-10-CM | POA: Diagnosis not present

## 2023-10-05 DIAGNOSIS — R059 Cough, unspecified: Secondary | ICD-10-CM | POA: Diagnosis not present

## 2023-10-05 DIAGNOSIS — E785 Hyperlipidemia, unspecified: Secondary | ICD-10-CM | POA: Diagnosis present

## 2023-10-05 DIAGNOSIS — R918 Other nonspecific abnormal finding of lung field: Secondary | ICD-10-CM | POA: Diagnosis not present

## 2023-10-05 DIAGNOSIS — R161 Splenomegaly, not elsewhere classified: Secondary | ICD-10-CM | POA: Diagnosis not present

## 2023-10-05 LAB — LACTIC ACID, PLASMA
Lactic Acid, Venous: 2.1 mmol/L (ref 0.5–1.9)
Lactic Acid, Venous: 1.8 mmol/L (ref 0.5–1.9)

## 2023-10-05 LAB — RESP PANEL BY RT-PCR (RSV, FLU A&B, COVID)  RVPGX2
Influenza A by PCR: POSITIVE — AB
Influenza B by PCR: NEGATIVE
Resp Syncytial Virus by PCR: NEGATIVE
SARS Coronavirus 2 by RT PCR: NEGATIVE

## 2023-10-05 LAB — CBC WITH DIFFERENTIAL/PLATELET
Abs Immature Granulocytes: 0.05 10*3/uL (ref 0.00–0.07)
Basophils Absolute: 0 10*3/uL (ref 0.0–0.1)
Basophils Relative: 0 %
Eosinophils Absolute: 0 10*3/uL (ref 0.0–0.5)
Eosinophils Relative: 0 %
HCT: 40.2 % (ref 39.0–52.0)
Hemoglobin: 14.4 g/dL (ref 13.0–17.0)
Immature Granulocytes: 1 %
Lymphocytes Relative: 1 %
Lymphs Abs: 0.1 10*3/uL — ABNORMAL LOW (ref 0.7–4.0)
MCH: 29.6 pg (ref 26.0–34.0)
MCHC: 35.8 g/dL (ref 30.0–36.0)
MCV: 82.5 fL (ref 80.0–100.0)
Monocytes Absolute: 0.2 10*3/uL (ref 0.1–1.0)
Monocytes Relative: 2 %
Neutro Abs: 8.6 10*3/uL — ABNORMAL HIGH (ref 1.7–7.7)
Neutrophils Relative %: 96 %
Platelets: 160 10*3/uL (ref 150–400)
RBC: 4.87 MIL/uL (ref 4.22–5.81)
RDW: 13.6 % (ref 11.5–15.5)
WBC: 9 10*3/uL (ref 4.0–10.5)
nRBC: 0 % (ref 0.0–0.2)

## 2023-10-05 LAB — COMPREHENSIVE METABOLIC PANEL WITH GFR
ALT: 31 U/L (ref 0–44)
AST: 31 U/L (ref 15–41)
Albumin: 3.8 g/dL (ref 3.5–5.0)
Alkaline Phosphatase: 63 U/L (ref 38–126)
Anion gap: 8 (ref 5–15)
BUN: 11 mg/dL (ref 8–23)
CO2: 23 mmol/L (ref 22–32)
Calcium: 8.4 mg/dL — ABNORMAL LOW (ref 8.9–10.3)
Chloride: 101 mmol/L (ref 98–111)
Creatinine, Ser: 1.25 mg/dL — ABNORMAL HIGH (ref 0.61–1.24)
GFR, Estimated: >60 mL/min
Glucose, Bld: 117 mg/dL — ABNORMAL HIGH (ref 70–99)
Potassium: 3.2 mmol/L — ABNORMAL LOW (ref 3.5–5.1)
Sodium: 132 mmol/L — ABNORMAL LOW (ref 135–145)
Total Bilirubin: 1.3 mg/dL — ABNORMAL HIGH
Total Protein: 6.7 g/dL (ref 6.5–8.1)

## 2023-10-05 LAB — PROTIME-INR
INR: 1 (ref 0.8–1.2)
Prothrombin Time: 13.7 s (ref 11.4–15.2)

## 2023-10-05 LAB — MAGNESIUM: Magnesium: 1.3 mg/dL — ABNORMAL LOW (ref 1.7–2.4)

## 2023-10-05 LAB — APTT: aPTT: 27 s (ref 24–36)

## 2023-10-05 MED ORDER — ACETAMINOPHEN 325 MG PO TABS: 650.0000 mg | ORAL_TABLET | Freq: Four times a day (QID) | ORAL | Status: DC | PRN

## 2023-10-05 MED ORDER — METOPROLOL TARTRATE 5 MG/5ML IV SOLN: 5.0000 mg | INTRAVENOUS | Status: DC | PRN

## 2023-10-05 MED ORDER — IOHEXOL 350 MG/ML SOLN
75.0000 mL | Freq: Once | INTRAVENOUS | Status: AC | PRN
  Administered 2023-10-05: 75 mL via INTRAVENOUS

## 2023-10-05 MED ORDER — VERAPAMIL HCL 2.5 MG/ML IV SOLN: 5.0000 mg | Freq: Once | INTRAVENOUS | Status: DC

## 2023-10-05 MED ORDER — IBUPROFEN 800 MG PO TABS
800.0000 mg | ORAL_TABLET | Freq: Once | ORAL | Status: AC
  Administered 2023-10-05: 800 mg via ORAL
  Filled 2023-10-05: qty 1

## 2023-10-05 MED ORDER — LACTATED RINGERS IV BOLUS
1000.0000 mL | Freq: Once | INTRAVENOUS | Status: AC
  Administered 2023-10-05: 1000 mL via INTRAVENOUS

## 2023-10-05 MED ORDER — SODIUM CHLORIDE 0.9 % IV SOLN
2.0000 g | INTRAVENOUS | Status: DC
  Administered 2023-10-05: 2 g via INTRAVENOUS
  Filled 2023-10-05: qty 20

## 2023-10-05 MED ORDER — POTASSIUM CHLORIDE 10 MEQ/100ML IV SOLN
10.0000 meq | Freq: Once | INTRAVENOUS | Status: AC
  Administered 2023-10-05: 10 meq via INTRAVENOUS
  Filled 2023-10-05: qty 100

## 2023-10-05 MED ORDER — OSELTAMIVIR PHOSPHATE 75 MG PO CAPS
75.0000 mg | ORAL_CAPSULE | Freq: Once | ORAL | Status: AC
  Administered 2023-10-05: 75 mg via ORAL
  Filled 2023-10-05: qty 1

## 2023-10-05 MED ORDER — CALCIUM GLUCONATE-NACL 1-0.675 GM/50ML-% IV SOLN
1.0000 g | Freq: Once | INTRAVENOUS | Status: AC
  Administered 2023-10-05: 1000 mg via INTRAVENOUS
  Filled 2023-10-05: qty 50

## 2023-10-05 MED ORDER — DILTIAZEM HCL-DEXTROSE 125-5 MG/125ML-% IV SOLN (PREMIX)
5.0000 mg/h | INTRAVENOUS | Status: DC
  Administered 2023-10-05: 5 mg/h via INTRAVENOUS
  Filled 2023-10-05: qty 125

## 2023-10-05 MED ORDER — AMIODARONE HCL IN DEXTROSE 360-4.14 MG/200ML-% IV SOLN
60.0000 mg/h | INTRAVENOUS | Status: AC
  Administered 2023-10-05 (×2): 60 mg/h via INTRAVENOUS
  Filled 2023-10-05: qty 200

## 2023-10-05 MED ORDER — AMIODARONE LOAD VIA INFUSION
150.0000 mg | Freq: Once | INTRAVENOUS | Status: AC
  Administered 2023-10-05: 150 mg via INTRAVENOUS
  Filled 2023-10-05: qty 83.34

## 2023-10-05 MED ORDER — MAGNESIUM SULFATE 2 GM/50ML IV SOLN
2.0000 g | Freq: Once | INTRAVENOUS | Status: AC
  Administered 2023-10-05: 2 g via INTRAVENOUS
  Filled 2023-10-05: qty 50

## 2023-10-05 MED ORDER — MELATONIN 3 MG PO TABS: 3.0000 mg | ORAL_TABLET | Freq: Every evening | ORAL | Status: DC | PRN

## 2023-10-05 MED ORDER — ONDANSETRON HCL 4 MG/2ML IJ SOLN: 4.0000 mg | Freq: Four times a day (QID) | INTRAMUSCULAR | Status: DC | PRN

## 2023-10-05 MED ORDER — SODIUM CHLORIDE 0.9 % IV SOLN
500.0000 mg | INTRAVENOUS | Status: DC
  Administered 2023-10-05: 500 mg via INTRAVENOUS
  Filled 2023-10-05: qty 5

## 2023-10-05 MED ORDER — AMIODARONE HCL IN DEXTROSE 360-4.14 MG/200ML-% IV SOLN
30.0000 mg/h | INTRAVENOUS | Status: DC
  Administered 2023-10-05 – 2023-10-06 (×3): 30 mg/h via INTRAVENOUS
  Filled 2023-10-05 (×3): qty 200

## 2023-10-05 MED ORDER — DILTIAZEM HCL 25 MG/5ML IV SOLN
15.0000 mg | Freq: Once | INTRAVENOUS | Status: AC
  Administered 2023-10-05: 15 mg via INTRAVENOUS
  Filled 2023-10-05: qty 5

## 2023-10-05 MED ORDER — ACETAMINOPHEN 325 MG PO TABS
650.0000 mg | ORAL_TABLET | Freq: Four times a day (QID) | ORAL | Status: DC | PRN
  Administered 2023-10-05: 650 mg via ORAL
  Filled 2023-10-05: qty 2

## 2023-10-05 MED ORDER — POTASSIUM CHLORIDE CRYS ER 20 MEQ PO TBCR
40.0000 meq | EXTENDED_RELEASE_TABLET | Freq: Once | ORAL | Status: AC
  Administered 2023-10-05: 40 meq via ORAL
  Filled 2023-10-05: qty 2

## 2023-10-05 MED ORDER — ACETAMINOPHEN 650 MG RE SUPP: 650.0000 mg | Freq: Four times a day (QID) | RECTAL | Status: DC | PRN

## 2023-10-05 MED ORDER — IPRATROPIUM-ALBUTEROL 0.5-2.5 (3) MG/3ML IN SOLN
3.0000 mL | RESPIRATORY_TRACT | Status: DC | PRN
  Administered 2023-10-05: 3 mL via RESPIRATORY_TRACT
  Filled 2023-10-05: qty 3

## 2023-10-05 NOTE — ED Notes (Signed)
[facility] called , No ETA will be the next patient to be picked up. [facility] was contacted @ 4:32p for transport Called @ 19:41

## 2023-10-05 NOTE — H&P (Signed)
History and Physical      Cody [name] [name] ZSW:[id number] DOB: [birth year] DOA: 10/05/2023; DOS: 10/05/2023  PCP: Cody Mast, MD  Patient coming from: home   I have personally briefly reviewed patient's old medical records in [facility] Link  Chief Complaint: Cough  HPI: Cody [name] is a 77 y.o. male with medical history significant for hyperlipidemia, obstructive sleep apnea on home nocturnal CPAP, GERD, who is admitted to [facility] on 10/05/2023 by way of transfer from [facility] with new diagnosis of atrial fibrillation with RVR after presenting from home to the facility complaining of cough.   The patient reports of progressive productive cough associated with fever, generalized myalgias, the absence of chills or will by rigors.  He denies overt shortness of breath, or any recent orthopnea, PND, or worsening of peripheral edema.  No recent chest pain, diaphoresis, dizziness, presyncope, or syncope.  No recent rash, headache, neck stiffness, abdominal discomfort, diarrhea, dysuria or gross hematuria.  He denies any known history of prior atrial fibrillation.  Per chart review, no prior echocardiogram on file.     [facility] ED Course:  Vital signs in the ED were notable for the following: Temperature max 102.5, initially, patient was noted to be in atrial fibrillation with RVR, with heart rates in the 1 teens to 140s, initially, he received diltiazem 15 mg [name] x 1 dose followed by initiation of diltiazem drip, following which heart rate improved, but upon which the patient developed interval decline in systolic blood pressures from the 1 teens to 130s down into the high 80s.  At this point, diltiazem drip was discontinued and amiodarone drip was started, with ensuing systolic blood pressures improving into the low 100s, the patient subsequently converted to sinus rhythm.  Respiratory rate 14-23, oxygen saturation 94 to 100% on room  air.  Labs were notable for the following: CMP notable for the following: Potassium 3.2, bicarb 23, creatinine 1.25, glucose 117.  Magnesium level 1.3.  Initial lactic acid 2.1, repeat value trending down to 1.8.  CBC notable for white blood cell count 9000.  Blood cultures x 2 were collected prior to initiation of any [name] antibiotics.  COVID and RSV PCR negative, We will influenza A PCR was found to be positive.  Per my interpretation, EKG in ED demonstrated the following: Atrial fibrillation with RVR, with heart rate 150, normal intervals, no evidence of T wave changes, while showing nonspecific less than 1 mm ST depression limited to the lead V3.  Imaging in the ED, per corresponding formal radiology read, was notable for the following: CTA chest with PE protocol showed no evidence of acute pulmonary embolism, showing minimal emphysema in both lung apices, asymmetric haziness in the posterior basal left lower lobe which could represent pneumonitis versus atelectasis, will and also showing a 1.5 cm hypodensity in the dome of the right hepatic lobe.  CTA chest showed no evidence of infiltrate, edema, pneumothorax, or pleural effusion.  While in the ED, the following were administered: Diltiazem 15 mg [name] x 1 dose followed by initiation of diltiazem drip, which was subsequently discontinued; amiodarone 150 mg [name] load, followed by initiation of [name] amiodarone infusion, Advil 800 mg p.o. x 1 dose, Tamiflu, potassium chloride 40 mEq p.o. x 1 dose, potassium chloride 10 mill equivalents [name] over 1 hour.  Azithromycin, Rocephin, calcium gluconate x 1 g [name], lactated Ringer's x 3 L bolus, magnesium sulfate 2 g [name] over 2  hours, acetaminophen 650 mg p.o. x 1 dose, DuoNeb as a treatment x 1.  Subsequently, the patient was admitted to [facility] for further evaluation management of new diagnosis of atrial fibrillation with RVR, in the setting of severe sepsis due to influenza A infection, with presenting labs notable for  hypokalemia as well as hypomagnesemia.  ***red    Review of Systems: As per HPI otherwise 10 point review of systems negative.   Past Medical History:  Diagnosis Date   Allergy 1960   Arthritis    Basal cell carcinoma 09/12/2017   RIGHT SIDEBURN TX CX3 5FU CAUTERY   Cancer (HCC)    Cataract 2016   Constipation    Environmental allergies    GERD (gastroesophageal reflux disease)    Headache    "bad sinus headache"   History of shingles    History of skin cancer    Hyperlipidemia    Hypertension 05-25-2022   Nocturia    SCC (squamous cell carcinoma) 01/20/2009   LEFT INNER EAR CX3 5FU   SCC (squamous cell carcinoma) 05/05/2010   LEFT PARASPINAL   SCC (squamous cell carcinoma) 09/12/2017   RIGHT NECK TX CX3 5FU   SCC (squamous cell carcinoma) 09/12/2017   RIGHT UPPER BACK TX CX3 5FU CAUTERY   SCC (squamous cell carcinoma) 10/30/2019   LOWER STERNUM TX CX3 5FU CAUTERY   Sleep apnea    Cpap set 10-16   Sleep apnea, obstructive    cpap   Squamous cell carcinoma of skin 01/20/2009   LEFT LOW PARASPINAL CX3 5FU    Past Surgical History:  Procedure Laterality Date   CATARACT EXTRACTION W/ INTRAOCULAR LENS IMPLANT Bilateral 10/25/2014   cataracts removed   EYE SURGERY  2016   Lense Implants   INTERPHALANGEAL JOINT ARTHROPLASTY Left    Great toe   JOINT REPLACEMENT  2016-2017   Both Hips   TOTAL HIP ARTHROPLASTY Left 09/04/2015   Procedure: LEFT TOTAL HIP ARTHROPLASTY ANTERIOR APPROACH;  Surgeon: Samson Frederic, MD;  Location: [facility] ORS;  Service: Orthopedics;  Laterality: Left;   TOTAL HIP ARTHROPLASTY Right 03/25/2016   Procedure: RIGHT TOTAL HIP ARTHROPLASTY ANTERIOR APPROACH;  Surgeon: Samson Frederic, MD;  Location: [facility] ORS;  Service: Orthopedics;  Laterality: Right;    Social History:  reports that he quit smoking about 41 years ago. His smoking use included cigarettes. He started smoking about 56 years ago. He has a 22.5 pack-year smoking history. He has never used  smokeless tobacco. He reports current alcohol use of about 5.0 standard drinks of alcohol per week. He reports that he does not use drugs.   Allergies  Allergen Reactions   Codeine Nausea And Vomiting and Other (See Comments)    "Can't take in large doses"    Family History  Problem Relation Age of Onset   Heart failure Mother    Heart Problems Mother        pacer   Alcohol abuse Mother    Arthritis Mother    Drug abuse Mother    Stroke Father    Hearing loss Father    Fragile X syndrome Father    Hypertension Sister    Other Sister        healthty   Other Son        healthy   Other Son        healthy    Family history reviewed and not pertinent ***   Prior to Admission medications   Medication Sig Start Date  End Date Taking? Authorizing Provider  albuterol (VENTOLIN HFA) 108 (90 Base) MCG/ACT inhaler Inhale 1-2 puffs into the lungs every 6 (six) hours as needed for wheezing or shortness of breath. 09/30/23  Yes Wallis Bamberg, PA-C  atorvastatin (LIPITOR) 20 MG tablet Take 1 tablet (20 mg total) by mouth daily after supper. 07/25/23  Yes Cody Mast, MD  azelastine (ASTELIN) 0.1 % nasal spray Place into both nostrils 2 (two) times daily. Use in each nostril as directed   Yes [provider]  fluticasone (FLONASE) 50 MCG/ACT nasal spray Place 2 sprays into both nostrils daily as needed for allergies.    Yes [provider]  guaiFENesin (MUCINEX) 600 MG 12 hr tablet Take 600 mg by mouth as needed for cough or to loosen phlegm.   Yes [provider]  ibuprofen (ADVIL,MOTRIN) 200 MG tablet Take 200 mg by mouth as needed for mild pain (pain score 1-3).   Yes [provider]  loratadine (CLARITIN) 10 MG tablet Take 10 mg by mouth daily as needed for allergies.   Yes [provider]  omeprazole (PRILOSEC) 20 MG capsule Take 1 capsule (20 mg total) by mouth daily. 07/25/23  Yes RuddBertram Millard, MD  Phenylephrine-Acetaminophen (TYLENOL  SINUS CONGESTION/PAIN PO) Take 1 capsule by mouth as needed (sinus).   Yes [provider]  polyethylene glycol (MIRALAX / GLYCOLAX) 17 g packet Take 17 g by mouth daily.   Yes [provider]  predniSONE (DELTASONE) 10 MG tablet Take 3 tablets (30 mg total) by mouth daily with breakfast. 09/30/23  Yes Wallis Bamberg, PA-C  promethazine-dextromethorphan (PROMETHAZINE-DM) 6.25-15 MG/5ML syrup Take 5 mLs by mouth 3 (three) times daily as needed for cough. 09/30/23  Yes Wallis Bamberg, PA-C  Wheat Dextrin (BENEFIBER DRINK MIX PO) Take 1 Dose by mouth daily.   Yes [provider]  losartan-hydrochlorothiazide (HYZAAR) 50-12.5 MG tablet Take 1 tablet by mouth daily. Patient not taking: Reported on 10/05/2023 10/04/23   Cody Mast, MD     Objective    Physical Exam: Vitals:   10/05/23 1735 10/05/23 1915 10/05/23 1930 10/05/23 2000  BP: 110/63 120/63 112/61 109/62  Pulse: 84 78 79 82  Resp: 18 12 17 14   Temp: 98.9 F (37.2 C)     TempSrc: Oral     SpO2: 97% 100% 98% 99%  Weight:      Height:        General: appears to be stated age; alert, oriented Skin: warm, dry, no rash Head:  AT/[place] Mouth:  Oral mucosa membranes appear moist, normal dentition Neck: supple; trachea midline Heart:  RRR; did not appreciate any M/R/G Lungs: CTAB, did not appreciate any wheezes, rales, or rhonchi Abdomen: + BS; soft, ND, NT Vascular: 2+ pedal pulses b/l; 2+ radial pulses b/l Extremities: no peripheral edema, no muscle wasting Neuro: strength and sensation intact in upper and lower extremities b/l ***   *** Neuro: 5/5 strength of the proximal and distal flexors and extensors of the upper and lower extremities bilaterally; sensation intact in upper and lower extremities b/l; cranial nerves II through XII grossly intact; no pronator drift; no evidence suggestive of slurred speech, dysarthria, or facial droop; Normal muscle tone. No tremors.  *** Neuro: In the setting of the  patient's current mental status and associated inability to follow instructions, unable to perform full neurologic exam at this time.  As such, assessment of strength, sensation, and cranial nerves is limited at this time. Patient noted to  spontaneously move all 4 extremities. No tremors.  ***    Labs on Admission: I have personally reviewed following labs and imaging studies  CBC: Recent Labs  Lab 10/05/23 0157  WBC 9.0  NEUTROABS 8.6*  HGB 14.4  HCT 40.2  MCV 82.5  PLT 160   Basic Metabolic Panel: Recent Labs  Lab 10/05/23 0157 10/05/23 0646  NA 132*  --   K 3.2*  --   CL 101  --   CO2 23  --   GLUCOSE 117*  --   BUN 11  --   CREATININE 1.25*  --   CALCIUM 8.4*  --   MG  --  1.3*   GFR: Estimated Creatinine Clearance: 67.2 mL/min (A) (by C-G formula based on SCr of 1.25 mg/dL (H)). Liver Function Tests: Recent Labs  Lab 10/05/23 0157  AST 31  ALT 31  ALKPHOS 63  BILITOT 1.3*  PROT 6.7  ALBUMIN 3.8   No results for input(s): "LIPASE", "AMYLASE" in the last 168 hours. No results for input(s): "AMMONIA" in the last 168 hours. Coagulation Profile: Recent Labs  Lab 10/05/23 0157  INR 1.0   Cardiac Enzymes: No results for input(s): "CKTOTAL", "CKMB", "CKMBINDEX", "TROPONINI" in the last 168 hours. BNP (last 3 results) No results for input(s): "PROBNP" in the last 8760 hours. HbA1C: No results for input(s): "HGBA1C" in the last 72 hours. CBG: No results for input(s): "GLUCAP" in the last 168 hours. Lipid Profile: No results for input(s): "CHOL", "HDL", "LDLCALC", "TRIG", "CHOLHDL", "LDLDIRECT" in the last 72 hours. Thyroid Function Tests: No results for input(s): "TSH", "T4TOTAL", "FREET4", "T3FREE", "THYROIDAB" in the last 72 hours. Anemia Panel: No results for input(s): "VITAMINB12", "FOLATE", "FERRITIN", "TIBC", "IRON", "RETICCTPCT" in the last 72 hours. Urine analysis:    Component Value Date/Time   COLORURINE YELLOW 08/25/2015 1155    APPEARANCEUR CLEAR 08/25/2015 1155   LABSPEC 1.013 08/25/2015 1155   PHURINE 5.5 08/25/2015 1155   GLUCOSEU NEGATIVE 08/25/2015 1155   HGBUR TRACE (A) 08/25/2015 1155   BILIRUBINUR NEGATIVE 08/25/2015 1155   KETONESUR NEGATIVE 08/25/2015 1155   PROTEINUR NEGATIVE 08/25/2015 1155   UROBILINOGEN 0.2 08/25/2015 1155   NITRITE NEGATIVE 08/25/2015 1155   LEUKOCYTESUR NEGATIVE 08/25/2015 1155    Radiological Exams on Admission: CT Angio Chest PE W and/or Wo Contrast  Result Date: 10/05/2023 CLINICAL DATA:  Coughing and emesis. Reports history of squamous cell carcinoma lower sternal area. Previous other squamous carcinoma sites right neck, upper back, left paraspinal area. Suspected pulmonary embolism. High probability. EXAM: CT ANGIOGRAPHY CHEST WITH CONTRAST TECHNIQUE: Multidetector CT imaging of the chest was performed using the standard protocol during bolus administration of intravenous contrast. Multiplanar CT image reconstructions and MIPs were obtained to evaluate the vascular anatomy. RADIATION DOSE REDUCTION: This exam was performed according to the departmental dose-optimization program which includes automated exposure control, adjustment of the mA and/or kV according to patient size and/or use of iterative reconstruction technique. CONTRAST:  75mL OMNIPAQUE IOHEXOL 350 MG/ML SOLN COMPARISON:  Portable chest today, PA Lat chest 09/30/2023. FINDINGS: Cardiovascular: The pulmonary trunk is prominent measuring 3.3 cm indicating arterial hypertension. No embolic arterial filling defects are seen. The pulmonary veins are nondistended. There are patchy calcifications in the left main, lad and right coronary arteries. The cardiac size is normal. There is no pericardial effusion. There are mild calcific plaques in the aorta and great vessels without aneurysm, stenosis or dissection. Mediastinum/Nodes: No enlarged mediastinal, hilar, or axillary lymph nodes. Thyroid gland, trachea, and  esophagus  demonstrate no significant findings. Small hiatal hernia. There are bilateral cardiophrenic angle fat pads. Lungs/Pleura: There is diffuse bronchial thickening. There are scattered subsegmental bronchial impactions in the posterior basal lower lobes, asymmetric haziness in the posterior basal left lower lobe which could be pneumonitis or atelectasis. There is mild posterior atelectasis both lungs. There are minimal paraseptal emphysematous changes in the lung apices. The lung bases are otherwise clear. There is no pleural effusion or pneumothorax. Upper Abdomen: There is a 1.5 cm hypodensity in the dome of the right lobe of the liver in segment 7, Hounsfield density is 14. This is probably a cyst but given patient's cancer history would suggest follow-up MRI without and with contrast to confirm a benign process. No acute upper abdominal findings. Mild splenomegaly with splenic AP dimension 15.3 cm. Abdominal aortic atherosclerosis. Musculoskeletal: Multilevel spinal bridging enthesopathy. Multilevel degenerative discs. No acute or significant osseous findings. No mass is seen in the chest wall. Review of the MIP images confirms the above findings. IMPRESSION: 1. No evidence of arterial filling defects. 2. Prominent pulmonary trunk indicating arterial hypertension. 3. Aortic and coronary artery atherosclerosis. 4. Diffuse bronchial thickening with scattered subsegmental bronchial impactions in the posterior basal lower lobes. Minimal emphysema both lung apices. 5. Asymmetric haziness in the posterior basal left lower lobe which could be pneumonitis or atelectasis. 6. 1.5 cm hypodensity in the dome of the right lobe of the liver in segment 7, probably a cyst but given patient's cancer history would suggest follow-up MRI without and with contrast to confirm a benign process. 7. Mild splenomegaly. 8. Small hiatal hernia. Aortic Atherosclerosis (ICD10-I70.0) and Emphysema (ICD10-J43.9). Electronically Signed   By: Almira Bar M.D.   On: 10/05/2023 05:21   DG Chest Port 1 View  Result Date: 10/05/2023 CLINICAL DATA:  Questionable sepsis - evaluate for abnormality EXAM: PORTABLE CHEST 1 VIEW COMPARISON:  09/30/2023 FINDINGS: Heart and mediastinal contours are within normal limits. No focal opacities or effusions. No acute bony abnormality. IMPRESSION: No active disease. Electronically Signed   By: Charlett Nose M.D.   On: 10/05/2023 02:38      Assessment/Plan   Principal Problem:   Influenza A   ***            ***                ***                 ***               ***               #) Hepatic hypodensity: On today CTA chest, there was noted to 1.5 cm hypodensity in the dome of the right hepatic lobe.  Per radiology read, this was felt to most likely represent a cyst, but this read also included recommended follow-up MRI with and without contrast to confirm a benign process.  Plan: Will convey radiology's recommendation for a follow-up MRI with and without contrast, as above.             ***                ***               ***               ***               ***               ***              ***          ***  DVT prophylaxis: SCD's ***  Code Status: Full code*** Family Communication: none*** Disposition Plan: Per Rounding Team Consults called: none***;  Admission status: ***     I SPENT GREATER THAN 75 *** MINUTES IN CLINICAL CARE TIME/MEDICAL DECISION-MAKING IN COMPLETING THIS ADMISSION.      Chaney Born Allen Basista DO [facility]  From 7PM - 7AM   10/05/2023, 10:39 PM   ***

## 2023-10-05 NOTE — ED Notes (Signed)
Cardizem stopped by MD order due to decreased BP . Patient  stable HR 120-130 A Fib

## 2023-10-05 NOTE — Plan of Care (Signed)
Plan of Care Note for accepted transfer  Patient: Cody [name]              WUJ:[id number]  DOA: 10/05/2023     Facility [facility] transfer[facility] emergency pertinent Requesting Provider: Dr. Clayborne Dana   Facility course: 77 year old man history of essential hypertension, hyperlipidemia, OSA on CPAP, chronic osteoarthritis, GERD, generalized anxiety disorder, right-sided hip arthroplasty 04/13/2016 presented to emergency department evaluation for cough fever, cough, posttussive emesis.  At presentation to ED patient become hypotensive blood pressure dropped to 95/53, tachycardic heart rate in between 117-140, tachypnea 24 however maintaining O2 sat 94% room air.  EKG shows supraventricular tachycardia heart rate 150.  Elevated lactic acid lactic acid improved 2.1-1.8 CMP showing hyponatremia 132, hypokalemia 3.2, elevated creatinine 1.25, elevated bilirubin 1.3. CT unremarkable. Respiratory panel positive with flu. Blood cultures are in process.  Chest x-ray active disease process.  CT angiogram chest: IMPRESSION: 1. No evidence of arterial filling defects. 2. Prominent pulmonary trunk indicating arterial hypertension. 3. Aortic and coronary artery atherosclerosis. 4. Diffuse bronchial thickening with scattered subsegmental bronchial impactions in the posterior basal lower lobes. Minimal emphysema both lung apices. 5. Asymmetric haziness in the posterior basal left lower lobe which could be pneumonitis or atelectasis. 6. 1.5 cm hypodensity in the dome of the right lobe of the liver in segment 7, probably a cyst but given patient's cancer history would suggest follow-up MRI without and with contrast to confirm a benign process. 7. Mild splenomegaly. 8. Small hiatal hernia.   Called Dr. Clayborne Dana stated that patient having A-fib RVR and he is holding the consult for admission for now first need to stabilize the patient and we will reach out to patient placement  again for reconsult to [facility].  Plan of care: Deferring admission for now until we reached a call back from Dr. Clayborne Dana.  Check [email or web address] for on-call coverage.  [facility] will assume care on arrival to accepting facility. Until arrival, medical decision making responsibilities remain with the EDP.  However, [facility] available 24/7 for questions and assistance.   Nursing staff please page [facility] Admits and Consults [phone]) as soon as the patient arrives to the hospital.    Author: Tereasa Coop, MD  10/05/2023  [facility]

## 2023-10-05 NOTE — ED Provider Notes (Signed)
7:24 AM Assumed care of patient from off-going team. For more details, please see note from same day.  In brief, this is a 77 y.o. male with influenza A presented with f/c, cough, post-tussive emesis, SOB, hypoxia who during stay in ED developed new onset atrial fibrillation. Did receive albuterol and had mildly decreased Ca and K so those have been supplemented. S/p one diltiazem push but rates still in 120s-140s. Had some soft BPs which improved w/ fluids.  Plan/Dispo at time of sign-out & ED Course since sign-out: [ ]  admit after stabilization on dilt gtt  BP 104/65   Pulse (!) 117   Temp 98.1 F (36.7 C) (Oral)   Resp 20   Ht 6' (1.829 m)   Wt 116.1 kg   SpO2 94%   BMI 34.72 kg/m    ED Course:   Clinical Course as of 10/05/23 1048  Wed Oct 05, 2023  9604 Patient persistently hypotensive with diltiazem despite total fluid resuscitation with 3 L.  Blood pressure 88/62 on only 5 mg/h diltiazem drip.  Heart rate still in the 130s beats per minute.  Patient states he feels okay, does not feel lightheaded or have any chest pain.  Believe that patient at this point is a poor sedation candidate for cardioversion and will instead begin amiodarone bolus and infusion. [HN]  1010 HR now 100s-120s on amiodarone. BP 109/70.  [HN]  1047 Admitted to hospitalist Dr. Katrinka Blazing  [HN]    Clinical Course User Index [HN] Loetta Rough, MD   ------------------------------- Vivi Barrack, MD Emergency Medicine  This note was created using dictation software, which may contain spelling or grammatical errors.  CRITICAL CARE Performed by: Loetta Rough Total critical care time: 35 minutes Critical care time was exclusive of separately billable procedures and treating other patients. Critical care was necessary to treat or prevent imminent or life-threatening deterioration: circulatory failure. Critical care was time spent personally by me on the following activities: development of treatment plan  with patient and/or surrogate as well as nursing, discussions with consultants, evaluation of patient's response to treatment, examination of patient, obtaining history from patient or surrogate, ordering and performing treatments and interventions, ordering and review of laboratory studies, ordering and review of radiographic studies, pulse oximetry and re-evaluation of patient's condition.    Loetta Rough, MD 10/05/23 1048

## 2023-10-05 NOTE — Sepsis Progress Note (Signed)
Elink monitoring for the code sepsis protocol.  

## 2023-10-05 NOTE — Sepsis Progress Note (Signed)
Verified with bedside RN via secure chat that blood cx were collected & sent to lab.  Still not showing as "in process" by lab.

## 2023-10-05 NOTE — ED Triage Notes (Signed)
Pt just finished meds for bronchitis and is feeling worse. Pt has cough that causes his chest to hurt. Chest only hurts when coughing. Pt had emesis x1. Pt also feels feverish but has not taken temp

## 2023-10-05 NOTE — ED Provider Notes (Signed)
[name] Provider Note   CSN: [id number] Arrival date & time: 10/05/23  0132     History  Chief Complaint  Patient presents with   Cough   Emesis    Cody [name] IV is a 77 y.o. male.  77 yo M here with fever, cough, post tussive emesis. Dx w/ bronchitis on the sixth. Started pred/albuterol and was doing better but the last couple days started having worsening cough and shortness of breath.  No lower extremity swelling.  Fever today at Tylenol and the fever came back and patient was breathing however he felt worse so came to the ER for evaluation.  Had some emesis today as well.  No diarrhea did not get a flu shot this year.  Has had pneumonia shots in the past but is unsure how many. No known sick contacts.    Cough Emesis Associated symptoms: cough        Home Medications Prior to Admission medications   Medication Sig Start Date End Date Taking? Authorizing Provider  albuterol (VENTOLIN HFA) 108 (90 Base) MCG/ACT inhaler Inhale 1-2 puffs into the lungs every 6 (six) hours as needed for wheezing or shortness of breath. 09/30/23   Cody Bamberg, PA-C  atorvastatin (LIPITOR) 20 MG tablet Take 1 tablet (20 mg total) by mouth daily after supper. 07/25/23   Cody Mast, MD  azelastine (ASTELIN) 0.1 % nasal spray Place into both nostrils 2 (two) times daily. Use in each nostril as directed    [provider]  fluticasone (FLONASE) 50 MCG/ACT nasal spray Place 2 sprays into both nostrils daily as needed for allergies.     [provider]  guaiFENesin (MUCINEX) 600 MG 12 hr tablet Take by mouth 2 (two) times daily.    [provider]  ibuprofen (ADVIL,MOTRIN) 200 MG tablet Take 200 mg by mouth every 6 (six) hours as needed.    [provider]  loratadine (CLARITIN) 10 MG tablet Take 10 mg by mouth daily.    [provider]  losartan-hydrochlorothiazide (HYZAAR) 50-12.5 MG tablet Take 1  tablet by mouth daily. 10/04/23   Cody Mast, MD  omeprazole (PRILOSEC) 20 MG capsule Take 1 capsule (20 mg total) by mouth daily. 07/25/23   Cody Mast, MD  Phenylephrine-Acetaminophen (TYLENOL SINUS CONGESTION/PAIN PO) Take by mouth as needed.    [provider]  polyethylene glycol (MIRALAX / GLYCOLAX) 17 g packet Take 17 g by mouth daily.    [provider]  predniSONE (DELTASONE) 10 MG tablet Take 3 tablets (30 mg total) by mouth daily with breakfast. 09/30/23   Cody Bamberg, PA-C  promethazine-dextromethorphan (PROMETHAZINE-DM) 6.25-15 MG/5ML syrup Take 5 mLs by mouth 3 (three) times daily as needed for cough. 09/30/23   Cody Bamberg, PA-C  Wheat Dextrin (BENEFIBER DRINK MIX PO) Take by mouth.    [provider]      Allergies    Codeine    Review of Systems   Review of Systems  Respiratory:  Positive for cough.   Gastrointestinal:  Positive for vomiting.    Physical Exam Updated Vital Signs BP 102/69   Pulse (!) 147   Temp 98.1 F (36.7 C) (Oral)   Resp 13   Ht 6' (1.829 m)   Wt 116.1 kg   SpO2 95%   BMI 34.72 kg/m  Physical Exam Vitals and nursing note reviewed.  Constitutional:      Appearance: He is well-developed.  HENT:     Head: Normocephalic and atraumatic.     Mouth/Throat:     Mouth: Mucous membranes are dry.  Cardiovascular:     Rate and Rhythm: Regular rhythm. Tachycardia present.  Pulmonary:     Effort: Pulmonary effort is normal. Tachypnea present. No respiratory distress.     Breath sounds: Decreased air movement present. Rhonchi (left upper lobe) and rales (bilateral lower lobes) present.     Comments: Pulse ox as low as 92 Abdominal:     General: There is no distension.  Musculoskeletal:        General: Normal range of motion.     Cervical back: Normal range of motion.  Neurological:     General: No focal deficit present.     Mental Status: He is alert.    ED Results / Procedures / Treatments   Labs (all  labs ordered are listed, but only abnormal results are displayed) Labs Reviewed  RESP PANEL BY RT-PCR (RSV, FLU A&B, COVID)  RVPGX2 - Abnormal; Notable for the following components:      Result Value   Influenza A by PCR POSITIVE (*)    All other components within normal limits  LACTIC ACID, PLASMA - Abnormal; Notable for the following components:   Lactic Acid, Venous 2.1 (*)    All other components within normal limits  COMPREHENSIVE METABOLIC PANEL - Abnormal; Notable for the following components:   Sodium 132 (*)    Potassium 3.2 (*)    Glucose, Bld 117 (*)    Creatinine, Ser 1.25 (*)    Calcium 8.4 (*)    Total Bilirubin 1.3 (*)    All other components within normal limits  CBC WITH DIFFERENTIAL/PLATELET - Abnormal; Notable for the following components:   Neutro Abs 8.6 (*)    Lymphs Abs 0.1 (*)    All other components within normal limits  CULTURE, BLOOD (ROUTINE X 2)  CULTURE, BLOOD (ROUTINE X 2)  LACTIC ACID, PLASMA  PROTIME-INR  APTT  URINALYSIS, W/ REFLEX TO CULTURE (INFECTION SUSPECTED)  MAGNESIUM    EKG None  Radiology CT Angio Chest PE W and/or Wo Contrast  Result Date: 10/05/2023 CLINICAL DATA:  Coughing and emesis. Reports history of squamous cell carcinoma lower sternal area. Previous other squamous carcinoma sites right neck, upper back, left paraspinal area. Suspected pulmonary embolism. High probability. EXAM: CT ANGIOGRAPHY CHEST WITH CONTRAST TECHNIQUE: Multidetector CT imaging of the chest was performed using the standard protocol during bolus administration of intravenous contrast. Multiplanar CT image reconstructions and MIPs were obtained to evaluate the vascular anatomy. RADIATION DOSE REDUCTION: This exam was performed according to the departmental dose-optimization program which includes automated exposure control, adjustment of the mA and/or kV according to patient size and/or use of iterative reconstruction technique. CONTRAST:  75mL OMNIPAQUE  IOHEXOL 350 MG/ML SOLN COMPARISON:  Portable chest today, PA Lat chest 09/30/2023. FINDINGS: Cardiovascular: The pulmonary trunk is prominent measuring 3.3 cm indicating arterial hypertension. No embolic arterial filling defects are seen. The pulmonary veins are nondistended. There are patchy calcifications in the left main, lad and right coronary arteries. The cardiac size is normal. There is no pericardial effusion. There are mild calcific plaques in the aorta and great vessels without aneurysm, stenosis or dissection. Mediastinum/Nodes: No enlarged mediastinal, hilar, or axillary lymph nodes. Thyroid gland, trachea, and esophagus demonstrate no significant findings. Small hiatal hernia. There are bilateral cardiophrenic angle fat pads. Lungs/Pleura: There is diffuse bronchial thickening. There are scattered subsegmental bronchial impactions in the  posterior basal lower lobes, asymmetric haziness in the posterior basal left lower lobe which could be pneumonitis or atelectasis. There is mild posterior atelectasis both lungs. There are minimal paraseptal emphysematous changes in the lung apices. The lung bases are otherwise clear. There is no pleural effusion or pneumothorax. Upper Abdomen: There is a 1.5 cm hypodensity in the dome of the right lobe of the liver in segment 7, Hounsfield density is 14. This is probably a cyst but given patient's cancer history would suggest follow-up MRI without and with contrast to confirm a benign process. No acute upper abdominal findings. Mild splenomegaly with splenic AP dimension 15.3 cm. Abdominal aortic atherosclerosis. Musculoskeletal: Multilevel spinal bridging enthesopathy. Multilevel degenerative discs. No acute or significant osseous findings. No mass is seen in the chest wall. Review of the MIP images confirms the above findings. IMPRESSION: 1. No evidence of arterial filling defects. 2. Prominent pulmonary trunk indicating arterial hypertension. 3. Aortic and coronary  artery atherosclerosis. 4. Diffuse bronchial thickening with scattered subsegmental bronchial impactions in the posterior basal lower lobes. Minimal emphysema both lung apices. 5. Asymmetric haziness in the posterior basal left lower lobe which could be pneumonitis or atelectasis. 6. 1.5 cm hypodensity in the dome of the right lobe of the liver in segment 7, probably a cyst but given patient's cancer history would suggest follow-up MRI without and with contrast to confirm a benign process. 7. Mild splenomegaly. 8. Small hiatal hernia. Aortic Atherosclerosis (ICD10-I70.0) and Emphysema (ICD10-J43.9). Electronically Signed   By: Almira Bar M.D.   On: 10/05/2023 05:21   DG Chest Port 1 View  Result Date: 10/05/2023 CLINICAL DATA:  Questionable sepsis - evaluate for abnormality EXAM: PORTABLE CHEST 1 VIEW COMPARISON:  09/30/2023 FINDINGS: Heart and mediastinal contours are within normal limits. No focal opacities or effusions. No acute bony abnormality. IMPRESSION: No active disease. Electronically Signed   By: Charlett Nose M.D.   On: 10/05/2023 02:38    Procedures .Critical Care  Performed by: Marily Memos, MD Authorized by: Marily Memos, MD   Critical care provider statement:    Critical care time (minutes):  76   Critical care was necessary to treat or prevent imminent or life-threatening deterioration of the following conditions:  Sepsis   Critical care was time spent personally by me on the following activities:  Development of treatment plan with patient or surrogate, discussions with consultants, evaluation of patient's response to treatment, examination of patient, ordering and review of laboratory studies, ordering and review of radiographic studies, ordering and performing treatments and interventions, pulse oximetry, re-evaluation of patient's condition and review of old charts     Medications Ordered in ED Medications  cefTRIAXone (ROCEPHIN) 2 g in sodium chloride 0.9 % 100 mL IVPB  (0 g Intravenous Stopped 10/05/23 0300)  azithromycin (ZITHROMAX) 500 mg in sodium chloride 0.9 % 250 mL IVPB (0 mg Intravenous Stopped 10/05/23 0547)  acetaminophen (TYLENOL) tablet 650 mg (650 mg Oral Given 10/05/23 0211)  ipratropium-albuterol (DUONEB) 0.5-2.5 (3) MG/3ML nebulizer solution 3 mL (3 mLs Nebulization Given 10/05/23 0519)  diltiazem (CARDIZEM) injection 15 mg (has no administration in time range)  potassium chloride 10 mEq in 100 mL IVPB (has no administration in time range)  potassium chloride SA (KLOR-CON M) CR tablet 40 mEq (has no administration in time range)  magnesium sulfate IVPB 2 g 50 mL (has no administration in time range)  calcium gluconate 1 g/ 50 mL sodium chloride IVPB (has no administration in time range)  lactated ringers bolus  1,000 mL (0 mLs Intravenous Stopped 10/05/23 0344)  oseltamivir (TAMIFLU) capsule 75 mg (75 mg Oral Given 10/05/23 0419)  ibuprofen (ADVIL) tablet 800 mg (800 mg Oral Given 10/05/23 0419)  lactated ringers bolus 1,000 mL (1,000 mLs Intravenous New Bag/Given 10/05/23 0546)  iohexol (OMNIPAQUE) 350 MG/ML injection 75 mL (75 mLs Intravenous Contrast Given 10/05/23 0436)    ED Course/ Medical Decision Making/ A&P Clinical Course as of 10/07/23 0031  Wed Oct 05, 2023  0853 Patient persistently hypotensive with diltiazem despite total fluid resuscitation with 3 L.  Blood pressure 88/62 on only 5 mg/h diltiazem drip.  Heart rate still in the 130s beats per minute.  Patient states he feels okay, does not feel lightheaded or have any chest pain.  Believe that patient at this [name] is a poor sedation candidate for cardioversion and will instead begin amiodarone bolus and infusion. [HN]  1010 HR now 100s-120s on amiodarone. BP 109/70.  [HN]  1047 Admitted to hospitalist Dr. Katrinka Blazing  [HN]    Clinical Course User Index [HN] Loetta Rough, MD                                 Medical Decision Making 77 year old male presents the ER today with  abnormal lung sounds, mild hypoxia, fever, tachycardia and tachypnea concerning for community-acquired pneumonia.  Code sepsis alert initiated.  Antibiotics for commune acquired pneumonia started.  Fluids started secondary to tachycardia and clinical dryness with recent emesis.  Chest x-ray viewed by myself and does have a couple opacities but they do appear to be present on his most recent 4 days ago and radiology does not note any acute changes, will CT chest for PE/lung architecture.  White count is within normal limits.  Mild elevation of his creatinine over his baseline.  Rest of labs still pending.  Will continue to observe for improvement.  Leaning towards admission at this [name] but if he has significant turnaround and is feeling much better may consider discharge with close PCP follow-up.  Amount and/or Complexity of Data Reviewed Independent Historian: spouse    Details: Gave tylenol Seen his cough and breathing getting worse External Data Reviewed: radiology and notes.    Details: From UC visit 4 days prior Labs: ordered. Radiology: ordered and independent interpretation performed. ECG/medicine tests: ordered and independent interpretation performed.  Risk OTC drugs. Prescription drug management. Decision regarding hospitalization.   Flu A positive. Tamiflu provided as his cough/fever started within last 48 hours, age, medical problems and severity warranted it. Received multiple liters of fluids, defervesced and still tachycardic, tachpnic and relative hypoxia. Duoneb given which helped his subjective dyspnea. Around 0620 patient went into afib rvr. No h/o same. BP stable. No change in breathing. Patient not aware. Repeat ecg as above. Reviewing his labs did have mild low K, will replete with mag and check same, maybe the albuterol shifted the K and caused it? Also with mildly low Ca, will replete as well with diltiazem bolus and see how he responds. I'd be pretty hesitant to sedate him  for cardioversion with the persistent tachycardia he has had, borderling hypxia, dyspnea and influenza so will make every attempt at pharmaceutical treatments first.  [facility] consult. Care transferred pending stability and admit.   Final Clinical Impression(s) / ED Diagnoses Final diagnoses:  Influenza  Sepsis, due to unspecified organism, unspecified whether acute organ dysfunction present (HCC)  Tachycardia    Rx /  DC Orders ED Discharge Orders     None         Lemoyne Nestor, Barbara Cower, MD 10/07/23 [id number]

## 2023-10-05 NOTE — ED Notes (Addendum)
Patient's HR increased with rhythm change. Provider at bedside and repeat EKG completed. Patient denies any new shortness of breath, chest pain, weakness. Patient is AAOx4, GCS 15.

## 2023-10-06 ENCOUNTER — Encounter (HOSPITAL_COMMUNITY): Payer: Self-pay | Admitting: Internal Medicine

## 2023-10-06 ENCOUNTER — Other Ambulatory Visit (HOSPITAL_COMMUNITY): Payer: Self-pay

## 2023-10-06 DIAGNOSIS — K219 Gastro-esophageal reflux disease without esophagitis: Secondary | ICD-10-CM | POA: Diagnosis not present

## 2023-10-06 DIAGNOSIS — I10 Essential (primary) hypertension: Secondary | ICD-10-CM

## 2023-10-06 DIAGNOSIS — E876 Hypokalemia: Secondary | ICD-10-CM | POA: Diagnosis present

## 2023-10-06 DIAGNOSIS — R6520 Severe sepsis without septic shock: Secondary | ICD-10-CM | POA: Diagnosis present

## 2023-10-06 DIAGNOSIS — I480 Paroxysmal atrial fibrillation: Secondary | ICD-10-CM

## 2023-10-06 DIAGNOSIS — I4891 Unspecified atrial fibrillation: Secondary | ICD-10-CM | POA: Diagnosis not present

## 2023-10-06 DIAGNOSIS — E782 Mixed hyperlipidemia: Secondary | ICD-10-CM | POA: Diagnosis not present

## 2023-10-06 DIAGNOSIS — A419 Sepsis, unspecified organism: Secondary | ICD-10-CM | POA: Diagnosis present

## 2023-10-06 DIAGNOSIS — R509 Fever, unspecified: Secondary | ICD-10-CM | POA: Diagnosis present

## 2023-10-06 LAB — COMPREHENSIVE METABOLIC PANEL
ALT: 25 U/L (ref 0–44)
AST: 31 U/L (ref 15–41)
Albumin: 2.9 g/dL — ABNORMAL LOW (ref 3.5–5.0)
Alkaline Phosphatase: 44 U/L (ref 38–126)
Anion gap: 11 (ref 5–15)
BUN: 11 mg/dL (ref 8–23)
CO2: 20 mmol/L — ABNORMAL LOW (ref 22–32)
Calcium: 8.5 mg/dL — ABNORMAL LOW (ref 8.9–10.3)
Chloride: 105 mmol/L (ref 98–111)
Creatinine, Ser: 1.31 mg/dL — ABNORMAL HIGH (ref 0.61–1.24)
GFR, Estimated: 57 mL/min — ABNORMAL LOW (ref >60)
Glucose, Bld: 111 mg/dL — ABNORMAL HIGH (ref 70–99)
Potassium: 3.6 mmol/L (ref 3.5–5.1)
Sodium: 136 mmol/L (ref 135–145)
Total Bilirubin: 1.2 mg/dL — ABNORMAL HIGH (ref <1.2)
Total Protein: 5.5 g/dL — ABNORMAL LOW (ref 6.5–8.1)

## 2023-10-06 LAB — CBC WITH DIFFERENTIAL/PLATELET
Abs Immature Granulocytes: 0.05 10*3/uL (ref 0.00–0.07)
Basophils Absolute: 0 10*3/uL (ref 0.0–0.1)
Basophils Relative: 0 %
Eosinophils Absolute: 0.4 10*3/uL (ref 0.0–0.5)
Eosinophils Relative: 5 %
HCT: 37.1 % — ABNORMAL LOW (ref 39.0–52.0)
Hemoglobin: 13.1 g/dL (ref 13.0–17.0)
Immature Granulocytes: 1 %
Lymphocytes Relative: 7 %
Lymphs Abs: 0.6 10*3/uL — ABNORMAL LOW (ref 0.7–4.0)
MCH: 29.5 pg (ref 26.0–34.0)
MCHC: 35.3 g/dL (ref 30.0–36.0)
MCV: 83.6 fL (ref 80.0–100.0)
Monocytes Absolute: 0.5 10*3/uL (ref 0.1–1.0)
Monocytes Relative: 6 %
Neutro Abs: 7.4 10*3/uL (ref 1.7–7.7)
Neutrophils Relative %: 81 %
Platelets: 151 10*3/uL (ref 150–400)
RBC: 4.44 MIL/uL (ref 4.22–5.81)
RDW: 13.8 % (ref 11.5–15.5)
WBC: 8.9 10*3/uL (ref 4.0–10.5)
nRBC: 0 % (ref 0.0–0.2)

## 2023-10-06 LAB — TSH: TSH: 2.774 u[IU]/mL (ref 0.350–4.500)

## 2023-10-06 LAB — PHOSPHORUS: Phosphorus: 2.1 mg/dL — ABNORMAL LOW (ref 2.5–4.6)

## 2023-10-06 LAB — MAGNESIUM: Magnesium: 1.9 mg/dL (ref 1.7–2.4)

## 2023-10-06 MED ORDER — BENZONATATE 100 MG PO CAPS
100.0000 mg | ORAL_CAPSULE | Freq: Three times a day (TID) | ORAL | 0 refills | Status: DC | PRN
  Filled 2023-10-06: qty 30, 10d supply, fill #0

## 2023-10-06 MED ORDER — NAPHAZOLINE-GLYCERIN 0.012-0.25 % OP SOLN: 1.0000 [drp] | Freq: Four times a day (QID) | OPHTHALMIC | Status: DC | PRN

## 2023-10-06 MED ORDER — PANTOPRAZOLE SODIUM 40 MG PO TBEC
40.0000 mg | DELAYED_RELEASE_TABLET | Freq: Every day | ORAL | Status: DC
  Administered 2023-10-06: 40 mg via ORAL
  Filled 2023-10-06: qty 1

## 2023-10-06 MED ORDER — METOPROLOL TARTRATE 25 MG PO TABS
25.0000 mg | ORAL_TABLET | Freq: Two times a day (BID) | ORAL | 1 refills | Status: DC
  Filled 2023-10-06: qty 60, 30d supply, fill #0

## 2023-10-06 MED ORDER — METOPROLOL TARTRATE 12.5 MG HALF TABLET
12.5000 mg | ORAL_TABLET | Freq: Two times a day (BID) | ORAL | Status: DC
  Administered 2023-10-06: 12.5 mg via ORAL
  Filled 2023-10-06: qty 1

## 2023-10-06 MED ORDER — AMIODARONE HCL 200 MG PO TABS
400.0000 mg | ORAL_TABLET | Freq: Two times a day (BID) | ORAL | Status: DC
  Administered 2023-10-06: 400 mg via ORAL
  Filled 2023-10-06: qty 2

## 2023-10-06 MED ORDER — AMIODARONE HCL 200 MG PO TABS: 200.0000 mg | ORAL_TABLET | Freq: Every day | ORAL | Status: DC

## 2023-10-06 MED ORDER — BENZONATATE 200 MG PO CAPS
200.0000 mg | ORAL_CAPSULE | Freq: Three times a day (TID) | ORAL | 0 refills | Status: DC | PRN
  Filled 2023-10-06: qty 20, 7d supply, fill #0

## 2023-10-06 MED ORDER — GUAIFENESIN ER 600 MG PO TB12
600.0000 mg | ORAL_TABLET | Freq: Two times a day (BID) | ORAL | Status: DC
  Administered 2023-10-06 (×2): 600 mg via ORAL
  Filled 2023-10-06 (×2): qty 1

## 2023-10-06 MED ORDER — APIXABAN 5 MG PO TABS
5.0000 mg | ORAL_TABLET | Freq: Two times a day (BID) | ORAL | Status: DC
  Administered 2023-10-06: 5 mg via ORAL
  Filled 2023-10-06: qty 1

## 2023-10-06 MED ORDER — APIXABAN 5 MG PO TABS
5.0000 mg | ORAL_TABLET | Freq: Two times a day (BID) | ORAL | 1 refills | Status: DC
  Filled 2023-10-06: qty 60, 30d supply, fill #0

## 2023-10-06 MED ORDER — ATORVASTATIN CALCIUM 10 MG PO TABS: 20.0000 mg | ORAL_TABLET | Freq: Every day | ORAL | Status: DC

## 2023-10-06 MED ORDER — AMIODARONE HCL 200 MG PO TABS
ORAL_TABLET | ORAL | 1 refills | Status: DC
  Filled 2023-10-06: qty 60, 30d supply, fill #0

## 2023-10-06 MED ORDER — BENZONATATE 100 MG PO CAPS
200.0000 mg | ORAL_CAPSULE | Freq: Three times a day (TID) | ORAL | Status: DC | PRN
  Administered 2023-10-06: 200 mg via ORAL
  Filled 2023-10-06: qty 2

## 2023-10-06 MED ORDER — POLYVINYL ALCOHOL 1.4 % OP SOLN
1.0000 [drp] | OPHTHALMIC | Status: DC | PRN
  Administered 2023-10-06: 1 [drp] via OPHTHALMIC
  Filled 2023-10-06: qty 15

## 2023-10-06 NOTE — Discharge Summary (Addendum)
PATIENT DETAILS Name: Cody [name] Age: 77 y.o. Sex: male Date of Birth: July 16, 1946 MRN: [id number]. Admitting Physician: Clydie Braun, MD JXB:JYNW, Bertram Millard, MD  Admit Date: 10/05/2023 Discharge date: 10/06/2023  Recommendations for Outpatient Follow-up:  Follow up with PCP in 1-2 weeks Please obtain CMP/CBC in one week Please ensure follow-up with cardiology. Outpatient liver MRI to assess hypodensity seen on CTA chest  Admitted From:  Home  Disposition: Home   Discharge Condition: good  CODE STATUS:   Code Status: Full Code   Diet recommendation:  Diet Order             Diet - low sodium heart healthy           Diet regular Room service appropriate? Yes; Fluid consistency: Thin  Diet effective now                    Brief Summary: 77 year old with history of HLD, OSA on CPAP who started having cough/URI symptoms/fever/myalgia times week-subsequently presented to med [facility] for just not feeling well-he was found to have A-fib RVR.  He was started on [name] Cardizem-blood pressure became soft-he was then transition to amiodarone infusion and subsequently transferred to [facility] for cardiology evaluation.  Brief Hospital Course: Acute bronchitis secondary to influenza Some cough-but benign exam-no wheezing Will be prescribed antitussives on discharge Given lack of any significant findings on lung exam-do not think he requires steroids or bronchodilators.  He is beyond the timeframe for Tamiflu.  Apparently he has already completed a course of steroids as well.  PAF with RVR Triggered by respiratory illness Quickly converted to sinus rhythm-has been in sinus rhythm since getting into [facility]. Evaluated by cardiology-patient very anxious to be discharged home today-echocardiogram is planned in the outpatient setting-he will continue with oral amiodarone/Eliquis on discharge. TSH was stable.  Hypodensity on right dome of liver Outpatient MRI  recommended to confirm benign process (likely a cyst per radiology report)  HTN Previously on losartan/HCTZ-has been switched to metoprolol-which has been continued on discharge.  HLD Statin  OSA CPAP  GERD PPI   Discharge Diagnoses:  Principal Problem:   Atrial fibrillation with rapid ventricular response (HCC) Active Problems:   HLD (hyperlipidemia)   OSA (obstructive sleep apnea)   GERD (gastroesophageal reflux disease)   Influenza A   Severe sepsis (HCC)   Fever   Hypomagnesemia   Hypokalemia   Discharge Instructions:  Activity:  As tolerated  Discharge Instructions     Call MD for:  difficulty breathing, headache or visual disturbances   Complete by: As directed    Diet - low sodium heart healthy   Complete by: As directed    Discharge instructions   Complete by: As directed    Follow with Primary MD  Loyola Mast, MD in 1-2 weeks  Follow-up with cardiology-the office will call with a follow-up appointment  Follow-up with cardiology for an echocardiogram  Incidental finding-you have found to have a probable cyst on the right dome of the liver-please ask your primary care practitioner to order a MRI of your liver to make sure this is a benign process.  Please get a complete blood count and chemistry panel checked by your Primary MD at your next visit, and again as instructed by your Primary MD.  Get Medicines reviewed and adjusted: Please take all your medications with you for your next visit with your Primary MD  Laboratory/radiological data: Please request your Primary MD  to go over all hospital tests and procedure/radiological results at the follow up, please ask your Primary MD to get all Hospital records sent to his/her office.  In some cases, they will be blood work, cultures and biopsy results pending at the time of your discharge. Please request that your primary care M.D. follows up on these results.  Also Note the following: If you  experience worsening of your admission symptoms, develop shortness of breath, life threatening emergency, suicidal or homicidal thoughts you must seek medical attention immediately by calling 911 or calling your MD immediately  if symptoms less severe.  You must read complete instructions/literature along with all the possible adverse reactions/side effects for all the Medicines you take and that have been prescribed to you. Take any new Medicines after you have completely understood and accpet all the possible adverse reactions/side effects.   Do not drive when taking Pain medications or sleeping medications (Benzodaizepines)  Do not take more than prescribed Pain, Sleep and Anxiety Medications. It is not advisable to combine anxiety,sleep and pain medications without talking with your primary care practitioner  Special Instructions: If you have smoked or chewed Tobacco  in the last 2 yrs please stop smoking, stop any regular Alcohol  and or any Recreational drug use.  Wear Seat belts while driving.  Please note: You were cared for by a hospitalist during your hospital stay. Once you are discharged, your primary care physician will handle any further medical issues. Please note that NO REFILLS for any discharge medications will be authorized once you are discharged, as it is imperative that you return to your primary care physician (or establish a relationship with a primary care physician if you do not have one) for your post hospital discharge needs so that they can reassess your need for medications and monitor your lab values.   Increase activity slowly   Complete by: As directed       Allergies as of 10/06/2023       Reactions   Codeine Nausea And Vomiting, Other (See Comments)   "Can't take in large doses"        Medication List     STOP taking these medications    ibuprofen 200 MG tablet Commonly known as: ADVIL   losartan-hydrochlorothiazide 50-12.5 MG tablet Commonly  known as: HYZAAR   predniSONE 10 MG tablet Commonly known as: DELTASONE       TAKE these medications    albuterol 108 (90 Base) MCG/ACT inhaler Commonly known as: VENTOLIN HFA Inhale 1-2 puffs into the lungs every 6 (six) hours as needed for wheezing or shortness of breath.   amiodarone 200 MG tablet Commonly known as: Pacerone 400 mg p.o. twice daily x 10 days, followed by 200 mg p.o. daily   apixaban 5 MG Tabs tablet Commonly known as: ELIQUIS Take 1 tablet (5 mg total) by mouth 2 (two) times daily.   atorvastatin 20 MG tablet Commonly known as: LIPITOR Take 1 tablet (20 mg total) by mouth daily after supper.   azelastine 0.1 % nasal spray Commonly known as: ASTELIN Place into both nostrils 2 (two) times daily. Use in each nostril as directed   BENEFIBER DRINK MIX PO Take 1 Dose by mouth daily.   benzonatate 100 MG capsule Commonly known as: Tessalon Perles Take 1 capsule (100 mg total) by mouth 3 (three) times daily as needed for cough.   benzonatate 200 MG capsule Commonly known as: TESSALON Take 1 capsule (200 mg total) by mouth  3 (three) times daily as needed for cough.   fluticasone 50 MCG/ACT nasal spray Commonly known as: FLONASE Place 2 sprays into both nostrils daily as needed for allergies.   guaiFENesin 600 MG 12 hr tablet Commonly known as: MUCINEX Take 600 mg by mouth as needed for cough or to loosen phlegm.   loratadine 10 MG tablet Commonly known as: CLARITIN Take 10 mg by mouth daily as needed for allergies.   metoprolol tartrate 25 MG tablet Commonly known as: LOPRESSOR Take 1 tablet (25 mg total) by mouth 2 (two) times daily.   omeprazole 20 MG capsule Commonly known as: PRILOSEC Take 1 capsule (20 mg total) by mouth daily.   polyethylene glycol 17 g packet Commonly known as: MIRALAX / GLYCOLAX Take 17 g by mouth daily.   promethazine-dextromethorphan 6.25-15 MG/5ML syrup Commonly known as: PROMETHAZINE-DM Take 5 mLs by mouth 3  (three) times daily as needed for cough.   TYLENOL SINUS CONGESTION/PAIN PO Take 1 capsule by mouth as needed (sinus).        Follow-up Information     Loyola Mast, MD. Schedule an appointment as soon as possible for a visit in 1 week(s).   Specialty: Family Medicine Contact information: [address] [place] [address] [phone]         [facility] Office Follow up.   Specialty: Cardiology Why: Office will call with date/time, If you dont hear from them,please give them a call Contact information: [address], [address] [place] [address] [phone]               Allergies  Allergen Reactions   Codeine Nausea And Vomiting and Other (See Comments)    "Can't take in large doses"     Other Procedures/Studies: CT Angio Chest PE W and/or Wo Contrast Result Date: 10/05/2023 CLINICAL DATA:  Coughing and emesis. Reports history of squamous cell carcinoma lower sternal area. Previous other squamous carcinoma sites right neck, upper back, left paraspinal area. Suspected pulmonary embolism. High probability. EXAM: CT ANGIOGRAPHY CHEST WITH CONTRAST TECHNIQUE: Multidetector CT imaging of the chest was performed using the standard protocol during bolus administration of intravenous contrast. Multiplanar CT image reconstructions and MIPs were obtained to evaluate the vascular anatomy. RADIATION DOSE REDUCTION: This exam was performed according to the departmental dose-optimization program which includes automated exposure control, adjustment of the mA and/or kV according to patient size and/or use of iterative reconstruction technique. CONTRAST:  75mL OMNIPAQUE IOHEXOL 350 MG/ML SOLN COMPARISON:  Portable chest today, PA Lat chest 09/30/2023. FINDINGS: Cardiovascular: The pulmonary trunk is prominent measuring 3.3 cm indicating arterial hypertension. No embolic arterial filling defects are seen. The pulmonary veins are  nondistended. There are patchy calcifications in the left main, lad and right coronary arteries. The cardiac size is normal. There is no pericardial effusion. There are mild calcific plaques in the aorta and great vessels without aneurysm, stenosis or dissection. Mediastinum/Nodes: No enlarged mediastinal, hilar, or axillary lymph nodes. Thyroid gland, trachea, and esophagus demonstrate no significant findings. Small hiatal hernia. There are bilateral cardiophrenic angle fat pads. Lungs/Pleura: There is diffuse bronchial thickening. There are scattered subsegmental bronchial impactions in the posterior basal lower lobes, asymmetric haziness in the posterior basal left lower lobe which could be pneumonitis or atelectasis. There is mild posterior atelectasis both lungs. There are minimal paraseptal emphysematous changes in the lung apices. The lung bases are otherwise clear. There is no pleural effusion or pneumothorax. Upper Abdomen: There  is a 1.5 cm hypodensity in the dome of the right lobe of the liver in segment 7, Hounsfield density is 14. This is probably a cyst but given patient's cancer history would suggest follow-up MRI without and with contrast to confirm a benign process. No acute upper abdominal findings. Mild splenomegaly with splenic AP dimension 15.3 cm. Abdominal aortic atherosclerosis. Musculoskeletal: Multilevel spinal bridging enthesopathy. Multilevel degenerative discs. No acute or significant osseous findings. No mass is seen in the chest wall. Review of the MIP images confirms the above findings. IMPRESSION: 1. No evidence of arterial filling defects. 2. Prominent pulmonary trunk indicating arterial hypertension. 3. Aortic and coronary artery atherosclerosis. 4. Diffuse bronchial thickening with scattered subsegmental bronchial impactions in the posterior basal lower lobes. Minimal emphysema both lung apices. 5. Asymmetric haziness in the posterior basal left lower lobe which could be  pneumonitis or atelectasis. 6. 1.5 cm hypodensity in the dome of the right lobe of the liver in segment 7, probably a cyst but given patient's cancer history would suggest follow-up MRI without and with contrast to confirm a benign process. 7. Mild splenomegaly. 8. Small hiatal hernia. Aortic Atherosclerosis (ICD10-I70.0) and Emphysema (ICD10-J43.9). Electronically Signed   By: Almira Bar M.D.   On: 10/05/2023 05:21   DG Chest Port 1 View Result Date: 10/05/2023 CLINICAL DATA:  Questionable sepsis - evaluate for abnormality EXAM: PORTABLE CHEST 1 VIEW COMPARISON:  09/30/2023 FINDINGS: Heart and mediastinal contours are within normal limits. No focal opacities or effusions. No acute bony abnormality. IMPRESSION: No active disease. Electronically Signed   By: Charlett Nose M.D.   On: 10/05/2023 02:38   DG Chest 2 View Result Date: 09/30/2023 CLINICAL DATA:  Cough and congestion. Productive cough. Nasal congestion for 2 days. EXAM: CHEST - 2 VIEW COMPARISON:  None Available. FINDINGS: The heart size and mediastinal contours are normal. The lungs are clear. There is no pleural effusion or pneumothorax. No acute osseous findings are identified. Degenerative changes throughout the spine with paraspinal osteophytes. IMPRESSION: No evidence of acute cardiopulmonary process. Electronically Signed   By: Carey Bullocks M.D.   On: 09/30/2023 12:39     TODAY-DAY OF DISCHARGE:  Subjective:   Cody [name] today has no headache,no chest abdominal pain,no new weakness tingling or numbness, feels much better wants to go home today.   Objective:   Blood pressure (!) 110/49, pulse 78, temperature 98.2 F (36.8 C), temperature source Oral, resp. rate (!) 24, height 6' (1.829 m), weight 116.2 kg, SpO2 95%.  Intake/Output Summary (Last 24 hours) at 10/06/2023 1407 Last data filed at 10/06/2023 0906 Gross per 24 hour  Intake 802.42 ml  Output --  Net 802.42 ml   Filed Weights   10/05/23 0141 10/06/23  0500  Weight: 116.1 kg 116.2 kg    Exam: Awake Alert, Oriented *3, No new F.N deficits, Normal affect Cody [name].AT,PERRAL Supple Neck,No JVD, No cervical lymphadenopathy appriciated.  Symmetrical Chest wall movement, Good air movement bilaterally, CTAB RRR,No Gallops,Rubs or new Murmurs, No Parasternal Heave +ve B.Sounds, Abd Soft, Non tender, No organomegaly appriciated, No rebound -guarding or rigidity. No Cyanosis, Clubbing or edema, No new Rash or bruise   PERTINENT RADIOLOGIC STUDIES: CT Angio Chest PE W and/or Wo Contrast Result Date: 10/05/2023 CLINICAL DATA:  Coughing and emesis. Reports history of squamous cell carcinoma lower sternal area. Previous other squamous carcinoma sites right neck, upper back, left paraspinal area. Suspected pulmonary embolism. High probability. EXAM: CT ANGIOGRAPHY CHEST WITH CONTRAST TECHNIQUE: Multidetector CT imaging of the  chest was performed using the standard protocol during bolus administration of intravenous contrast. Multiplanar CT image reconstructions and MIPs were obtained to evaluate the vascular anatomy. RADIATION DOSE REDUCTION: This exam was performed according to the departmental dose-optimization program which includes automated exposure control, adjustment of the mA and/or kV according to patient size and/or use of iterative reconstruction technique. CONTRAST:  75mL OMNIPAQUE IOHEXOL 350 MG/ML SOLN COMPARISON:  Portable chest today, PA Lat chest 09/30/2023. FINDINGS: Cardiovascular: The pulmonary trunk is prominent measuring 3.3 cm indicating arterial hypertension. No embolic arterial filling defects are seen. The pulmonary veins are nondistended. There are patchy calcifications in the left main, lad and right coronary arteries. The cardiac size is normal. There is no pericardial effusion. There are mild calcific plaques in the aorta and great vessels without aneurysm, stenosis or dissection. Mediastinum/Nodes: No enlarged mediastinal, hilar, or  axillary lymph nodes. Thyroid gland, trachea, and esophagus demonstrate no significant findings. Small hiatal hernia. There are bilateral cardiophrenic angle fat pads. Lungs/Pleura: There is diffuse bronchial thickening. There are scattered subsegmental bronchial impactions in the posterior basal lower lobes, asymmetric haziness in the posterior basal left lower lobe which could be pneumonitis or atelectasis. There is mild posterior atelectasis both lungs. There are minimal paraseptal emphysematous changes in the lung apices. The lung bases are otherwise clear. There is no pleural effusion or pneumothorax. Upper Abdomen: There is a 1.5 cm hypodensity in the dome of the right lobe of the liver in segment 7, Hounsfield density is 14. This is probably a cyst but given patient's cancer history would suggest follow-up MRI without and with contrast to confirm a benign process. No acute upper abdominal findings. Mild splenomegaly with splenic AP dimension 15.3 cm. Abdominal aortic atherosclerosis. Musculoskeletal: Multilevel spinal bridging enthesopathy. Multilevel degenerative discs. No acute or significant osseous findings. No mass is seen in the chest wall. Review of the MIP images confirms the above findings. IMPRESSION: 1. No evidence of arterial filling defects. 2. Prominent pulmonary trunk indicating arterial hypertension. 3. Aortic and coronary artery atherosclerosis. 4. Diffuse bronchial thickening with scattered subsegmental bronchial impactions in the posterior basal lower lobes. Minimal emphysema both lung apices. 5. Asymmetric haziness in the posterior basal left lower lobe which could be pneumonitis or atelectasis. 6. 1.5 cm hypodensity in the dome of the right lobe of the liver in segment 7, probably a cyst but given patient's cancer history would suggest follow-up MRI without and with contrast to confirm a benign process. 7. Mild splenomegaly. 8. Small hiatal hernia. Aortic Atherosclerosis (ICD10-I70.0) and  Emphysema (ICD10-J43.9). Electronically Signed   By: Almira Bar M.D.   On: 10/05/2023 05:21   DG Chest Port 1 View Result Date: 10/05/2023 CLINICAL DATA:  Questionable sepsis - evaluate for abnormality EXAM: PORTABLE CHEST 1 VIEW COMPARISON:  09/30/2023 FINDINGS: Heart and mediastinal contours are within normal limits. No focal opacities or effusions. No acute bony abnormality. IMPRESSION: No active disease. Electronically Signed   By: Charlett Nose M.D.   On: 10/05/2023 02:38     PERTINENT LAB RESULTS: CBC: Recent Labs    10/05/23 0157 10/06/23 0429  WBC 9.0 8.9  HGB 14.4 13.1  HCT 40.2 37.1*  PLT 160 151   CMET CMP     Component Value Date/Time   NA 136 10/06/2023 0429   K 3.6 10/06/2023 0429   CL 105 10/06/2023 0429   CO2 20 (L) 10/06/2023 0429   GLUCOSE 111 (H) 10/06/2023 0429   BUN 11 10/06/2023 0429   CREATININE 1.31 (  H) 10/06/2023 0429   CALCIUM 8.5 (L) 10/06/2023 0429   PROT 5.5 (L) 10/06/2023 0429   ALBUMIN 2.9 (L) 10/06/2023 0429   AST 31 10/06/2023 0429   ALT 25 10/06/2023 0429   ALKPHOS 44 10/06/2023 0429   BILITOT 1.2 (H) 10/06/2023 0429   GFR 61.06 07/05/2023 0849   GFRNONAA 57 (L) 10/06/2023 0429    GFR Estimated Creatinine Clearance: 64.1 mL/min (A) (by C-G formula based on SCr of 1.31 mg/dL (H)). No results for input(s): "LIPASE", "AMYLASE" in the last 72 hours. No results for input(s): "CKTOTAL", "CKMB", "CKMBINDEX", "TROPONINI" in the last 72 hours. Invalid input(s): "POCBNP" No results for input(s): "DDIMER" in the last 72 hours. No results for input(s): "HGBA1C" in the last 72 hours. No results for input(s): "CHOL", "HDL", "LDLCALC", "TRIG", "CHOLHDL", "LDLDIRECT" in the last 72 hours. Recent Labs    10/06/23 0429  TSH 2.774   No results for input(s): "VITAMINB12", "FOLATE", "FERRITIN", "TIBC", "IRON", "RETICCTPCT" in the last 72 hours. Coags: Recent Labs    10/05/23 0157  INR 1.0   Microbiology: Recent Results (from the past 240  hours)  SARS CORONAVIRUS 2 (TAT 6-24 HRS) Anterior Nasal Swab     Status: None   Collection Time: 09/30/23 11:28 AM   Specimen: Anterior Nasal Swab  Result Value Ref Range Status   SARS Coronavirus 2 NEGATIVE NEGATIVE Final    Comment: (NOTE) SARS-CoV-2 target nucleic acids are NOT DETECTED.  The SARS-CoV-2 RNA is generally detectable in upper and lower respiratory specimens during the acute phase of infection. Negative results do not preclude SARS-CoV-2 infection, do not rule out co-infections with other pathogens, and should not be used as the sole basis for treatment or other patient management decisions. Negative results must be combined with clinical observations, patient history, and epidemiological information. The expected result is Negative.  Fact Sheet for Patients: HairSlick.no  Fact Sheet for Healthcare Providers: quierodirigir.com  This test is not yet approved or cleared by the [place] FDA and  has been authorized for detection and/or diagnosis of SARS-CoV-2 by FDA under an Emergency Use Authorization (EUA). This EUA will remain  in effect (meaning this test can be used) for the duration of the COVID-19 declaration under Se ction 564(b)(1) of the Act, 21 U.S.C. section 360bbb-3(b)(1), unless the authorization is terminated or revoked sooner.  Performed at [facility], [address], [place], [place] [address]   Resp panel by RT-PCR (RSV, Flu A&B, Covid) Anterior Nasal Swab     Status: Abnormal   Collection Time: 10/05/23  2:02 AM   Specimen: Anterior Nasal Swab  Result Value Ref Range Status   SARS Coronavirus 2 by RT PCR NEGATIVE NEGATIVE Final    Comment: (NOTE) SARS-CoV-2 target nucleic acids are NOT DETECTED.  The SARS-CoV-2 RNA is generally detectable in upper respiratory specimens during the acute phase of infection. The lowest concentration of SARS-CoV-2 viral copies this assay can  detect is 138 copies/mL. A negative result does not preclude SARS-Cov-2 infection and should not be used as the sole basis for treatment or other patient management decisions. A negative result may occur with  improper specimen collection/handling, submission of specimen other than nasopharyngeal swab, presence of viral mutation(s) within the areas targeted by this assay, and inadequate number of viral copies(<138 copies/mL). A negative result must be combined with clinical observations, patient history, and epidemiological information. The expected result is Negative.  Fact Sheet for Patients:  BloggerCourse.com  Fact Sheet for Healthcare Providers:  SeriousBroker.it  This test is no t yet approved or cleared by the [place] and  has been authorized for detection and/or diagnosis of SARS-CoV-2 by FDA under an Emergency Use Authorization (EUA). This EUA will remain  in effect (meaning this test can be used) for the duration of the COVID-19 declaration under Section 564(b)(1) of the Act, 21 U.S.C.section 360bbb-3(b)(1), unless the authorization is terminated  or revoked sooner.       Influenza A by PCR POSITIVE (A) NEGATIVE Final   Influenza B by PCR NEGATIVE NEGATIVE Final    Comment: (NOTE) The Xpert Xpress SARS-CoV-2/FLU/RSV plus assay is intended as an aid in the diagnosis of influenza from Nasopharyngeal swab specimens and should not be used as a sole basis for treatment. Nasal washings and aspirates are unacceptable for Xpert Xpress SARS-CoV-2/FLU/RSV testing.  Fact Sheet for Patients: BloggerCourse.com  Fact Sheet for Healthcare Providers: SeriousBroker.it  This test is not yet approved or cleared by the [place] FDA and has been authorized for detection and/or diagnosis of SARS-CoV-2 by FDA under an Emergency Use Authorization (EUA). This EUA will  remain in effect (meaning this test can be used) for the duration of the COVID-19 declaration under Section 564(b)(1) of the Act, 21 U.S.C. section 360bbb-3(b)(1), unless the authorization is terminated or revoked.     Resp Syncytial Virus by PCR NEGATIVE NEGATIVE Final    Comment: (NOTE) Fact Sheet for Patients: BloggerCourse.com  Fact Sheet for Healthcare Providers: SeriousBroker.it  This test is not yet approved or cleared by the [place] FDA and has been authorized for detection and/or diagnosis of SARS-CoV-2 by FDA under an Emergency Use Authorization (EUA). This EUA will remain in effect (meaning this test can be used) for the duration of the COVID-19 declaration under Section 564(b)(1) of the Act, 21 U.S.C. section 360bbb-3(b)(1), unless the authorization is terminated or revoked.  Performed at [facility], [address] [place], [place] [address]   Blood Culture (routine x 2)     Status: None (Preliminary result)   Collection Time: 10/05/23  2:02 AM   Specimen: BLOOD  Result Value Ref Range Status   Specimen Description   Final    BLOOD LEFT ANTECUBITAL Performed at [facility], [address] [address] [place], [place] [address]    Special Requests   Final    BOTTLES DRAWN AEROBIC AND ANAEROBIC Blood Culture results may not be optimal due to an inadequate volume of blood received in culture bottles Performed at [facility], [address] [place], [place] [address]    Culture   Final    NO GROWTH < 24 HOURS Performed at [facility], [address] [place], [place] [address]    Report Status PENDING  Incomplete  Blood Culture (routine x 2)     Status: None (Preliminary result)   Collection Time: 10/05/23  2:07 AM   Specimen: BLOOD  Result Value Ref Range Status   Specimen Description   Final    BLOOD LEFT ANTECUBITAL Performed at [facility], [address] [place], [place] [address]    Special Requests   Final    Blood Culture adequate volume Performed at [facility], [address] [place], [place] [address]    Culture   Final    NO GROWTH < 24 HOURS Performed at [facility], [address], [place], [place] [address]  Report Status PENDING  Incomplete    FURTHER DISCHARGE INSTRUCTIONS:  Get Medicines reviewed and adjusted: Please take all your medications with you for your next visit with your Primary MD  Laboratory/radiological data: Please request your Primary MD to go over all hospital tests and procedure/radiological results at the follow up, please ask your Primary MD to get all Hospital records sent to his/her office.  In some cases, they will be blood work, cultures and biopsy results pending at the time of your discharge. Please request that your primary care M.D. goes through all the records of your hospital data and follows up on these results.  Also Note the following: If you experience worsening of your admission symptoms, develop shortness of breath, life threatening emergency, suicidal or homicidal thoughts you must seek medical attention immediately by calling 911 or calling your MD immediately  if symptoms less severe.  You must read complete instructions/literature along with all the possible adverse reactions/side effects for all the Medicines you take and that have been prescribed to you. Take any new Medicines after you have completely understood and accpet all the possible adverse reactions/side effects.   Do not drive when taking Pain medications or sleeping medications (Benzodaizepines)  Do not take more than prescribed Pain, Sleep and Anxiety Medications. It is not advisable to combine anxiety,sleep and pain medications without talking with your primary care practitioner  Special Instructions: If you have smoked or chewed Tobacco  in the last 2 yrs please stop smoking, stop any  regular Alcohol  and or any Recreational drug use.  Wear Seat belts while driving.  Please note: You were cared for by a hospitalist during your hospital stay. Once you are discharged, your primary care physician will handle any further medical issues. Please note that NO REFILLS for any discharge medications will be authorized once you are discharged, as it is imperative that you return to your primary care physician (or establish a relationship with a primary care physician if you do not have one) for your post hospital discharge needs so that they can reassess your need for medications and monitor your lab values.  Total Time spent coordinating discharge including counseling, education and face to face time equals greater than 30 minutes.  SignedJeoffrey Massed 10/06/2023 2:07 PM

## 2023-10-06 NOTE — Plan of Care (Signed)
  Problem: Education: Goal: Knowledge of General Education information will improve Description: Including pain rating scale, medication(s)/side effects and non-pharmacologic comfort measures 10/06/2023 0556 by Arnetha Courser, RN Outcome: Progressing 10/06/2023 0556 by Arnetha Courser, RN Outcome: Progressing   Problem: Health Behavior/Discharge Planning: Goal: Ability to manage health-related needs will improve 10/06/2023 0556 by Arnetha Courser, RN Outcome: Progressing 10/06/2023 0556 by Arnetha Courser, RN Outcome: Progressing   Problem: Clinical Measurements: Goal: Ability to maintain clinical measurements within normal limits will improve 10/06/2023 0556 by Arnetha Courser, RN Outcome: Progressing 10/06/2023 0556 by Arnetha Courser, RN Outcome: Progressing Goal: Will remain free from infection 10/06/2023 0556 by Arnetha Courser, RN Outcome: Progressing 10/06/2023 0556 by Arnetha Courser, RN Outcome: Progressing Goal: Diagnostic test results will improve 10/06/2023 0556 by Arnetha Courser, RN Outcome: Progressing 10/06/2023 0556 by Arnetha Courser, RN Outcome: Progressing Goal: Respiratory complications will improve 10/06/2023 0556 by Arnetha Courser, RN Outcome: Progressing 10/06/2023 0556 by Arnetha Courser, RN Outcome: Progressing Goal: Cardiovascular complication will be avoided 10/06/2023 0556 by Arnetha Courser, RN Outcome: Progressing 10/06/2023 0556 by Arnetha Courser, RN Outcome: Progressing   Problem: Activity: Goal: Risk for activity intolerance will decrease 10/06/2023 0556 by Arnetha Courser, RN Outcome: Progressing 10/06/2023 0556 by Arnetha Courser, RN Outcome: Progressing   Problem: Nutrition: Goal: Adequate nutrition will be maintained 10/06/2023 0556 by Arnetha Courser, RN Outcome: Progressing 10/06/2023 0556 by Arnetha Courser, RN Outcome:  Progressing   Problem: Coping: Goal: Level of anxiety will decrease 10/06/2023 0556 by Arnetha Courser, RN Outcome: Progressing 10/06/2023 0556 by Arnetha Courser, RN Outcome: Progressing   Problem: Elimination: Goal: Will not experience complications related to bowel motility 10/06/2023 0556 by Arnetha Courser, RN Outcome: Progressing 10/06/2023 0556 by Arnetha Courser, RN Outcome: Progressing Goal: Will not experience complications related to urinary retention 10/06/2023 0556 by Arnetha Courser, RN Outcome: Progressing 10/06/2023 0556 by Arnetha Courser, RN Outcome: Progressing   Problem: Pain Management: Goal: General experience of comfort will improve 10/06/2023 0556 by Arnetha Courser, RN Outcome: Progressing 10/06/2023 0556 by Arnetha Courser, RN Outcome: Progressing   Problem: Safety: Goal: Ability to remain free from injury will improve 10/06/2023 0556 by Arnetha Courser, RN Outcome: Progressing 10/06/2023 0556 by Arnetha Courser, RN Outcome: Progressing   Problem: Skin Integrity: Goal: Risk for impaired skin integrity will decrease 10/06/2023 0556 by Arnetha Courser, RN Outcome: Progressing 10/06/2023 0556 by Arnetha Courser, RN Outcome: Progressing

## 2023-10-06 NOTE — Progress Notes (Signed)
PHARMACY - ANTICOAGULATION CONSULT NOTE  Pharmacy Consult for Eliquis Indication: atrial fibrillation  Allergies  Allergen Reactions   Codeine Nausea And Vomiting and Other (See Comments)    "Can't take in large doses"    Patient Measurements: Height: 6' (182.9 cm) Weight: 116.1 kg (256 lb) IBW/kg (Calculated) : 77.6  Vital Signs: Temp: 98.9 F (37.2 C) (12/11 1735) Temp Source: Oral (12/11 1735) BP: 110/49 (12/12 0000) Pulse Rate: 73 (12/12 0000)  Labs: Recent Labs    10/05/23 0157  HGB 14.4  HCT 40.2  PLT 160  APTT 27  LABPROT 13.7  INR 1.0  CREATININE 1.25*    Estimated Creatinine Clearance: 67.2 mL/min (A) (by C-G formula based on SCr of 1.25 mg/dL (H)).   Medical History: Past Medical History:  Diagnosis Date   Allergy 1960   Arthritis    Basal cell carcinoma 09/12/2017   RIGHT SIDEBURN TX CX3 5FU CAUTERY   Cancer (HCC)    Cataract 2016   Constipation    Environmental allergies    GERD (gastroesophageal reflux disease)    Headache    "bad sinus headache"   History of shingles    History of skin cancer    Hyperlipidemia    Hypertension 05-25-2022   Nocturia    SCC (squamous cell carcinoma) 01/20/2009   LEFT INNER EAR CX3 5FU   SCC (squamous cell carcinoma) 05/05/2010   LEFT PARASPINAL   SCC (squamous cell carcinoma) 09/12/2017   RIGHT NECK TX CX3 5FU   SCC (squamous cell carcinoma) 09/12/2017   RIGHT UPPER BACK TX CX3 5FU CAUTERY   SCC (squamous cell carcinoma) 10/30/2019   LOWER STERNUM TX CX3 5FU CAUTERY   Sleep apnea    Cpap set 10-16   Sleep apnea, obstructive    cpap   Squamous cell carcinoma of skin 01/20/2009   LEFT LOW PARASPINAL CX3 5FU    Medications:  Medications Prior to Admission  Medication Sig Dispense Refill Last Dose/Taking   albuterol (VENTOLIN HFA) 108 (90 Base) MCG/ACT inhaler Inhale 1-2 puffs into the lungs every 6 (six) hours as needed for wheezing or shortness of breath. 18 g 0 10/04/2023   atorvastatin (LIPITOR)  20 MG tablet Take 1 tablet (20 mg total) by mouth daily after supper. 90 tablet 3 10/04/2023   azelastine (ASTELIN) 0.1 % nasal spray Place into both nostrils 2 (two) times daily. Use in each nostril as directed   Past Week   fluticasone (FLONASE) 50 MCG/ACT nasal spray Place 2 sprays into both nostrils daily as needed for allergies.    Past Week   guaiFENesin (MUCINEX) 600 MG 12 hr tablet Take 600 mg by mouth as needed for cough or to loosen phlegm.   unknown   ibuprofen (ADVIL,MOTRIN) 200 MG tablet Take 200 mg by mouth as needed for mild pain (pain score 1-3).   unknown   loratadine (CLARITIN) 10 MG tablet Take 10 mg by mouth daily as needed for allergies.   unknown   omeprazole (PRILOSEC) 20 MG capsule Take 1 capsule (20 mg total) by mouth daily. 90 capsule 3 10/04/2023   Phenylephrine-Acetaminophen (TYLENOL SINUS CONGESTION/PAIN PO) Take 1 capsule by mouth as needed (sinus).   unknown   polyethylene glycol (MIRALAX / GLYCOLAX) 17 g packet Take 17 g by mouth daily.   10/04/2023   predniSONE (DELTASONE) 10 MG tablet Take 3 tablets (30 mg total) by mouth daily with breakfast. 15 tablet 0 10/04/2023   promethazine-dextromethorphan (PROMETHAZINE-DM) 6.25-15 MG/5ML syrup Take 5 mLs  by mouth 3 (three) times daily as needed for cough. 200 mL 0 10/04/2023   Wheat Dextrin (BENEFIBER DRINK MIX PO) Take 1 Dose by mouth daily.   10/04/2023   losartan-hydrochlorothiazide (HYZAAR) 50-12.5 MG tablet Take 1 tablet by mouth daily. (Patient not taking: Reported on 10/05/2023) 90 tablet 3 Not Taking    Assessment: 77 y.o. male with new onset Afib for Eliquis  Plan:  Eliquis 5 mg BID  Lajuanda Penick, Gary Fleet 10/06/2023,4:58 AM

## 2023-10-06 NOTE — TOC Transition Note (Signed)
Transition of Care [facility]) - Discharge Note   Patient Details  Name: Cody [name] MRN: [id number] Date of Birth: [birth year]  [facility] CM/SW Contact:  Gordy Clement, RN Phone Number: 10/06/2023, 3:22 PM   Clinical Narrative:     Patient will DC to home today with Wife.  Code 44 given  No TOC needs identified            Patient Goals and CMS Choice            Discharge Placement                       Discharge Plan and Services Additional resources added to the After Visit Summary for                                       Social Drivers of Health (SDOH) Interventions SDOH Screenings   Food Insecurity: No Food Insecurity (10/06/2023)  Housing: Low Risk  (10/03/2023)  Transportation Needs: No Transportation Needs (10/06/2023)  Utilities: Not At Risk (10/06/2023)  Alcohol Screen: Low Risk  (10/03/2023)  Depression (PHQ2-9): Low Risk  (07/05/2023)  Financial Resource Strain: Low Risk  (10/03/2023)  Physical Activity: Insufficiently Active (10/03/2023)  Social Connections: Unknown (10/03/2023)  Stress: No Stress Concern Present (10/03/2023)  Tobacco Use: Medium Risk (10/06/2023)  Health Literacy: Adequate Health Literacy (05/30/2023)     Readmission Risk Interventions     No data to display

## 2023-10-06 NOTE — Plan of Care (Signed)
  Problem: Education: Goal: Knowledge of General Education information will improve Description: Including pain rating scale, medication(s)/side effects and non-pharmacologic comfort measures Outcome: Progressing   Problem: Clinical Measurements: Goal: Ability to maintain clinical measurements within normal limits will improve Outcome: Progressing Goal: Diagnostic test results will improve Outcome: Progressing   Problem: Coping: Goal: Level of anxiety will decrease Outcome: Progressing   Problem: Elimination: Goal: Will not experience complications related to urinary retention Outcome: Progressing

## 2023-10-06 NOTE — Consult Note (Addendum)
Cardiology Consultation   Patient ID: Cody [name] MRN: [id number]; DOB: [birth year]  Admit date: 10/05/2023 Date of Consult: 10/06/2023  PCP:  Loyola Mast, MD    [facility] Providers Cardiologist:  Maisie Fus, MD  new  Patient Profile:   Cody [name] is a 77 y.o. male with a hx of HTN, HLD, OSA on CPAP, GERD, OA, who was admitted 12/11 with sepsis from Influenza A, rapid Afib.    He is being seen 10/06/2023 for the evaluation of atrial fib at the request of Dr Jerral Ralph.  History of Present Illness:   [name] was in rapid atrial fib on admit. K+ 3.2, Mg 1.3, Lactic acid 2.1, Cr 1.25, Flu A +.   By the time [name] got up to the floor, he was back in sinus rhythm.  He had no awareness of the atrial fibs.  He did not lightheaded or dizzy.  He did not have any palpitations at all.  He does not know when he converted, because he did not feel any different.  He has never had any episodes of presyncope or syncope, has never had palpitations.  Does not get chest pain.  Since being in the hospital and on the medications, he is feeling some better.  He is not back to baseline.  He is still coughing quite a bit.  His eyes were burning previously, but they feel better now.   Past Medical History:  Diagnosis Date   Allergy 1960   Arthritis    Basal cell carcinoma 09/12/2017   RIGHT SIDEBURN TX CX3 5FU CAUTERY   Cancer (HCC)    Cataract 2016   Constipation    Environmental allergies    GERD (gastroesophageal reflux disease)    Headache    "bad sinus headache"   History of shingles    History of skin cancer    Hyperlipidemia    Hypertension 05-25-2022   Nocturia    SCC (squamous cell carcinoma) 01/20/2009   LEFT INNER EAR CX3 5FU   SCC (squamous cell carcinoma) 05/05/2010   LEFT PARASPINAL   SCC (squamous cell carcinoma) 09/12/2017   RIGHT NECK TX CX3 5FU   SCC (squamous cell carcinoma) 09/12/2017   RIGHT UPPER BACK TX CX3 5FU CAUTERY   SCC  (squamous cell carcinoma) 10/30/2019   LOWER STERNUM TX CX3 5FU CAUTERY   Sleep apnea    Cpap set 10-16   Sleep apnea, obstructive    cpap   Squamous cell carcinoma of skin 01/20/2009   LEFT LOW PARASPINAL CX3 5FU    Past Surgical History:  Procedure Laterality Date   CATARACT EXTRACTION W/ INTRAOCULAR LENS IMPLANT Bilateral 10/25/2014   cataracts removed   EYE SURGERY  2016   Lense Implants   INTERPHALANGEAL JOINT ARTHROPLASTY Left    Great toe   JOINT REPLACEMENT  2016-2017   Both Hips   TOTAL HIP ARTHROPLASTY Left 09/04/2015   Procedure: LEFT TOTAL HIP ARTHROPLASTY ANTERIOR APPROACH;  Surgeon: Samson Frederic, MD;  Location: [facility];  Service: Orthopedics;  Laterality: Left;   TOTAL HIP ARTHROPLASTY Right 03/25/2016   Procedure: RIGHT TOTAL HIP ARTHROPLASTY ANTERIOR APPROACH;  Surgeon: Samson Frederic, MD;  Location: [facility];  Service: Orthopedics;  Laterality: Right;     Home Medications:  Prior to Admission medications   Medication Sig Start Date End Date Taking? Authorizing Provider  albuterol (VENTOLIN HFA) 108 (90 Base) MCG/ACT inhaler Inhale 1-2 puffs into the lungs every 6 (six) hours  as needed for wheezing or shortness of breath. 09/30/23  Yes Wallis Bamberg, PA-C  atorvastatin (LIPITOR) 20 MG tablet Take 1 tablet (20 mg total) by mouth daily after supper. 07/25/23  Yes Loyola Mast, MD  azelastine (ASTELIN) 0.1 % nasal spray Place into both nostrils 2 (two) times daily. Use in each nostril as directed   Yes [provider]  fluticasone (FLONASE) 50 MCG/ACT nasal spray Place 2 sprays into both nostrils daily as needed for allergies.    Yes [provider]  guaiFENesin (MUCINEX) 600 MG 12 hr tablet Take 600 mg by mouth as needed for cough or to loosen phlegm.   Yes [provider]  ibuprofen (ADVIL,MOTRIN) 200 MG tablet Take 200 mg by mouth as needed for mild pain (pain score 1-3).   Yes [provider]  loratadine (CLARITIN) 10 MG tablet  Take 10 mg by mouth daily as needed for allergies.   Yes [provider]  omeprazole (PRILOSEC) 20 MG capsule Take 1 capsule (20 mg total) by mouth daily. 07/25/23  Yes RuddBertram Millard, MD  Phenylephrine-Acetaminophen (TYLENOL SINUS CONGESTION/PAIN PO) Take 1 capsule by mouth as needed (sinus).   Yes [provider]  polyethylene glycol (MIRALAX / GLYCOLAX) 17 g packet Take 17 g by mouth daily.   Yes [provider]  predniSONE (DELTASONE) 10 MG tablet Take 3 tablets (30 mg total) by mouth daily with breakfast. 09/30/23  Yes Wallis Bamberg, PA-C  promethazine-dextromethorphan (PROMETHAZINE-DM) 6.25-15 MG/5ML syrup Take 5 mLs by mouth 3 (three) times daily as needed for cough. 09/30/23  Yes Wallis Bamberg, PA-C  Wheat Dextrin (BENEFIBER DRINK MIX PO) Take 1 Dose by mouth daily.   Yes [provider]  losartan-hydrochlorothiazide (HYZAAR) 50-12.5 MG tablet Take 1 tablet by mouth daily. Patient not taking: Reported on 10/05/2023 10/04/23   Loyola Mast, MD    Inpatient Medications: Scheduled Meds:  apixaban  5 mg Oral BID   atorvastatin  20 mg Oral QPC supper   guaiFENesin  600 mg Oral BID   metoprolol tartrate  12.5 mg Oral BID   pantoprazole  40 mg Oral Daily   Continuous Infusions:  amiodarone 30 mg/hr (10/06/23 1055)   PRN Meds: acetaminophen **OR** acetaminophen, benzonatate, ipratropium-albuterol, melatonin, metoprolol tartrate, ondansetron (ZOFRAN) [name], polyvinyl alcohol  Allergies:    Allergies  Allergen Reactions   Codeine Nausea And Vomiting and Other (See Comments)    "Can't take in large doses"    Social History:   Social History   Socioeconomic History   Marital status: Married    Spouse name: Not on file   Number of children: 2   Years of education: Not on file   Highest education level: 12th grade  Occupational History   Occupation: Retired  Tobacco Use   Smoking status: Former    Current packs/day: 0.00    Average packs/day: 1.5  packs/day for 15.0 years (22.5 ttl pk-yrs)    Types: Cigarettes    Start date: 08/25/1967    Quit date: 08/24/1982    Years since quitting: 41.1   Smokeless tobacco: Never  Vaping Use   Vaping status: Never Used  Substance and Sexual Activity   Alcohol use: Yes    Alcohol/week: 5.0 standard drinks of alcohol    Types: 5 Standard drinks or equivalent per week    Comment: 1 drink daily   Drug use: No   Sexual activity: Yes  Other Topics Concern   Not on file  Social  History Narrative   Not on file   Social Drivers of Health   Financial Resource Strain: Low Risk  (10/03/2023)   Overall Financial Resource Strain (CARDIA)    Difficulty of Paying Living Expenses: Not hard at all  Food Insecurity: No Food Insecurity (10/06/2023)   Hunger Vital Sign    Worried About Running Out of Food in the Last Year: Never true    Ran Out of Food in the Last Year: Never true  Transportation Needs: No Transportation Needs (10/06/2023)   PRAPARE - Administrator, Civil Service (Medical): No    Lack of Transportation (Non-Medical): No  Physical Activity: Insufficiently Active (10/03/2023)   Exercise Vital Sign    Days of Exercise per Week: 5 days    Minutes of Exercise per Session: 10 min  Stress: No Stress Concern Present (10/03/2023)   [employer] of Occupational Health - Occupational Stress Questionnaire    Feeling of Stress : Not at all  Social Connections: Unknown (10/03/2023)   Social Connection and Isolation Panel [NHANES]    Frequency of Communication with Friends and Family: More than three times a week    Frequency of Social Gatherings with Friends and Family: Twice a week    Attends Religious Services: Not on Insurance claims handler of Clubs or Organizations: No    Attends Banker Meetings: Not on file    Marital Status: Married  Intimate Partner Violence: Not At Risk (10/06/2023)   Humiliation, Afraid, Rape, and Kick questionnaire    Fear of Current or  Ex-Partner: No    Emotionally Abused: No    Physically Abused: No    Sexually Abused: No    Family History:   Family History  Problem Relation Age of Onset   Heart failure Mother    Heart Problems Mother        pacer   Alcohol abuse Mother    Arthritis Mother    Drug abuse Mother    Stroke Father    Hearing loss Father    Fragile X syndrome Father    Hypertension Sister    Other Sister        healthty   Other Son        healthy   Other Son        healthy     ROS:  Please see the history of present illness.  All other ROS reviewed and negative.     Physical Exam/Data:   Vitals:   10/05/23 2300 10/06/23 0000 10/06/23 0100 10/06/23 0500  BP: 120/66 (!) 110/49    Pulse: 78 73 78   Resp: 16 15 (!) 24   Temp:      TempSrc:      SpO2: 96% 94% 95%   Weight:    116.2 kg  Height:        Intake/Output Summary (Last 24 hours) at 10/06/2023 1323 Last data filed at 10/06/2023 0906 Gross per 24 hour  Intake 802.42 ml  Output --  Net 802.42 ml      10/06/2023    5:00 AM 10/05/2023    1:41 AM 10/04/2023    8:12 AM  Last 3 Weights  Weight (lbs) 256 lb 2.8 oz 256 lb 264 lb 12.8 oz  Weight (kg) 116.2 kg 116.121 kg 120.112 kg     Body mass index is 34.74 kg/m.  General:  Well nourished, well developed, in no acute distress HEENT: normal Neck: no JVD seen, difficult  to assess secondary to body habitus Vascular: No carotid bruits; Distal pulses 2+ bilaterally Cardiac:  normal S1, S2; RRR; no murmur  Lungs: Rales, some rhonchi, slight expiratory wheezing bilaterally  Abd: soft, nontender, no hepatomegaly  Ext: no edema Musculoskeletal:  No deformities, BUE and BLE strength normal and equal Skin: warm and dry  Neuro:  CNs 2-12 intact, no focal abnormalities noted Psych:  Normal affect   EKG:  The EKG was personally reviewed and demonstrates:  atrial fib, RVR, HR 150 Telemetry:  Telemetry was personally reviewed and demonstrates: By arrival up on the floor, he was in  sinus rhythm.  Relevant CV Studies:  ECHO: ordered  Laboratory Data:  High Sensitivity Troponin:  No results for input(s): "TROPONINIHS" in the last 720 hours.   Chemistry Recent Labs  Lab 10/05/23 0157 10/05/23 0646 10/06/23 0429  NA 132*  --  136  K 3.2*  --  3.6  CL 101  --  105  CO2 23  --  20*  GLUCOSE 117*  --  111*  BUN 11  --  11  CREATININE 1.25*  --  1.31*  CALCIUM 8.4*  --  8.5*  MG  --  1.3* 1.9  GFRNONAA >60  --  57*  ANIONGAP 8  --  11    Recent Labs  Lab 10/05/23 0157 10/06/23 0429  PROT 6.7 5.5*  ALBUMIN 3.8 2.9*  AST 31 31  ALT 31 25  ALKPHOS 63 44  BILITOT 1.3* 1.2*   Lipids No results for input(s): "CHOL", "TRIG", "HDL", "LABVLDL", "LDLCALC", "CHOLHDL" in the last 168 hours.  Hematology Recent Labs  Lab 10/05/23 0157 10/06/23 0429  WBC 9.0 8.9  RBC 4.87 4.44  HGB 14.4 13.1  HCT 40.2 37.1*  MCV 82.5 83.6  MCH 29.6 29.5  MCHC 35.8 35.3  RDW 13.6 13.8  PLT 160 151   Thyroid  Recent Labs  Lab 10/06/23 0429  TSH 2.774    BNPNo results for input(s): "BNP", "PROBNP" in the last 168 hours.  DDimer No results for input(s): "DDIMER" in the last 168 hours.   Radiology/Studies:  CT Angio Chest PE W and/or Wo Contrast Result Date: 10/05/2023 CLINICAL DATA:  Coughing and emesis. Reports history of squamous cell carcinoma lower sternal area. Previous other squamous carcinoma sites right neck, upper back, left paraspinal area. Suspected pulmonary embolism. High probability. EXAM: CT ANGIOGRAPHY CHEST WITH CONTRAST TECHNIQUE: Multidetector CT imaging of the chest was performed using the standard protocol during bolus administration of intravenous contrast. Multiplanar CT image reconstructions and MIPs were obtained to evaluate the vascular anatomy. RADIATION DOSE REDUCTION: This exam was performed according to the departmental dose-optimization program which includes automated exposure control, adjustment of the mA and/or kV according to patient  size and/or use of iterative reconstruction technique. CONTRAST:  75mL OMNIPAQUE IOHEXOL 350 MG/ML SOLN COMPARISON:  Portable chest today, PA Lat chest 09/30/2023. FINDINGS: Cardiovascular: The pulmonary trunk is prominent measuring 3.3 cm indicating arterial hypertension. No embolic arterial filling defects are seen. The pulmonary veins are nondistended. There are patchy calcifications in the left main, lad and right coronary arteries. The cardiac size is normal. There is no pericardial effusion. There are mild calcific plaques in the aorta and great vessels without aneurysm, stenosis or dissection. Mediastinum/Nodes: No enlarged mediastinal, hilar, or axillary lymph nodes. Thyroid gland, trachea, and esophagus demonstrate no significant findings. Small hiatal hernia. There are bilateral cardiophrenic angle fat pads. Lungs/Pleura: There is diffuse bronchial thickening. There are scattered subsegmental bronchial  impactions in the posterior basal lower lobes, asymmetric haziness in the posterior basal left lower lobe which could be pneumonitis or atelectasis. There is mild posterior atelectasis both lungs. There are minimal paraseptal emphysematous changes in the lung apices. The lung bases are otherwise clear. There is no pleural effusion or pneumothorax. Upper Abdomen: There is a 1.5 cm hypodensity in the dome of the right lobe of the liver in segment 7, Hounsfield density is 14. This is probably a cyst but given patient's cancer history would suggest follow-up MRI without and with contrast to confirm a benign process. No acute upper abdominal findings. Mild splenomegaly with splenic AP dimension 15.3 cm. Abdominal aortic atherosclerosis. Musculoskeletal: Multilevel spinal bridging enthesopathy. Multilevel degenerative discs. No acute or significant osseous findings. No mass is seen in the chest wall. Review of the MIP images confirms the above findings. IMPRESSION: 1. No evidence of arterial filling defects. 2.  Prominent pulmonary trunk indicating arterial hypertension. 3. Aortic and coronary artery atherosclerosis. 4. Diffuse bronchial thickening with scattered subsegmental bronchial impactions in the posterior basal lower lobes. Minimal emphysema both lung apices. 5. Asymmetric haziness in the posterior basal left lower lobe which could be pneumonitis or atelectasis. 6. 1.5 cm hypodensity in the dome of the right lobe of the liver in segment 7, probably a cyst but given patient's cancer history would suggest follow-up MRI without and with contrast to confirm a benign process. 7. Mild splenomegaly. 8. Small hiatal hernia. Aortic Atherosclerosis (ICD10-I70.0) and Emphysema (ICD10-J43.9). Electronically Signed   By: Almira Bar M.D.   On: 10/05/2023 05:21   DG Chest Port 1 View Result Date: 10/05/2023 CLINICAL DATA:  Questionable sepsis - evaluate for abnormality EXAM: PORTABLE CHEST 1 VIEW COMPARISON:  09/30/2023 FINDINGS: Heart and mediastinal contours are within normal limits. No focal opacities or effusions. No acute bony abnormality. IMPRESSION: No active disease. Electronically Signed   By: Charlett Nose M.D.   On: 10/05/2023 02:38     Assessment and Plan:   PAF: -He is on [name] amiodarone, and maintaining sinus rhythm. -An echo was ordered, not done yet -His blood pressure has been running very high at home, but he has not noticed any unusual heart rates. -He was not on any kind of rate lowering medications at home -We will follow-up on echo results -Discuss with MD if amiodarone can be converted to oral - he likely needs a monitor at discharge, since he did not know he was in A-fib and may have had it previously -CHA2DS2-VASc is 3, due to age and hypertension, IM has started him on Eliquis  2.  Hypertension: -According to the patient, blood pressures at home are running as high as 240 systolic. -He saw his family physician on 12/10.  Dr. Veto Kemps changed him from losartan 50 mg daily to  losartan/HCTZ 50/12.5 mg daily. -However, he ended up feeling so bad he came to the hospital before they had a chance to pick it up and it is still at the pharmacy. -Currently, he is off the losartan -At this time, his only blood pressure medication is metoprolol 12.5 mg twice daily -Initially, because of the sepsis, he had some low blood pressures, but they have improved  -Continue to follow, SBPs are pretty normal now  3.  Influenza and other issues, per IM   Risk Assessment/Risk Scores:     CHA2DS2-VASc Score = 3   This indicates a 3.2% annual risk of stroke. The patient's score is based upon: CHF History: 0 HTN History:  1 Diabetes History: 0 Stroke History: 0 Vascular Disease History: 0 Age Score: 2 Gender Score: 0   For questions or updates, please contact [facility] Please consult [email or web address] for contact info under    Signed, Theodore Demark, PA-C  10/06/2023 1:23 PM

## 2023-10-06 NOTE — Care Management CC44 (Signed)
Condition Code 44 Documentation Completed  Patient Details  Name: KAIDENN KROLL MRN: [id number] Date of Birth: 16-Apr-1946   Condition Code 44 given:  Yes Patient signature on Condition Code 44 notice:  Yes Documentation of 2 MD's agreement:  Yes Code 44 added to claim:  Yes    Gordy Clement, RN 10/06/2023, 2:20 PM

## 2023-10-06 NOTE — Discharge Instructions (Signed)
Information on my medicine - ELIQUIS (apixaban)  This medication education was reviewed with me or my healthcare representative as part of my discharge preparation.     Why was Eliquis prescribed for you? Eliquis was prescribed for you to reduce the risk of a blood clot forming that can cause a stroke if you have a medical condition called atrial fibrillation (a type of irregular heartbeat).  What do You need to know about Eliquis ? Take your Eliquis TWICE DAILY - one tablet in the morning and one tablet in the evening with or without food. If you have difficulty swallowing the tablet whole please discuss with your pharmacist how to take the medication safely.  Take Eliquis exactly as prescribed by your doctor and DO NOT stop taking Eliquis without talking to the doctor who prescribed the medication.  Stopping may increase your risk of developing a stroke.  Refill your prescription before you run out.  After discharge, you should have regular check-up appointments with your healthcare provider that is prescribing your Eliquis.  In the future your dose may need to be changed if your kidney function or weight changes by a significant amount or as you get older.  What do you do if you miss a dose? If you miss a dose, take it as soon as you remember on the same day and resume taking twice daily.  Do not take more than one dose of ELIQUIS at the same time to make up a missed dose.  Important Safety Information A possible side effect of Eliquis is bleeding. You should call your healthcare provider right away if you experience any of the following: Bleeding from an injury or your nose that does not stop. Unusual colored urine (red or dark brown) or unusual colored stools (red or black). Unusual bruising for unknown reasons. A serious fall or if you hit your head (even if there is no bleeding).  Some medicines may interact with Eliquis and might increase your risk of bleeding or clotting  while on Eliquis. To help avoid this, consult your healthcare provider or pharmacist prior to using any new prescription or non-prescription medications, including herbals, vitamins, non-steroidal anti-inflammatory drugs (NSAIDs) and supplements.  This website has more information on Eliquis (apixaban): http://www.eliquis.com/eliquis/home =======================================  Atrial Fibrillation    Atrial fibrillation is a type of heartbeat that is irregular or fast. If you have this condition, your heart beats without any order. This makes it hard for your heart to pump blood in a normal way. Atrial fibrillation may come and go, or it may become a long-lasting problem. If this condition is not treated, it can put you at higher risk for stroke, heart failure, and other heart problems.  What are the causes? This condition may be caused by diseases that damage the heart. They include: High blood pressure. Heart failure. Heart valve disease. Heart surgery. Other causes include: Diabetes. Thyroid disease. Being overweight. Kidney disease. Sometimes the cause is not known.  What increases the risk? You are more likely to develop this condition if: You are older. You smoke. You exercise often and very hard. You have a family history of this condition. You are a man. You use drugs. You drink a lot of alcohol. You have lung conditions, such as emphysema, pneumonia, or COPD. You have sleep apnea.  What are the signs or symptoms? Common symptoms of this condition include: A feeling that your heart is beating very fast. Chest pain or discomfort. Feeling short of breath. Suddenly feeling   light-headed or weak. Getting tired easily during activity. Fainting. Sweating. In some cases, there are no symptoms.  How is this treated? Treatment for this condition depends on underlying conditions and how you feel when you have atrial fibrillation. They include: Medicines to: Prevent  blood clots. Treat heart rate or heart rhythm problems. Using devices, such as a pacemaker, to correct heart rhythm problems. Doing surgery to remove the part of the heart that sends bad signals. Closing an area where clots can form in the heart (left atrial appendage). In some cases, your doctor will treat other underlying conditions.  Follow these instructions at home:  Medicines Take over-the-counter and prescription medicines only as told by your doctor. Do not take any new medicines without first talking to your doctor. If you are taking blood thinners: Talk with your doctor before you take any medicines that have aspirin or NSAIDs, such as ibuprofen, in them. Take your medicine exactly as told by your doctor. Take it at the same time each day. Avoid activities that could hurt or bruise you. Follow instructions about how to prevent falls. Wear a bracelet that says you are taking blood thinners. Or, carry a card that lists what medicines you take. Lifestyle         Do not use any products that have nicotine or tobacco in them. These include cigarettes, e-cigarettes, and chewing tobacco. If you need help quitting, ask your doctor. Eat heart-healthy foods. Talk with your doctor about the right eating plan for you. Exercise regularly as told by your doctor. Do not drink alcohol. Lose weight if you are overweight. Do not use drugs, including cannabis.  General instructions If you have a condition that causes breathing to stop for a short period of time (apnea), treat it as told by your doctor. Keep a healthy weight. Do not use diet pills unless your doctor says they are safe for you. Diet pills may make heart problems worse. Keep all follow-up visits as told by your doctor. This is important.  Contact a doctor if: You notice a change in the speed, rhythm, or strength of your heartbeat. You are taking a blood-thinning medicine and you get more bruising. You get tired more easily  when you move or exercise. You have a sudden change in weight.  Get help right away if:    You have pain in your chest or your belly (abdomen). You have trouble breathing. You have side effects of blood thinners, such as blood in your vomit, poop (stool), or pee (urine), or bleeding that cannot stop. You have any signs of a stroke. "BE FAST" is an easy way to remember the main warning signs: B - Balance. Signs are dizziness, sudden trouble walking, or loss of balance. E - Eyes. Signs are trouble seeing or a change in how you see. F - Face. Signs are sudden weakness or loss of feeling in the face, or the face or eyelid drooping on one side. A - Arms. Signs are weakness or loss of feeling in an arm. This happens suddenly and usually on one side of the body. S - Speech. Signs are sudden trouble speaking, slurred speech, or trouble understanding what people say. T - Time. Time to call emergency services. Write down what time symptoms started. You have other signs of a stroke, such as: A sudden, very bad headache with no known cause. Feeling like you may vomit (nausea). Vomiting. A seizure.  These symptoms may be an emergency. Do not wait to   see if the symptoms will go away. Get medical help right away. Call your local emergency services (911 in the [place] Do not drive yourself to the hospital. Summary Atrial fibrillation is a type of heartbeat that is irregular or fast. You are at higher risk of this condition if you smoke, are older, have diabetes, or are overweight. Follow your doctor's instructions about medicines, diet, exercise, and follow-up visits. Get help right away if you have signs or symptoms of a stroke. Get help right away if you cannot catch your breath, or you have chest pain or discomfort. This information is not intended to replace advice given to you by your health care provider. Make sure you discuss any questions you have with your health care provider. Document  Revised: 04/04/2019 Document Reviewed: 04/04/2019 Elsevier Patient Education  2020 [employer]    

## 2023-10-07 ENCOUNTER — Ambulatory Visit (HOSPITAL_COMMUNITY)
Admit: 2023-10-07 | Discharge: 2023-10-07 | Disposition: A | Discharge status: Home / Self Care | Payer: Medicare HMO | Attending: Physician Assistant | Admitting: Physician Assistant

## 2023-10-07 ENCOUNTER — Other Ambulatory Visit (HOSPITAL_COMMUNITY): Payer: Self-pay | Admitting: *Deleted

## 2023-10-07 ENCOUNTER — Other Ambulatory Visit (HOSPITAL_COMMUNITY): Payer: Self-pay | Admitting: Physician Assistant

## 2023-10-07 DIAGNOSIS — I5043 Acute on chronic combined systolic (congestive) and diastolic (congestive) heart failure: Secondary | ICD-10-CM | POA: Insufficient documentation

## 2023-10-07 DIAGNOSIS — I4891 Unspecified atrial fibrillation: Secondary | ICD-10-CM | POA: Diagnosis not present

## 2023-10-07 DIAGNOSIS — I080 Rheumatic disorders of both mitral and aortic valves: Secondary | ICD-10-CM | POA: Insufficient documentation

## 2023-10-07 DIAGNOSIS — N189 Chronic kidney disease, unspecified: Secondary | ICD-10-CM | POA: Insufficient documentation

## 2023-10-07 DIAGNOSIS — I130 Hypertensive heart and chronic kidney disease with heart failure and stage 1 through stage 4 chronic kidney disease, or unspecified chronic kidney disease: Secondary | ICD-10-CM | POA: Diagnosis not present

## 2023-10-07 DIAGNOSIS — E785 Hyperlipidemia, unspecified: Secondary | ICD-10-CM | POA: Insufficient documentation

## 2023-10-07 DIAGNOSIS — G4730 Sleep apnea, unspecified: Secondary | ICD-10-CM | POA: Insufficient documentation

## 2023-10-07 LAB — ECHOCARDIOGRAM COMPLETE
AR max vel: 2.78 cm2
AV Area VTI: 2.61 cm2
AV Area mean vel: 2.59 cm2
AV Mean grad: 2 mm[Hg]
AV Peak grad: 4.2 mm[Hg]
Ao pk vel: 1.02 m/s
Area-P 1/2: 3.61 cm2
S' Lateral: 3.1 cm

## 2023-10-07 NOTE — Progress Notes (Signed)
*  PRELIMINARY RESULTS* Echocardiogram 2D Echocardiogram has been performed.  Cody [name] 10/07/2023, 10:40 AM

## 2023-10-10 ENCOUNTER — Telehealth: Payer: Self-pay | Admitting: Family Medicine

## 2023-10-10 LAB — CULTURE, BLOOD (ROUTINE X 2)
Culture: NO GROWTH
Culture: NO GROWTH
Special Requests: ADEQUATE

## 2023-10-10 NOTE — Telephone Encounter (Signed)
Patient states that his BP is creeping up since being in th hospital with influenza and A-Fib.  148/77 - 186/97.   Please review and advise.  Thanks Dm/cma

## 2023-10-10 NOTE — Telephone Encounter (Signed)
Patient called and just wanted a call back came out the hospital and we scheduled a Hospital f/u for wednesday 12/18 but states his BP has been slowly rising. Please call

## 2023-10-11 NOTE — Telephone Encounter (Signed)
Patient notified VIA phone.  He is not having symptoms (HA or chest pain).  He is still coughing and not sleeping due to getting over the Flu.  Dm/cma

## 2023-10-12 ENCOUNTER — Ambulatory Visit (INDEPENDENT_AMBULATORY_CARE_PROVIDER_SITE_OTHER): Payer: Medicare HMO | Admitting: Family Medicine

## 2023-10-12 ENCOUNTER — Encounter: Payer: Self-pay | Admitting: Family Medicine

## 2023-10-12 VITALS — BP 150/76 | HR 64 | Temp 97.6°F | Ht 72.0 in | Wt 254.6 lb

## 2023-10-12 DIAGNOSIS — R6520 Severe sepsis without septic shock: Secondary | ICD-10-CM | POA: Diagnosis not present

## 2023-10-12 DIAGNOSIS — R160 Hepatomegaly, not elsewhere classified: Secondary | ICD-10-CM | POA: Diagnosis not present

## 2023-10-12 DIAGNOSIS — A419 Sepsis, unspecified organism: Secondary | ICD-10-CM | POA: Diagnosis not present

## 2023-10-12 DIAGNOSIS — I4891 Unspecified atrial fibrillation: Secondary | ICD-10-CM | POA: Diagnosis not present

## 2023-10-12 DIAGNOSIS — I10 Essential (primary) hypertension: Secondary | ICD-10-CM

## 2023-10-12 DIAGNOSIS — K7689 Other specified diseases of liver: Secondary | ICD-10-CM | POA: Insufficient documentation

## 2023-10-12 DIAGNOSIS — J101 Influenza due to other identified influenza virus with other respiratory manifestations: Secondary | ICD-10-CM | POA: Diagnosis not present

## 2023-10-12 LAB — COMPREHENSIVE METABOLIC PANEL
ALT: 39 U/L (ref 0–53)
AST: 19 U/L (ref 0–37)
Albumin: 4.2 g/dL (ref 3.5–5.2)
Alkaline Phosphatase: 66 U/L (ref 39–117)
BUN: 12 mg/dL (ref 6–23)
CO2: 27 meq/L (ref 19–32)
Calcium: 9.1 mg/dL (ref 8.4–10.5)
Chloride: 105 meq/L (ref 96–112)
Creatinine, Ser: 1.2 mg/dL (ref 0.40–1.50)
GFR: 59.12 mL/min — ABNORMAL LOW (ref >60.00)
Glucose, Bld: 85 mg/dL (ref 70–99)
Potassium: 3.7 meq/L (ref 3.5–5.1)
Sodium: 138 meq/L (ref 135–145)
Total Bilirubin: 0.9 mg/dL (ref 0.2–1.2)
Total Protein: 7 g/dL (ref 6.0–8.3)

## 2023-10-12 LAB — CBC
HCT: 42.7 % (ref 39.0–52.0)
Hemoglobin: 14.7 g/dL (ref 13.0–17.0)
MCHC: 34.5 g/dL (ref 30.0–36.0)
MCV: 85.2 fL (ref 78.0–100.0)
Platelets: 371 10*3/uL (ref 150.0–400.0)
RBC: 5.01 Mil/uL (ref 4.22–5.81)
RDW: 13.6 % (ref 11.5–15.5)
WBC: 10.3 10*3/uL (ref 4.0–10.5)

## 2023-10-12 MED ORDER — LOSARTAN POTASSIUM-HCTZ 50-12.5 MG PO TABS: 1.0000 | ORAL_TABLET | Freq: Every day | ORAL | 3 refills | Status: AC

## 2023-10-12 NOTE — Assessment & Plan Note (Signed)
Resolved. Vitals now normal. I discussed him allowing enough time for recovery, getting his rest, and eating and drinking well.

## 2023-10-12 NOTE — Assessment & Plan Note (Signed)
Incidental finding on chest CT. I will order an MRI scan as recommended by radiology to further assess.

## 2023-10-12 NOTE — Assessment & Plan Note (Signed)
Resolving. I will order a CBC and CMP as recommended by the discharging physician.

## 2023-10-12 NOTE — Assessment & Plan Note (Signed)
New onset. Complete initial course of amiodarone and then continue amiodarone 200 mg daily. Continue metoprolol tartrate 25 mg bid for rate control. Continue apixaban 5 mg bid for stroke prevention.

## 2023-10-12 NOTE — Progress Notes (Signed)
[facility] [address] [facility] [address] [place] [address] Dept: [phone] Dept Fax: [phone]  Hospital Follow-up Visit  Subjective:    Patient ID: Cody [name], male    DOB: 06-15-1946, 77 y.o..   MRN: [id number]  Chief Complaint  Patient presents with   Hospitalization Follow-up    Hospital f/u Influenza also having elevated BP issues.    History of Present Illness:  Patient is in today for follow-up from his recent hospitalization. Cody [name] was admitted at [facility] from 12/11-12/09/2023 with severe sepsis secondary to influenza A and new-onset of atrial fibrillation with RVR. He is now on amiodarone, metoprolol, and apixaban. He notes he continues to feel some weakness and have some cough, but both are improving.  Past Medical History: Patient Active Problem List   Diagnosis Date Noted   Atrial fibrillation with rapid ventricular response (HCC) 10/06/2023   Severe sepsis (HCC) 10/06/2023   Fever 10/06/2023   Hypomagnesemia 10/06/2023   Hypokalemia 10/06/2023   Influenza A 10/05/2023   Actinic keratoses 07/05/2023   Diastasis recti 07/05/2023   Neurosensory deafness 07/05/2023   Osteoarthritis of multiple joints 07/05/2023   Essential hypertension 04/19/2023   GERD (gastroesophageal reflux disease) 04/19/2023   Class 2 obesity due to excess calories with body mass index (BMI) of 35.0 to 35.9 in adult 04/19/2023   CKD (chronic kidney disease) stage 3, GFR 30-59 ml/min (HCC) 04/19/2023   Prediabetes 04/19/2023   OSA (obstructive sleep apnea) 06/29/2017   HLD (hyperlipidemia) 06/20/2015   Seasonal allergic rhinitis 01/21/2009   Past Surgical History:  Procedure Laterality Date   CATARACT EXTRACTION W/ INTRAOCULAR LENS IMPLANT Bilateral 10/25/2014   cataracts removed   EYE SURGERY  2016   Lense Implants   INTERPHALANGEAL JOINT ARTHROPLASTY Left    Great toe   JOINT REPLACEMENT  2016-2017   Both Hips   TOTAL HIP  ARTHROPLASTY Left 09/04/2015   Procedure: LEFT TOTAL HIP ARTHROPLASTY ANTERIOR APPROACH;  Surgeon: Samson Frederic, MD;  Location: [facility];  Service: Orthopedics;  Laterality: Left;   TOTAL HIP ARTHROPLASTY Right 03/25/2016   Procedure: RIGHT TOTAL HIP ARTHROPLASTY ANTERIOR APPROACH;  Surgeon: Samson Frederic, MD;  Location: [facility];  Service: Orthopedics;  Laterality: Right;   Family History  Problem Relation Age of Onset   Heart failure Mother    Heart Problems Mother        pacer   Alcohol abuse Mother    Arthritis Mother    Drug abuse Mother    Stroke Father    Hearing loss Father    Fragile X syndrome Father    Hypertension Sister    Other Sister        healthty   Other Son        healthy   Other Son        healthy   Outpatient Medications Prior to Visit  Medication Sig Dispense Refill   albuterol (VENTOLIN HFA) 108 (90 Base) MCG/ACT inhaler Inhale 1-2 puffs into the lungs every 6 (six) hours as needed for wheezing or shortness of breath. 18 g 0   amiodarone (PACERONE) 200 MG tablet Take 400 mg by mouth twice daily x 10 days, followed by 200 mg by mouth daily 60 tablet 1   apixaban (ELIQUIS) 5 MG TABS tablet Take 1 tablet (5 mg total) by mouth 2 (two) times daily. 60 tablet 1   atorvastatin (LIPITOR) 20 MG tablet Take 1 tablet (20 mg total) by mouth daily after  supper. 90 tablet 3   azelastine (ASTELIN) 0.1 % nasal spray Place into both nostrils 2 (two) times daily. Use in each nostril as directed     benzonatate (TESSALON) 200 MG capsule Take 1 capsule (200 mg total) by mouth 3 (three) times daily as needed for cough. 20 capsule 0   fluticasone (FLONASE) 50 MCG/ACT nasal spray Place 2 sprays into both nostrils daily as needed for allergies.      guaiFENesin (MUCINEX) 600 MG 12 hr tablet Take 600 mg by mouth as needed for cough or to loosen phlegm.     loratadine (CLARITIN) 10 MG tablet Take 10 mg by mouth daily as needed for allergies.     metoprolol tartrate (LOPRESSOR) 25 MG  tablet Take 1 tablet (25 mg total) by mouth 2 (two) times daily. 60 tablet 1   Multiple Vitamin (MULTIVITAMIN) tablet Take 1 tablet by mouth daily.     omeprazole (PRILOSEC) 20 MG capsule Take 1 capsule (20 mg total) by mouth daily. 90 capsule 3   Phenylephrine-Acetaminophen (TYLENOL SINUS CONGESTION/PAIN PO) Take 1 capsule by mouth as needed (sinus).     polyethylene glycol (MIRALAX / GLYCOLAX) 17 g packet Take 17 g by mouth daily.     promethazine-dextromethorphan (PROMETHAZINE-DM) 6.25-15 MG/5ML syrup Take 5 mLs by mouth 3 (three) times daily as needed for cough. 200 mL 0   pseudoephedrine (SUDAFED) 120 MG 12 hr tablet Take 120 mg by mouth 2 (two) times daily.     Wheat Dextrin (BENEFIBER DRINK MIX PO) Take 1 Dose by mouth daily.     benzonatate (TESSALON PERLES) 100 MG capsule Take 1 capsule (100 mg total) by mouth 3 (three) times daily as needed for cough. 30 capsule 0   No facility-administered medications prior to visit.   Allergies  Allergen Reactions   Codeine Nausea And Vomiting and Other (See Comments)    "Can't take in large doses"     Objective:   Today's Vitals   10/12/23 1108  BP: (!) 150/76  Pulse: 64  Temp: 97.6 F (36.4 C)  TempSrc: Temporal  SpO2: 98%  Weight: 254 lb 9.6 oz (115.5 kg)  Height: 6' (1.829 m)   Body mass index is 34.53 kg/m.   General: Well developed, well nourished. No acute distress. Lungs: Clear to auscultation bilaterally. No wheezing, rales or rhonchi. CV: RRR without murmurs or rubs. Pulses 2+ bilaterally. Psych: Alert and oriented. Normal mood and affect.  There are no preventive care reminders to display for this patient.  Imaging: CT Angio of Chest (10/05/2023) IMPRESSION: 1. No evidence of arterial filling defects. 2. Prominent pulmonary trunk indicating arterial hypertension. 3. Aortic and coronary artery atherosclerosis. 4. Diffuse bronchial thickening with scattered subsegmental bronchial impactions in the posterior basal  lower lobes. Minimal emphysema both lung apices. 5. Asymmetric haziness in the posterior basal left lower lobe which could be pneumonitis or atelectasis. 6. 1.5 cm hypodensity in the dome of the right lobe of the liver in segment 7, probably a cyst but given patient's cancer history would suggest follow-up MRI without and with contrast to confirm a benign process.  7. Mild splenomegaly. 8. Small hiatal hernia.   Aortic Atherosclerosis (ICD10-I70.0) and Emphysema (ICD10-J43.9).    Echocardiogram (10/07/2023) IMPRESSIONS   1. Left ventricular ejection fraction, by estimation, is 60 to 65%. The left ventricle has normal function. The left ventricle has no regional wall motion abnormalities. Left ventricular diastolic parameters were normal.   2. Right ventricular systolic function is normal. The  right ventricular size is normal.   3. Left atrial size was mildly dilated.   4. The mitral valve is abnormal. Trivial mitral valve regurgitation. No evidence of mitral stenosis.   5. The aortic valve is tricuspid. There is mild calcification of the aortic valve. There is mild thickening of the aortic valve. Aortic valve regurgitation is not visualized. Aortic valve sclerosis is present, with no evidence of aortic valve stenosis.   6. The inferior vena cava is normal in size with greater than 50% respiratory variability, suggesting right atrial pressure of 3 mmHg.   Assessment & Plan:   Problem List Items Addressed This Visit       Cardiovascular and Mediastinum   Atrial fibrillation with rapid ventricular response (HCC)   New onset. Complete initial course of amiodarone and then continue amiodarone 200 mg daily. Continue metoprolol tartrate 25 mg bid for rate control. Continue apixaban 5 mg bid for stroke prevention.      Relevant Medications   losartan-hydrochlorothiazide (HYZAAR) 50-12.5 MG tablet   Essential hypertension   Blood pressure has been elevated the last several days. I will have him  restart the losartan-HCTZ 50-12.5 mg daily.      Relevant Medications   losartan-hydrochlorothiazide (HYZAAR) 50-12.5 MG tablet     Respiratory   Influenza A - Primary   Resolving. I will order a CBC and CMP as recommended by the discharging physician.      Relevant Orders   CBC   Comprehensive metabolic panel     Other   Hypodense mass of liver   Incidental finding on chest CT. I will order an MRI scan as recommended by radiology to further assess.      Relevant Orders   MR LIVER W WO CONTRAST   Severe sepsis (HCC)   Resolved. Vitals now normal. I discussed him allowing enough time for recovery, getting his rest, and eating and drinking well.      Relevant Orders   CBC   Comprehensive metabolic panel    Return in about 4 weeks (around 11/09/2023) for Reassessment.   Loyola Mast, MD

## 2023-10-12 NOTE — Assessment & Plan Note (Signed)
Blood pressure has been elevated the last several days. I will have him restart the losartan-HCTZ 50-12.5 mg daily.

## 2023-10-14 ENCOUNTER — Inpatient Hospital Stay: Payer: Medicare HMO | Admitting: Family Medicine

## 2023-10-24 ENCOUNTER — Encounter: Payer: Self-pay | Admitting: Family Medicine

## 2023-10-27 ENCOUNTER — Encounter: Payer: Self-pay | Admitting: Nurse Practitioner

## 2023-10-27 ENCOUNTER — Ambulatory Visit: Payer: Medicare HMO | Attending: Nurse Practitioner | Admitting: Nurse Practitioner

## 2023-10-27 VITALS — BP 134/74 | HR 61 | Ht 72.0 in | Wt 260.4 lb

## 2023-10-27 DIAGNOSIS — G4733 Obstructive sleep apnea (adult) (pediatric): Secondary | ICD-10-CM | POA: Diagnosis not present

## 2023-10-27 DIAGNOSIS — I480 Paroxysmal atrial fibrillation: Secondary | ICD-10-CM | POA: Diagnosis not present

## 2023-10-27 DIAGNOSIS — E782 Mixed hyperlipidemia: Secondary | ICD-10-CM

## 2023-10-27 DIAGNOSIS — I10 Essential (primary) hypertension: Secondary | ICD-10-CM

## 2023-10-27 MED ORDER — APIXABAN 5 MG PO TABS: 5.0000 mg | ORAL_TABLET | Freq: Two times a day (BID) | ORAL | 1 refills | Status: DC

## 2023-10-27 MED ORDER — AMIODARONE HCL 200 MG PO TABS: ORAL_TABLET | ORAL | 1 refills | Status: DC

## 2023-10-27 MED ORDER — METOPROLOL TARTRATE 25 MG PO TABS: 25.0000 mg | ORAL_TABLET | Freq: Two times a day (BID) | ORAL | 1 refills | Status: DC

## 2023-10-27 NOTE — Progress Notes (Addendum)
 Office Visit    Patient Name: Cody [name] Date of Encounter: 10/27/2023  Primary Care Provider:  Thedora Garnette HERO, MD Primary Cardiologist:  Alvan Ronal BRAVO, MD  Chief Complaint    78 year old male with a history of paroxysmal atrial fibrillation, hypertension, hyperlipidemia, OSA and GERD who presents hospital follow-up related to atrial fibrillation.  Past Medical History    Past Medical History:  Diagnosis Date   Allergy 1960   Arthritis    Basal cell carcinoma 09/12/2017   RIGHT SIDEBURN TX CX3 5FU CAUTERY   Cancer (HCC)    Cataract 2016   Constipation    Environmental allergies    GERD (gastroesophageal reflux disease)    Headache    bad sinus headache   History of shingles    History of skin cancer    Hyperlipidemia    Hypertension 05-25-2022   Nocturia    SCC (squamous cell carcinoma) 01/20/2009   LEFT INNER EAR CX3 5FU   SCC (squamous cell carcinoma) 05/05/2010   LEFT PARASPINAL   SCC (squamous cell carcinoma) 09/12/2017   RIGHT NECK TX CX3 5FU   SCC (squamous cell carcinoma) 09/12/2017   RIGHT UPPER BACK TX CX3 5FU CAUTERY   SCC (squamous cell carcinoma) 10/30/2019   LOWER STERNUM TX CX3 5FU CAUTERY   Sleep apnea    Cpap set 10-16   Sleep apnea, obstructive    cpap   Squamous cell carcinoma of skin 01/20/2009   LEFT LOW PARASPINAL CX3 5FU   Past Surgical History:  Procedure Laterality Date   CATARACT EXTRACTION W/ INTRAOCULAR LENS IMPLANT Bilateral 10/25/2014   cataracts removed   EYE SURGERY  2016   Lense Implants   INTERPHALANGEAL JOINT ARTHROPLASTY Left    Great toe   JOINT REPLACEMENT  2016-2017   Both Hips   TOTAL HIP ARTHROPLASTY Left 09/04/2015   Procedure: LEFT TOTAL HIP ARTHROPLASTY ANTERIOR APPROACH;  Surgeon: Redell Shoals, MD;  Location: [facility];  Service: Orthopedics;  Laterality: Left;   TOTAL HIP ARTHROPLASTY Right 03/25/2016   Procedure: RIGHT TOTAL HIP ARTHROPLASTY ANTERIOR APPROACH;  Surgeon: Redell Shoals, MD;   Location: [facility];  Service: Orthopedics;  Laterality: Right;    Allergies  Allergies  Allergen Reactions   Codeine Nausea And Vomiting and Other (See Comments)    Can't take in large doses     Labs/Other Studies Reviewed    The following studies were reviewed today:  Cardiac Studies & Procedures      ECHOCARDIOGRAM  ECHOCARDIOGRAM COMPLETE 10/07/2023  Narrative ECHOCARDIOGRAM REPORT    Patient Name:   Cody [name] Date of Exam: 10/07/2023 Medical Rec #:  [id number]           Height:       72.0 in Accession #:    [id number]          Weight:       256.2 lb Date of Birth:  Feb 26, 1946           BSA:          2.367 m Patient Age:    77 years            BP:           134/70 mmHg Patient Gender: M                   HR:           79 bpm. Exam Location:  Outpatient  Procedure: 2D Echo,  Cardiac Doppler and Color Doppler  Indications:    A-fib  History:        Patient has no prior history of Echocardiogram examinations. CKD, Arrythmias:Atrial Fibrillation; Risk Factors:Hypertension, Sleep Apnea and Dyslipidemia.  Sonographer:    Tillman Nora RVT RCS Referring Phys: [id number] RHONDA G BARRETT  IMPRESSIONS   1. Left ventricular ejection fraction, by estimation, is 60 to 65%. The left ventricle has normal function. The left ventricle has no regional wall motion abnormalities. Left ventricular diastolic parameters were normal. 2. Right ventricular systolic function is normal. The right ventricular size is normal. 3. Left atrial size was mildly dilated. 4. The mitral valve is abnormal. Trivial mitral valve regurgitation. No evidence of mitral stenosis. 5. The aortic valve is tricuspid. There is mild calcification of the aortic valve. There is mild thickening of the aortic valve. Aortic valve regurgitation is not visualized. Aortic valve sclerosis is present, with no evidence of aortic valve stenosis. 6. The inferior vena cava is normal in size with greater than 50% respiratory  variability, suggesting right atrial pressure of 3 mmHg.  FINDINGS Left Ventricle: Left ventricular ejection fraction, by estimation, is 60 to 65%. The left ventricle has normal function. The left ventricle has no regional wall motion abnormalities. The left ventricular internal cavity size was normal in size. There is no left ventricular hypertrophy. Left ventricular diastolic parameters were normal.  Right Ventricle: The right ventricular size is normal. No increase in right ventricular wall thickness. Right ventricular systolic function is normal.  Left Atrium: Left atrial size was mildly dilated.  Right Atrium: Right atrial size was normal in size.  Pericardium: There is no evidence of pericardial effusion.  Mitral Valve: The mitral valve is abnormal. There is mild thickening of the mitral valve leaflet(s). There is mild calcification of the mitral valve leaflet(s). Trivial mitral valve regurgitation. No evidence of mitral valve stenosis.  Tricuspid Valve: The tricuspid valve is normal in structure. Tricuspid valve regurgitation is trivial. No evidence of tricuspid stenosis.  Aortic Valve: The aortic valve is tricuspid. There is mild calcification of the aortic valve. There is mild thickening of the aortic valve. Aortic valve regurgitation is not visualized. Aortic valve sclerosis is present, with no evidence of aortic valve stenosis. Aortic valve mean gradient measures 2.0 mmHg. Aortic valve peak gradient measures 4.2 mmHg. Aortic valve area, by VTI measures 2.61 cm.  Pulmonic Valve: The pulmonic valve was normal in structure. Pulmonic valve regurgitation is not visualized. No evidence of pulmonic stenosis.  Aorta: The aortic root is normal in size and structure.  Venous: The inferior vena cava is normal in size with greater than 50% respiratory variability, suggesting right atrial pressure of 3 mmHg.  IAS/Shunts: No atrial level shunt detected by color flow Doppler.   LEFT  VENTRICLE PLAX 2D LVIDd:         4.50 cm   Diastology LVIDs:         3.10 cm   LV e' medial:    8.81 cm/s LV PW:         1.00 cm   LV E/e' medial:  9.5 LV IVS:        1.10 cm   LV e' lateral:   8.81 cm/s LVOT diam:     1.80 cm   LV E/e' lateral: 9.5 LV SV:         59 LV SV Index:   25 LVOT Area:     2.54 cm   RIGHT VENTRICLE  IVC RV Basal diam:  3.80 cm     IVC diam: 1.30 cm RV S prime:     10.80 cm/s TAPSE (M-mode): 2.3 cm  LEFT ATRIUM             Index        RIGHT ATRIUM           Index LA diam:        4.00 cm 1.69 cm/m   RA Area:     17.10 cm LA Vol (A2C):   64.0 ml 27.04 ml/m  RA Volume:   49.50 ml  20.91 ml/m LA Vol (A4C):   61.4 ml 25.94 ml/m LA Biplane Vol: 63.7 ml 26.91 ml/m AORTIC VALVE                    PULMONIC VALVE AV Area (Vmax):    2.78 cm     PV Vmax:       0.90 m/s AV Area (Vmean):   2.59 cm     PV Peak grad:  3.2 mmHg AV Area (VTI):     2.61 cm AV Vmax:           102.40 cm/s AV Vmean:          71.600 cm/s AV VTI:            0.227 m AV Peak Grad:      4.2 mmHg AV Mean Grad:      2.0 mmHg LVOT Vmax:         112.00 cm/s LVOT Vmean:        72.950 cm/s LVOT VTI:          0.233 m LVOT/AV VTI ratio: 1.03  AORTA Ao Root diam: 3.00 cm Ao Asc diam:  3.10 cm  MITRAL VALVE MV Area (PHT): 3.61 cm    SHUNTS MV Decel Time: 210 msec    Systemic VTI:  0.23 m MV E velocity: 83.90 cm/s  Systemic Diam: 1.80 cm MV A velocity: 93.90 cm/s MV E/A ratio:  0.89  Maude Emmer MD Electronically signed by Maude Emmer MD Signature Date/Time: 10/07/2023/10:55:53 AM    Final            Recent Labs: 10/06/2023: Magnesium  1.9; TSH 2.774 10/12/2023: ALT 39; BUN 12; Creatinine, Ser 1.20; Hemoglobin 14.7; Platelets 371.0; Potassium 3.7; Sodium 138  Recent Lipid Panel    Component Value Date/Time   CHOL 132 07/05/2023 0849   TRIG 141.0 07/05/2023 0849   HDL 31.40 (L) 07/05/2023 0849   CHOLHDL 4 07/05/2023 0849   VLDL 28.2 07/05/2023 0849    LDLCALC 73 07/05/2023 0849    History of Present Illness    78 year old male with the above past medical history including paroxysmal atrial fibrillation, hypertension, hyperlipidemia, OSA and GERD who presents hospital follow-up related to atrial fibrillation.  He was previously evaluated by cardiology in 2016 in the setting of preoperative cardiovascular assessment through improving history of CAD.  He has not been seen in follow-up since.  He was hospitalized in December 2024 in the setting of sepsis, influenza A.  He developed atrial fibrillation with RVR.  Cardiology was consulted.  He was started on hide [name] amiodarone  and converted to normal sinus rhythm.  This was later converted to oral amiodarone . Additionally, he was started on metoprolol  and Eliquis .  Echocardiogram showed EF 60 to 65%, normal LV function, no RWMA, normal RV systolic function, no significant valvular abnormalities.  He had an incidental finding of  hypodensity on right dome of liver on CT, outpatient MRI was recommended.  He presents today for follow-up accompanied by his wife. Since his hospitalization he has done well from a cardiac standpoint.  He has some mild shortness of breath with activity, on the setting of recent flu, denies any symptoms concerning for angina, denies palpitations, dizziness, edema, PND, orthopnea, weight gain. Overall, he reports feeling well.  Home Medications    Current Outpatient Medications  Medication Sig Dispense Refill   albuterol  (VENTOLIN  HFA) 108 (90 Base) MCG/ACT inhaler Inhale 1-2 puffs into the lungs every 6 (six) hours as needed for wheezing or shortness of breath. 18 g 0   atorvastatin  (LIPITOR) 20 MG tablet Take 1 tablet (20 mg total) by mouth daily after supper. 90 tablet 3   azelastine  (ASTELIN ) 0.1 % nasal spray Place into both nostrils 2 (two) times daily. Use in each nostril as directed     benzonatate  (TESSALON ) 200 MG capsule Take 1 capsule (200 mg total) by mouth 3  (three) times daily as needed for cough. 20 capsule 0   fluticasone  (FLONASE ) 50 MCG/ACT nasal spray Place 2 sprays into both nostrils daily as needed for allergies.      guaiFENesin  (MUCINEX ) 600 MG 12 hr tablet Take 600 mg by mouth as needed for cough or to loosen phlegm.     loratadine (CLARITIN) 10 MG tablet Take 10 mg by mouth daily as needed for allergies.     losartan -hydrochlorothiazide (HYZAAR) 50-12.5 MG tablet Take 1 tablet by mouth daily. 90 tablet 3   Multiple Vitamin (MULTIVITAMIN) tablet Take 1 tablet by mouth daily.     omeprazole  (PRILOSEC ) 20 MG capsule Take 1 capsule (20 mg total) by mouth daily. 90 capsule 3   Phenylephrine-Acetaminophen  (TYLENOL  SINUS CONGESTION/PAIN PO) Take 1 capsule by mouth as needed (sinus).     polyethylene glycol (MIRALAX / GLYCOLAX) 17 g packet Take 17 g by mouth daily.     promethazine -dextromethorphan (PROMETHAZINE -DM) 6.25-15 MG/5ML syrup Take 5 mLs by mouth 3 (three) times daily as needed for cough. 200 mL 0   pseudoephedrine (SUDAFED) 120 MG 12 hr tablet Take 120 mg by mouth 2 (two) times daily.     Wheat Dextrin (BENEFIBER DRINK MIX PO) Take 1 Dose by mouth daily.     amiodarone  (PACERONE ) 200 MG tablet Take 400 mg by mouth twice daily x 10 days, followed by 200 mg by mouth daily 90 tablet 1   apixaban  (ELIQUIS ) 5 MG TABS tablet Take 1 tablet (5 mg total) by mouth 2 (two) times daily. 90 tablet 1   metoprolol  tartrate (LOPRESSOR ) 25 MG tablet Take 1 tablet (25 mg total) by mouth 2 (two) times daily. 90 tablet 1   No current facility-administered medications for this visit.     Review of Systems    He denies chest pain, palpitations, dyspnea, pnd, orthopnea, n, v, dizziness, syncope, edema, weight gain, or early satiety. All other systems reviewed and are otherwise negative except as noted above.   Physical Exam    VS:  BP 134/74   Pulse 61   Ht 6' (1.829 m)   Wt 260 lb 6.4 oz (118.1 kg)   SpO2 97%   BMI 35.32 kg/m   GEN: Well  nourished, well developed, in no acute distress. HEENT: normal. Neck: Supple, no JVD, carotid bruits, or masses. Cardiac: RRR, no murmurs, rubs, or gallops. No clubbing, cyanosis, edema.  Radials/DP/PT 2+ and equal bilaterally.  Respiratory:  Respirations regular and unlabored,  clear to auscultation bilaterally. GI: Soft, nontender, nondistended, BS + x 4. MS: no deformity or atrophy. Skin: warm and dry, no rash. Neuro:  Strength and sensation are intact. Psych: Normal affect.  Accessory Clinical Findings    ECG personally reviewed by me today - EKG Interpretation Date/Time:  Thursday October 27 2023 08:27:54 EST Ventricular Rate:  61 PR Interval:  130 QRS Duration:  82 QT Interval:  422 QTC Calculation: 424 R Axis:   40  Text Interpretation: Normal sinus rhythm Normal ECG When compared with ECG of 05-Oct-2023 06:21, PREVIOUS ECG IS PRESENT Confirmed by Daneen Perkins ([id number]) on 10/27/2023 8:39:11 AM  - no acute changes.   Lab Results  Component Value Date   WBC 10.3 10/12/2023   HGB 14.7 10/12/2023   HCT 42.7 10/12/2023   MCV 85.2 10/12/2023   PLT 371.0 10/12/2023   Lab Results  Component Value Date   CREATININE 1.20 10/12/2023   BUN 12 10/12/2023   NA 138 10/12/2023   K 3.7 10/12/2023   CL 105 10/12/2023   CO2 27 10/12/2023   Lab Results  Component Value Date   ALT 39 10/12/2023   AST 19 10/12/2023   ALKPHOS 66 10/12/2023   BILITOT 0.9 10/12/2023   Lab Results  Component Value Date   CHOL 132 07/05/2023   HDL 31.40 (L) 07/05/2023   LDLCALC 73 07/05/2023   TRIG 141.0 07/05/2023   CHOLHDL 4 07/05/2023    Lab Results  Component Value Date   HGBA1C 5.5 07/05/2023    Assessment & Plan    1. Paroxysmal atrial fibrillation: Newly diagnosed in the setting of sepsis due to influenza A. Converted to NSR with amiodarone . Echo showed EF 60 to 65%, normal LV function, no RWMA, normal RV systolic function, no significant valvular abnormalities. Maintaining NSR. Consider  cardiac monitor in the future for evaluation of recurrent atrial fibrillation. Discussed monitoring with [employer] device. Reviewed ED precautions.  For now continue amiodarone , metoprolol , Eliquis  (CHA2DS2-VASc Score = 3). Will discuss with Dr. Alvan need for cardiac monitor as well as plans for continuation of amiodarone /Eliquis .  ADDENDUM 11/10/2023: Per Dr. Alvan,  with elevated chads2vasc I do continue anticoagulation. Furthermore, he has hx of cancer which can increase risk of stroke.  In terms of amiodarone , I will do 6 weeks if it is  post infection/surgery and they are not otherwise high risk. I like the idea of a 2 week monitor, if no afib, he can stop amio. But I would continue anticoagulation.  2. Hypertension: BP well controlled.  Recently started on losartan -HCTZ for his PCP.  Continue current antihypertensive regimen.   3. Hyperlipidemia: LDL was 73 in 06/2023.  Continue Lipitor.  4. OSA: Adherent to CPAP.   5. Disposition: Follow-up in 2-3 months.       Perkins JAYSON Daneen, NP 10/27/2023, 9:51 AM

## 2023-10-27 NOTE — Patient Instructions (Addendum)
 Medication Instructions:  Your physician recommends that you continue on your current medications as directed. Please refer to the Current Medication list given to you today.  *If you need a refill on your cardiac medications before your next appointment, please call your pharmacy*  Lab Work: None  Follow-Up: At [facility], you and your health needs are our priority.  As part of our continuing mission to provide you with exceptional heart care, we have created designated Provider Care Teams.  These Care Teams include your primary Cardiologist (physician) and Advanced Practice Providers (APPs -  Physician Assistants and Nurse Practitioners) who all work together to provide you with the care you need, when you need it.  Your next appointment:   2 - 3 month(s)  Provider:   Alvan Ronal BRAVO, MD or Damien Braver, NP  Other Instructions See Handouts on [facility] Device and Afib    [facility] [employer]: Website: [email or web address]  YOU HAVE THE OPTION TO PURCHASE   [employer] FROM THE  [employer]  APP PLAY STORE.  THE APP IS FREE , BUT THE  EQUIPMENT HAS A COST. IT ALLOWS YOU TO OBTAIN A RECORDING OF YOUR HEART RATE AND RHYTHM BY PROVIDING A SHORT STRIP THAT YOU CAN SHARE WITH YOUR PROVIDER.     [facility] - sending an EKG Download app and set up profile. Run EKG - by placing 1-2 fingers on the silver plates After EKG is complete - Download PDF  - Skip password (if you apply a password the provider will need it to view the EKG) Click share button (square with upward arrow) in bottom left corner To send: choose MyChart (first time log into MyChart)  Pop up window about sending ECG Click continue Choose type of message Choose provider Type subject and message Click send (EKG should be attached)  - To send additional EKGs in one message click the paperclip image and bottom of page to attach.

## 2023-10-28 ENCOUNTER — Encounter: Payer: Self-pay | Admitting: Family Medicine

## 2023-10-28 DIAGNOSIS — F418 Other specified anxiety disorders: Secondary | ICD-10-CM

## 2023-10-28 MED ORDER — ALPRAZOLAM 0.25 MG PO TABS: ORAL_TABLET | ORAL | 0 refills | Status: DC

## 2023-11-06 ENCOUNTER — Ambulatory Visit (HOSPITAL_BASED_OUTPATIENT_CLINIC_OR_DEPARTMENT_OTHER)
Admission: RE | Admit: 2023-11-06 | Discharge: 2023-11-06 | Disposition: A | Discharge status: Home / Self Care | Payer: Medicare HMO | Source: Ambulatory Visit | Attending: Family Medicine | Admitting: Family Medicine

## 2023-11-06 DIAGNOSIS — R160 Hepatomegaly, not elsewhere classified: Secondary | ICD-10-CM | POA: Diagnosis not present

## 2023-11-06 DIAGNOSIS — Z0389 Encounter for observation for other suspected diseases and conditions ruled out: Secondary | ICD-10-CM | POA: Diagnosis not present

## 2023-11-06 DIAGNOSIS — R932 Abnormal findings on diagnostic imaging of liver and biliary tract: Secondary | ICD-10-CM | POA: Diagnosis not present

## 2023-11-06 DIAGNOSIS — R918 Other nonspecific abnormal finding of lung field: Secondary | ICD-10-CM | POA: Diagnosis not present

## 2023-11-06 MED ORDER — GADOBUTROL 1 MMOL/ML IV SOLN
10.0000 mL | Freq: Once | INTRAVENOUS | Status: AC | PRN
  Administered 2023-11-06: 10 mL via INTRAVENOUS

## 2023-11-14 ENCOUNTER — Encounter: Payer: Self-pay | Admitting: Family Medicine

## 2023-11-16 ENCOUNTER — Ambulatory Visit: Payer: Medicare HMO | Admitting: Family Medicine

## 2023-11-16 ENCOUNTER — Encounter: Payer: Self-pay | Admitting: Family Medicine

## 2023-11-16 VITALS — BP 132/66 | HR 71 | Temp 97.7°F | Ht 72.0 in | Wt 250.8 lb

## 2023-11-16 DIAGNOSIS — E782 Mixed hyperlipidemia: Secondary | ICD-10-CM | POA: Diagnosis not present

## 2023-11-16 DIAGNOSIS — I10 Essential (primary) hypertension: Secondary | ICD-10-CM

## 2023-11-16 DIAGNOSIS — I4891 Unspecified atrial fibrillation: Secondary | ICD-10-CM | POA: Diagnosis not present

## 2023-11-16 DIAGNOSIS — K7689 Other specified diseases of liver: Secondary | ICD-10-CM | POA: Diagnosis not present

## 2023-11-16 NOTE — Assessment & Plan Note (Signed)
Lipids at goal. Continue atorvastatin 20 mg daily.

## 2023-11-16 NOTE — Assessment & Plan Note (Signed)
MRI is reassuring that this is a benign cyst.

## 2023-11-16 NOTE — Assessment & Plan Note (Signed)
Blood pressure improved. Continue losartan-HCTZ (Hyzaar) 50-12.5 mg daily.

## 2023-11-16 NOTE — Assessment & Plan Note (Signed)
Recent onset. Continue amiodarone 200 mg daily and metoprolol tartrate 25 mg bid. Continue apixaban 5 mg bid for stroke prevention.

## 2023-11-16 NOTE — Progress Notes (Signed)
[facility] PRIMARY CARE [facility] PRIMARY Trecia Rogers [facility] [address] [place] [address] Dept: [phone] Dept Fax: [phone]  Chronic Care Office Visit  Subjective:    Patient ID: Cody [name], male    DOB: December 18, 1945, 78 y.o..   MRN: [id number]  Chief Complaint  Patient presents with   Hypertension    4 week f/u HTN.    History of Present Illness:  Patient is in today for reassessment of chronic medical issues.  Mr. Mais has a history of hypertension. He is managed on losartan-HCTZ (Hyzaar) 50-12.5 mg daily. He also has had some moderate chronic kidney disease, though his last eGFR was 59.  Mr. Gaebler developed new-onset atrial fibrillation during a hospitalization for sepsis and influenza in early December. He is currently managed on amiodarone 200 mg daily for rhythm control, metoprolol tartrate 25 mg bid for rate control, and apixaban 5 mg bid for stroke prevention. He denies any current episodes of tachycardia or palpitations.   Mr. Pfenning has a history of hyperlipidemia. He is managed on atorvastatin 20 mg daily.  Past Medical History: Patient Active Problem List   Diagnosis Date Noted   Simple hepatic cyst 10/12/2023   Atrial fibrillation with rapid ventricular response (HCC) 10/06/2023   Severe sepsis (HCC) 10/06/2023   Actinic keratoses 07/05/2023   Diastasis recti 07/05/2023   Neurosensory deafness 07/05/2023   Osteoarthritis of multiple joints 07/05/2023   Essential hypertension 04/19/2023   GERD (gastroesophageal reflux disease) 04/19/2023   Class 2 obesity due to excess calories with body mass index (BMI) of 35.0 to 35.9 in adult 04/19/2023   CKD (chronic kidney disease) stage 3, GFR 30-59 ml/min (HCC) 04/19/2023   Prediabetes 04/19/2023   OSA (obstructive sleep apnea) 06/29/2017   HLD (hyperlipidemia) 06/20/2015   Seasonal allergic rhinitis 01/21/2009   Past Surgical History:  Procedure Laterality Date   CATARACT EXTRACTION W/  INTRAOCULAR LENS IMPLANT Bilateral 10/25/2014   cataracts removed   EYE SURGERY  2016   Lense Implants   INTERPHALANGEAL JOINT ARTHROPLASTY Left    Great toe   JOINT REPLACEMENT  2016-2017   Both Hips   TOTAL HIP ARTHROPLASTY Left 09/04/2015   Procedure: LEFT TOTAL HIP ARTHROPLASTY ANTERIOR APPROACH;  Surgeon: Samson Frederic, MD;  Location: [facility];  Service: Orthopedics;  Laterality: Left;   TOTAL HIP ARTHROPLASTY Right 03/25/2016   Procedure: RIGHT TOTAL HIP ARTHROPLASTY ANTERIOR APPROACH;  Surgeon: Samson Frederic, MD;  Location: [facility];  Service: Orthopedics;  Laterality: Right;   Family History  Problem Relation Age of Onset   Heart failure Mother    Heart Problems Mother        pacer   Alcohol abuse Mother    Arthritis Mother    Drug abuse Mother    Stroke Father    Hearing loss Father    Fragile X syndrome Father    Hypertension Sister    Other Sister        healthty   Other Son        healthy   Other Son        healthy   Outpatient Medications Prior to Visit  Medication Sig Dispense Refill   ALPRAZolam (XANAX) 0.25 MG tablet Take 1 tablet (0.25 mg total) by mouth 15 minutes prior to your procedure. 1 tablet 0   amiodarone (PACERONE) 200 MG tablet Take 400 mg by mouth twice daily x 10 days, followed by 200 mg by mouth daily 90 tablet 1   apixaban (ELIQUIS) 5  MG TABS tablet Take 1 tablet (5 mg total) by mouth 2 (two) times daily. 90 tablet 1   atorvastatin (LIPITOR) 20 MG tablet Take 1 tablet (20 mg total) by mouth daily after supper. 90 tablet 3   azelastine (ASTELIN) 0.1 % nasal spray Place into both nostrils 2 (two) times daily. Use in each nostril as directed     fluticasone (FLONASE) 50 MCG/ACT nasal spray Place 2 sprays into both nostrils daily as needed for allergies.      loratadine (CLARITIN) 10 MG tablet Take 10 mg by mouth daily as needed for allergies.     losartan-hydrochlorothiazide (HYZAAR) 50-12.5 MG tablet Take 1 tablet by mouth daily. 90 tablet 3    metoprolol tartrate (LOPRESSOR) 25 MG tablet Take 1 tablet (25 mg total) by mouth 2 (two) times daily. 90 tablet 1   Multiple Vitamin (MULTIVITAMIN) tablet Take 1 tablet by mouth daily.     omeprazole (PRILOSEC) 20 MG capsule Take 1 capsule (20 mg total) by mouth daily. 90 capsule 3   Phenylephrine-Acetaminophen (TYLENOL SINUS CONGESTION/PAIN PO) Take 1 capsule by mouth as needed (sinus).     polyethylene glycol (MIRALAX / GLYCOLAX) 17 g packet Take 17 g by mouth daily.     promethazine-dextromethorphan (PROMETHAZINE-DM) 6.25-15 MG/5ML syrup Take 5 mLs by mouth 3 (three) times daily as needed for cough. 200 mL 0   pseudoephedrine (SUDAFED) 120 MG 12 hr tablet Take 120 mg by mouth 2 (two) times daily.     Wheat Dextrin (BENEFIBER DRINK MIX PO) Take 1 Dose by mouth daily.     benzonatate (TESSALON) 200 MG capsule Take 1 capsule (200 mg total) by mouth 3 (three) times daily as needed for cough. 20 capsule 0   albuterol (VENTOLIN HFA) 108 (90 Base) MCG/ACT inhaler Inhale 1-2 puffs into the lungs every 6 (six) hours as needed for wheezing or shortness of breath. (Patient not taking: Reported on 11/16/2023) 18 g 0   guaiFENesin (MUCINEX) 600 MG 12 hr tablet Take 600 mg by mouth as needed for cough or to loosen phlegm. (Patient not taking: Reported on 11/16/2023)     No facility-administered medications prior to visit.   Allergies  Allergen Reactions   Codeine Nausea And Vomiting and Other (See Comments)    "Can't take in large doses"   Objective:   Today's Vitals   11/16/23 0922  BP: 132/66  Pulse: 71  Temp: 97.7 F (36.5 C)  TempSrc: Temporal  SpO2: 97%  Weight: 250 lb 12.8 oz (113.8 kg)  Height: 6' (1.829 m)   Body mass index is 34.01 kg/m.   General: Well developed, well nourished. No acute distress. Psych: Alert and oriented. Normal mood and affect.  There are no preventive care reminders to display for this patient.  Lab Results    Latest Ref Rng & Units 10/12/2023   11:50 AM  10/06/2023    4:29 AM 10/05/2023    1:57 AM  CBC  WBC 4.0 - 10.5 K/uL 10.3  8.9  9.0   Hemoglobin 13.0 - 17.0 g/dL 69.6  29.5  28.4   Hematocrit 39.0 - 52.0 % 42.7  37.1  40.2   Platelets 150.0 - 400.0 K/uL 371.0  151  160        Latest Ref Rng & Units 10/12/2023   11:50 AM 10/06/2023    4:29 AM 10/05/2023    1:57 AM  CMP  Glucose 70 - 99 mg/dL 85  132  440   BUN 6 -  23 mg/dL 12  11  11    Creatinine 0.40 - 1.50 mg/dL 6.29  5.28  4.13   Sodium 135 - 145 mEq/L 138  136  132   Potassium 3.5 - 5.1 mEq/L 3.7  3.6  3.2   Chloride 96 - 112 mEq/L 105  105  101   CO2 19 - 32 mEq/L 27  20  23    Calcium 8.4 - 10.5 mg/dL 9.1  8.5  8.4   Total Protein 6.0 - 8.3 g/dL 7.0  5.5  6.7   Total Bilirubin 0.2 - 1.2 mg/dL 0.9  1.2  1.3   Alkaline Phos 39 - 117 U/L 66  44  63   AST 0 - 37 U/L 19  31  31    ALT 0 - 53 U/L 39  25  31    Imaging: MR of Liver w and wo contrast (11/06/2023) IMPRESSION: 1. The abnormality seen on the prior chest CT corresponds to a 12 mm simple hepatic cyst in segment 7. No worrisome hepatic lesions are identified. 2. No acute abdominal findings, mass lesions or adenopathy.    Assessment & Plan:   Problem List Items Addressed This Visit       Cardiovascular and Mediastinum   Atrial fibrillation with rapid ventricular response (HCC)   Recent onset. Continue amiodarone 200 mg daily and metoprolol tartrate 25 mg bid. Continue apixaban 5 mg bid for stroke prevention.      Essential hypertension - Primary   Blood pressure improved. Continue losartan-HCTZ (Hyzaar) 50-12.5 mg daily.        Digestive   Simple hepatic cyst   MRI is reassuring that this is a benign cyst.        Other   Hyperlipidemia   Lipids at goal. Continue atorvastatin 20 mg daily.       Return in about 3 months (around 02/14/2024).   Loyola Mast, MD

## 2023-11-25 DIAGNOSIS — G4733 Obstructive sleep apnea (adult) (pediatric): Secondary | ICD-10-CM | POA: Diagnosis not present

## 2023-12-05 ENCOUNTER — Telehealth: Payer: Self-pay | Admitting: Internal Medicine

## 2023-12-05 ENCOUNTER — Other Ambulatory Visit (HOSPITAL_COMMUNITY): Payer: Self-pay

## 2023-12-05 MED ORDER — APIXABAN 5 MG PO TABS
5.0000 mg | ORAL_TABLET | Freq: Two times a day (BID) | ORAL | 5 refills | Status: DC
  Filled 2023-12-05: qty 60, 30d supply, fill #0

## 2023-12-05 NOTE — Telephone Encounter (Signed)
*  STAT* If patient is at the pharmacy, call can be transferred to refill team.   1. Which medications need to be refilled? (please list name of each medication and dose if known)   apixaban  (ELIQUIS ) 5 MG TABS tablet    2. Which pharmacy/location (including street and city if local pharmacy) is medication to be sent to?  [id number] - [place], [place] - [address]    3. Do they need a 30 day or 90 day supply? 30

## 2023-12-05 NOTE — Telephone Encounter (Signed)
 Prescription refill request for Eliquis  received. Indication: PAF Last office visit: 10/27/23  E Monge NP Scr: 1.20 on 10/12/23  Epic Age: 78 Weight: 118.1kg  Based on above findings Eliquis  5mg  twice daily is the appropriate dose.  Reill approved.

## 2023-12-06 ENCOUNTER — Other Ambulatory Visit (HOSPITAL_COMMUNITY): Payer: Self-pay

## 2023-12-23 ENCOUNTER — Encounter: Payer: Self-pay | Admitting: Internal Medicine

## 2023-12-26 ENCOUNTER — Ambulatory Visit: Payer: Medicare HMO | Attending: Internal Medicine | Admitting: Internal Medicine

## 2023-12-26 ENCOUNTER — Telehealth: Payer: Self-pay

## 2023-12-26 VITALS — BP 156/78 | Ht 72.0 in

## 2023-12-26 DIAGNOSIS — E782 Mixed hyperlipidemia: Secondary | ICD-10-CM

## 2023-12-26 DIAGNOSIS — I5043 Acute on chronic combined systolic (congestive) and diastolic (congestive) heart failure: Secondary | ICD-10-CM | POA: Diagnosis not present

## 2023-12-26 DIAGNOSIS — I110 Hypertensive heart disease with heart failure: Secondary | ICD-10-CM | POA: Diagnosis not present

## 2023-12-26 DIAGNOSIS — I10 Essential (primary) hypertension: Secondary | ICD-10-CM

## 2023-12-26 DIAGNOSIS — I480 Paroxysmal atrial fibrillation: Secondary | ICD-10-CM

## 2023-12-26 NOTE — Progress Notes (Signed)
  Cardiology Office Note:  .   Date:  12/26/2023  ID:  Cody [name], DOB [birth year], MRN [id number] PCP: Loyola Mast, MD  Cody [name] Providers Cardiologist:  Maisie Fus, MD    History of Present Illness: .   [name] is a 78 y.o. male with hx of OSA on CPAP, GERD, HTN and HLD who is being seen as a FU for onset of Afib.  Virtual Visit He was hospitalized in December of 2024 with influenza A. He developed new onset rapid afib. He was started on [name] amiodarone.He was transitioned to oral amio after he converted to sinus rhythm. He received an echo as an outpatient that showed EF 60-65%, only mildly dilated LA, no significant valve disease. Today he is doing well. Upon questioning of symptoms he notes mild chest pressure not necessarily related to activity. Otherwise, he is doing well. He has alivecor and has had no signs of afib.  ROS:  per HPI otherwise negative   Studies Reviewed: .        NA Risk Assessment/Calculations:    CHA2DS2-VASc Score = 3   This indicates a 3.2% annual risk of stroke. The patient's score is based upon: CHF History: 0 HTN History: 1 Diabetes History: 0 Stroke History: 0 Vascular Disease History: 0 Age Score: 2 Gender Score: 0         Physical Exam:   VS:  BP (!) 156/78 (Cuff Size: Normal)   Ht 6' (1.829 m)   BMI 34.01 kg/m    Wt Readings from Last 3 Encounters:  11/16/23 250 lb 12.8 oz (113.8 kg)  10/27/23 260 lb 6.4 oz (118.1 kg)  10/12/23 254 lb 9.6 oz (115.5 kg)    Well appearing MMM Normal WOB No abdominal distension Normal Affect    ASSESSMENT AND PLAN: .   Paroxysmal Afib. New diagnosis Dec 2024 in the setting of influenza. Monitoring with [employer] -continue amiodarone 200 mg daily (normal TSH, liver fxn) -continue metop tartrate 25 mg BID -cont eliquis 5 mg BID -->if on follow up he continues to not have recurrence of afib, can consider stopping his amiodarone. With elevated chads2vasc, would  continue AC  Mild Chest pressure/CAC Has CAC in the LAD/RCA from CT PE, non gated study -if progressive, can plan for a lexiscan myoview stress test and nitroglycerin PRN - will continue to medically manage on BB. On eliquis so no asa. Continue statin.  -had stress in 2009 that was normal  HTN Ok control Continue hyzaar 50-12.5 mg daily      Virtual Visit via Video Note  I connected with Cody [name] on 12/26/23 at 11:00 AM EST by a video enabled telemedicine application and verified that I am speaking with the correct person using two identifiers.  I discussed the limitations of evaluation and management by telemedicine and the availability of in person appointments. The patient expressed understanding and agreed to proceed.  I discussed the assessment and treatment plan with the patient. The patient was provided an opportunity to ask questions and all were answered. The patient agreed with the plan and demonstrated an understanding of the instructions.   The patient was advised to call back or seek an in-person evaluation if the symptoms worsen or if the condition fails to improve as anticipated.  I provided 15 minutes of non-face-to-face time during this encounter.   Dispo: Follow up in 6 months  Signed, Shamarie Call, Alben Spittle, MD

## 2023-12-26 NOTE — Patient Instructions (Signed)
 Medication Instructions:  Your physician recommends that you continue on your current medications as directed. Please refer to the Current Medication list given to you today.  *If you need a refill on your cardiac medications before your next appointment, please call your pharmacy*   Lab Work: TSH and CMP before next appointment If you have labs (blood work) drawn today and your tests are completely normal, you will receive your results only by: MyChart Message (if you have MyChart) OR A paper copy in the mail If you have any lab test that is abnormal or we need to change your treatment, we will call you to review the results.   Follow-Up: At [facility], you and your health needs are our priority.  As part of our continuing mission to provide you with exceptional heart care, we have created designated Provider Care Teams.  These Care Teams include your primary Cardiologist (physician) and Advanced Practice Providers (APPs -  Physician Assistants and Nurse Practitioners) who all work together to provide you with the care you need, when you need it.  We recommend signing up for the patient portal called "MyChart".  Sign up information is provided on this After Visit Summary.  MyChart is used to connect with patients for Virtual Visits (Telemedicine).  Patients are able to view lab/test results, encounter notes, upcoming appointments, etc.  Non-urgent messages can be sent to your provider as well.   To learn more about what you can do with MyChart, go to [email or web address].    Your next appointment:   6 month(s)  Provider:   Theodore Demark, PA-C, Edd Fabian, FNP, Micah Flesher, PA-C, Marjie Skiff, PA-C, Robet Leu, PA-C, Juanda Crumble, PA-C, Joni Reining, DNP, ANP, Azalee Course, PA-C, Bernadene Person, NP, or Reather Littler, NP

## 2023-12-26 NOTE — Telephone Encounter (Signed)
  Patient Consent for Virtual Visit        Cody [name] has provided verbal consent on 12/26/2023 for a virtual visit (video or telephone).   CONSENT FOR VIRTUAL VISIT FOR:  Cody [name]  By participating in this virtual visit I agree to the following:  I hereby voluntarily request, consent and authorize [facility] and its employed or contracted physicians, physician assistants, nurse practitioners or other licensed health care professionals (the Practitioner), to provide me with telemedicine health care services (the "Services") as deemed necessary by the treating Practitioner. I acknowledge and consent to receive the Services by the Practitioner via telemedicine. I understand that the telemedicine visit will involve communicating with the Practitioner through live audiovisual communication technology and the disclosure of certain medical information by electronic transmission. I acknowledge that I have been given the opportunity to request an in-person assessment or other available alternative prior to the telemedicine visit and am voluntarily participating in the telemedicine visit.  I understand that I have the right to withhold or withdraw my consent to the use of telemedicine in the course of my care at any time, without affecting my right to future care or treatment, and that the Practitioner or I may terminate the telemedicine visit at any time. I understand that I have the right to inspect all information obtained and/or recorded in the course of the telemedicine visit and may receive copies of available information for a reasonable fee.  I understand that some of the potential risks of receiving the Services via telemedicine include:  Delay or interruption in medical evaluation due to technological equipment failure or disruption; Information transmitted may not be sufficient (e.g. poor resolution of images) to allow for appropriate medical decision making by the  Practitioner; and/or  In rare instances, security protocols could fail, causing a breach of personal health information.  Furthermore, I acknowledge that it is my responsibility to provide information about my medical history, conditions and care that is complete and accurate to the best of my ability. I acknowledge that Practitioner's advice, recommendations, and/or decision may be based on factors not within their control, such as incomplete or inaccurate data provided by me or distortions of diagnostic images or specimens that may result from electronic transmissions. I understand that the practice of medicine is not an exact science and that Practitioner makes no warranties or guarantees regarding treatment outcomes. I acknowledge that a copy of this consent can be made available to me via my patient portal [facility] MyChart), or I can request a printed copy by calling the office of [facility].    I understand that my insurance will be billed for this visit.   I have read or had this consent read to me. I understand the contents of this consent, which adequately explains the benefits and risks of the Services being provided via telemedicine.  I have been provided ample opportunity to ask questions regarding this consent and the Services and have had my questions answered to my satisfaction. I give my informed consent for the services to be provided through the use of telemedicine in my medical care

## 2024-01-01 ENCOUNTER — Encounter (HOSPITAL_COMMUNITY): Payer: Self-pay

## 2024-01-02 ENCOUNTER — Other Ambulatory Visit (HOSPITAL_COMMUNITY): Payer: Self-pay

## 2024-01-02 ENCOUNTER — Ambulatory Visit: Payer: Medicare HMO | Admitting: Family Medicine

## 2024-01-20 ENCOUNTER — Other Ambulatory Visit: Payer: Self-pay | Admitting: Nurse Practitioner

## 2024-02-09 ENCOUNTER — Other Ambulatory Visit: Payer: Self-pay | Admitting: Nurse Practitioner

## 2024-02-09 NOTE — Telephone Encounter (Signed)
 Prescription refill request for Eliquis received. Indication: PAF Last office visit: 10/27/23  E Monge NP Scr: 1.20 on 10/12/23  Epic Age: 78 Weight: 118.1kg  Based on above findings Eliquis 5mg  twice daily is the appropriate dose.  Refill approved.

## 2024-02-14 ENCOUNTER — Ambulatory Visit (INDEPENDENT_AMBULATORY_CARE_PROVIDER_SITE_OTHER): Payer: Medicare HMO | Admitting: Family Medicine

## 2024-02-14 ENCOUNTER — Encounter: Payer: Self-pay | Admitting: Family Medicine

## 2024-02-14 VITALS — BP 132/70 | HR 57 | Temp 97.5°F | Ht 72.0 in | Wt 266.6 lb

## 2024-02-14 DIAGNOSIS — E782 Mixed hyperlipidemia: Secondary | ICD-10-CM | POA: Diagnosis not present

## 2024-02-14 DIAGNOSIS — J301 Allergic rhinitis due to pollen: Secondary | ICD-10-CM

## 2024-02-14 DIAGNOSIS — I10 Essential (primary) hypertension: Secondary | ICD-10-CM | POA: Diagnosis not present

## 2024-02-14 DIAGNOSIS — I4891 Unspecified atrial fibrillation: Secondary | ICD-10-CM

## 2024-02-14 MED ORDER — AZELASTINE HCL 0.1 % NA SOLN: 2.0000 | Freq: Two times a day (BID) | NASAL | 11 refills | Status: AC

## 2024-02-14 MED ORDER — FLUTICASONE PROPIONATE 50 MCG/ACT NA SUSP: 2.0000 | Freq: Every day | NASAL | 11 refills | Status: AC | PRN

## 2024-02-14 NOTE — Assessment & Plan Note (Signed)
Lipids at goal. Continue atorvastatin 20 mg daily.

## 2024-02-14 NOTE — Assessment & Plan Note (Signed)
 Stable. Continue loratadine 10 mg daily, fluticasone  nasal spray, and azelastine  nasal spray.

## 2024-02-14 NOTE — Progress Notes (Signed)
 [facility] PRIMARY CARE [facility] PRIMARY [address] [facility] [address] [place] [address] Dept: [phone] Dept Fax: [phone]  Chronic Care Office Visit  Subjective:    Patient ID: Cody [name], male    DOB: [birth year], 78 y.o..   MRN: [id number]  Chief Complaint  Patient presents with   Hypertension    3 month f/u HTN.     History of Present Illness:  Patient is in today for reassessment of chronic medical issues.  Cody [name] has a history of hypertension. He is managed on losartan -HCTZ (Hyzaar) 50-12.5 mg daily. He also has had some moderate chronic kidney disease, though his last eGFR was 59.   Cody [name] developed new-onset atrial fibrillation during a hospitalization for sepsis and influenza in early December 2024. He is currently managed on amiodarone  200 mg daily for rhythm control, metoprolol  tartrate 25 mg bid for rate control, and apixaban  5 mg bid for stroke prevention. He denies any current episodes of tachycardia or palpitations.   Cody [name] has a history of hyperlipidemia. He is managed on atorvastatin  20 mg daily.  Cody [name] has a history of allergic rhinitis. He manages this seasonally with loratadine 10 mg daily, fluticasone  nasal spray, and azelastine  nasal spray. He notes this makes his symptoms tolerable.  Past Medical History: Patient Active Problem List   Diagnosis Date Noted   Simple hepatic cyst 10/12/2023   Atrial fibrillation with rapid ventricular response (HCC) 10/06/2023   Severe sepsis (HCC) 10/06/2023   Actinic keratoses 07/05/2023   Diastasis recti 07/05/2023   Neurosensory deafness 07/05/2023   Osteoarthritis of multiple joints 07/05/2023   Essential hypertension 04/19/2023   GERD (gastroesophageal reflux disease) 04/19/2023   Class 2 obesity due to excess calories with body mass index (BMI) of 35.0 to 35.9 in adult 04/19/2023   CKD (chronic kidney disease) stage 3, GFR 30-59 ml/min (HCC) 04/19/2023   Prediabetes 04/19/2023    OSA (obstructive sleep apnea) 06/29/2017   Hyperlipidemia 06/20/2015   Seasonal allergic rhinitis 01/21/2009   Past Surgical History:  Procedure Laterality Date   CATARACT EXTRACTION W/ INTRAOCULAR LENS IMPLANT Bilateral 10/25/2014   cataracts removed   EYE SURGERY  2016   Lense Implants   INTERPHALANGEAL JOINT ARTHROPLASTY Left    Great toe   JOINT REPLACEMENT  2016-2017   Both Hips   TOTAL HIP ARTHROPLASTY Left 09/04/2015   Procedure: LEFT TOTAL HIP ARTHROPLASTY ANTERIOR APPROACH;  Surgeon: Adonica Hoose, MD;  Location: [facility];  Service: Orthopedics;  Laterality: Left;   TOTAL HIP ARTHROPLASTY Right 03/25/2016   Procedure: RIGHT TOTAL HIP ARTHROPLASTY ANTERIOR APPROACH;  Surgeon: Adonica Hoose, MD;  Location: [facility];  Service: Orthopedics;  Laterality: Right;   Family History  Problem Relation Age of Onset   Heart failure Mother    Heart Problems Mother        pacer   Alcohol  abuse Mother    Arthritis Mother    Drug abuse Mother    Stroke Father    Hearing loss Father    Fragile X syndrome Father    Hypertension Sister    Other Sister        healthty   Other Son        healthy   Other Son        healthy   Outpatient Medications Prior to Visit  Medication Sig Dispense Refill   amiodarone  (PACERONE ) 200 MG tablet Take 400 mg by mouth twice daily x 10 days, followed by 200 mg by  mouth daily 90 tablet 1   apixaban  (ELIQUIS ) 5 MG TABS tablet TAKE 1 TABLET BY MOUTH TWICE A DAY 180 tablet 1   atorvastatin  (LIPITOR) 20 MG tablet Take 1 tablet (20 mg total) by mouth daily after supper. 90 tablet 3   loratadine (CLARITIN) 10 MG tablet Take 10 mg by mouth daily as needed for allergies.     losartan -hydrochlorothiazide (HYZAAR) 50-12.5 MG tablet Take 1 tablet by mouth daily. 90 tablet 3   metoprolol  tartrate (LOPRESSOR ) 25 MG tablet TAKE 1 TABLET BY MOUTH TWICE A DAY 180 tablet 3   Multiple Vitamin (MULTIVITAMIN) tablet Take 1 tablet by mouth daily.     omeprazole  (PRILOSEC )  20 MG capsule Take 1 capsule (20 mg total) by mouth daily. 90 capsule 3   Phenylephrine-Acetaminophen  (TYLENOL  SINUS CONGESTION/PAIN PO) Take 1 capsule by mouth as needed (sinus).     polyethylene glycol (MIRALAX / GLYCOLAX) 17 g packet Take 17 g by mouth daily.     pseudoephedrine (SUDAFED) 120 MG 12 hr tablet Take 120 mg by mouth 2 (two) times daily.     Wheat Dextrin (BENEFIBER DRINK MIX PO) Take 1 Dose by mouth daily.     azelastine  (ASTELIN ) 0.1 % nasal spray Place into both nostrils 2 (two) times daily. Use in each nostril as directed     fluticasone  (FLONASE ) 50 MCG/ACT nasal spray Place 2 sprays into both nostrils daily as needed for allergies.      ALPRAZolam  (XANAX ) 0.25 MG tablet Take 1 tablet (0.25 mg total) by mouth 15 minutes prior to your procedure. (Patient not taking: Reported on 12/26/2023) 1 tablet 0   No facility-administered medications prior to visit.   Allergies  Allergen Reactions   Codeine Nausea And Vomiting and Other (See Comments)    "Can't take in large doses"   Objective:   Today's Vitals   02/14/24 0901  BP: 132/70  Pulse: (!) 57  Temp: (!) 97.5 F (36.4 C)  TempSrc: Temporal  SpO2: 98%  Weight: 266 lb 9.6 oz (120.9 kg)  Height: 6' (1.829 m)   Body mass index is 36.16 kg/m.   General: Well developed, well nourished. No acute distress. Psych: Alert and oriented. Normal mood and affect.  Health Maintenance Due  Topic Date Due   Zoster Vaccines- Shingrix (2 of 2) 05/24/2011     Assessment & Plan:   Problem List Items Addressed This Visit       Cardiovascular and Mediastinum   Atrial fibrillation with rapid ventricular response (HCC)   Stable. Continue amiodarone  200 mg daily and metoprolol  tartrate 25 mg bid. Continue apixaban  5 mg bid for stroke prevention. Cardiology requests a TSH and CMP before he is seen next  Sept.      Essential hypertension - Primary   Blood pressure adequately controlled. Continue losartan -HCTZ (Hyzaar) 50-12.5 mg  daily.        Respiratory   Seasonal allergic rhinitis   Stable. Continue loratadine 10 mg daily, fluticasone  nasal spray, and azelastine  nasal spray.      Relevant Medications   azelastine  (ASTELIN ) 0.1 % nasal spray   fluticasone  (FLONASE ) 50 MCG/ACT nasal spray     Other   Hyperlipidemia   Lipids at goal. Continue atorvastatin  20 mg daily.       Return in about 4 months (around 06/15/2024) for Annual preventative care.   Graig Lawyer, MD

## 2024-02-14 NOTE — Assessment & Plan Note (Signed)
 Blood pressure adequately controlled. Continue losartan -HCTZ (Hyzaar) 50-12.5 mg daily.

## 2024-02-14 NOTE — Assessment & Plan Note (Signed)
 Stable. Continue amiodarone  200 mg daily and metoprolol  tartrate 25 mg bid. Continue apixaban  5 mg bid for stroke prevention. Cardiology requests a TSH and CMP before he is seen next  Sept.

## 2024-02-29 DIAGNOSIS — G4733 Obstructive sleep apnea (adult) (pediatric): Secondary | ICD-10-CM | POA: Diagnosis not present

## 2024-04-11 DIAGNOSIS — G4733 Obstructive sleep apnea (adult) (pediatric): Secondary | ICD-10-CM | POA: Diagnosis not present

## 2024-04-26 ENCOUNTER — Other Ambulatory Visit: Payer: Self-pay

## 2024-04-26 MED ORDER — AMIODARONE HCL 200 MG PO TABS: 200.0000 mg | ORAL_TABLET | Freq: Every day | ORAL | 2 refills | Status: DC

## 2024-05-19 ENCOUNTER — Other Ambulatory Visit: Payer: Self-pay

## 2024-05-19 ENCOUNTER — Ambulatory Visit: Admission: EM | Admit: 2024-05-19 | Discharge: 2024-05-19 | Disposition: A | Discharge status: Home / Self Care

## 2024-05-19 DIAGNOSIS — R238 Other skin changes: Secondary | ICD-10-CM | POA: Diagnosis not present

## 2024-05-19 NOTE — ED Provider Notes (Signed)
 [facility]    CSN: [id number] Arrival date & time: 05/19/24  1141      History   Chief Complaint Chief Complaint  Patient presents with   Insect Bite    HPI Cody [name] is a 78 y.o. male.   HPI Pt presents today for concerns of a lesion on the head  He initially thought this was a bug bite but it was not improving over the last 10 days  He states there has been some burning in the area and radiates down the side of his face - this has occurred 2-3 times since he noticed the area  He denies drainage or bleeding from the area  He states that it has not progressed or worsened but has not improved either and then it started hurting again today    Past Medical History:  Diagnosis Date   Allergy 1960   Arthritis    Basal cell carcinoma 09/12/2017   RIGHT SIDEBURN TX CX3 5FU CAUTERY   Cancer (HCC)    Cataract 2016   Constipation    Environmental allergies    GERD (gastroesophageal reflux disease)    Headache    bad sinus headache   History of shingles    History of skin cancer    Hyperlipidemia    Hypertension 05-25-2022   Nocturia    SCC (squamous cell carcinoma) 01/20/2009   LEFT INNER EAR CX3 5FU   SCC (squamous cell carcinoma) 05/05/2010   LEFT PARASPINAL   SCC (squamous cell carcinoma) 09/12/2017   RIGHT NECK TX CX3 5FU   SCC (squamous cell carcinoma) 09/12/2017   RIGHT UPPER BACK TX CX3 5FU CAUTERY   SCC (squamous cell carcinoma) 10/30/2019   LOWER STERNUM TX CX3 5FU CAUTERY   Sleep apnea    Cpap set 10-16   Sleep apnea, obstructive    cpap   Squamous cell carcinoma of skin 01/20/2009   LEFT LOW PARASPINAL CX3 5FU    Patient Active Problem List   Diagnosis Date Noted   Simple hepatic cyst 10/12/2023   Atrial fibrillation with rapid ventricular response (HCC) 10/06/2023   Severe sepsis (HCC) 10/06/2023   Actinic keratoses 07/05/2023   Diastasis recti 07/05/2023   Neurosensory deafness 07/05/2023   Osteoarthritis of multiple  joints 07/05/2023   Essential hypertension 04/19/2023   GERD (gastroesophageal reflux disease) 04/19/2023   Class 2 obesity due to excess calories with body mass index (BMI) of 35.0 to 35.9 in adult 04/19/2023   CKD (chronic kidney disease) stage 3, GFR 30-59 ml/min (HCC) 04/19/2023   Prediabetes 04/19/2023   OSA (obstructive sleep apnea) 06/29/2017   Hyperlipidemia 06/20/2015   Seasonal allergic rhinitis 01/21/2009    Past Surgical History:  Procedure Laterality Date   CATARACT EXTRACTION W/ INTRAOCULAR LENS IMPLANT Bilateral 10/25/2014   cataracts removed   EYE SURGERY  2016   Lense Implants   INTERPHALANGEAL JOINT ARTHROPLASTY Left    Great toe   JOINT REPLACEMENT  2016-2017   Both Hips   TOTAL HIP ARTHROPLASTY Left 09/04/2015   Procedure: LEFT TOTAL HIP ARTHROPLASTY ANTERIOR APPROACH;  Surgeon: Redell Shoals, MD;  Location: [facility];  Service: Orthopedics;  Laterality: Left;   TOTAL HIP ARTHROPLASTY Right 03/25/2016   Procedure: RIGHT TOTAL HIP ARTHROPLASTY ANTERIOR APPROACH;  Surgeon: Redell Shoals, MD;  Location: [facility];  Service: Orthopedics;  Laterality: Right;       Home Medications    Prior to Admission medications   Medication Sig Start Date End Date  Taking? Authorizing Provider  amiodarone  (PACERONE ) 200 MG tablet Take 1 tablet (200 mg total) by mouth daily. 04/26/24   Daneen Damien BROCKS, NP  apixaban  (ELIQUIS ) 5 MG TABS tablet TAKE 1 TABLET BY MOUTH TWICE A DAY 02/09/24   Alvan Ronal BRAVO, MD  atorvastatin  (LIPITOR) 20 MG tablet Take 1 tablet (20 mg total) by mouth daily after supper. 07/25/23   Thedora Garnette HERO, MD  azelastine  (ASTELIN ) 0.1 % nasal spray Place 2 sprays into both nostrils 2 (two) times daily. Use in each nostril as directed 02/14/24   Thedora Garnette HERO, MD  fluticasone  (FLONASE ) 50 MCG/ACT nasal spray Place 2 sprays into both nostrils daily as needed for allergies. 02/14/24   Thedora Garnette HERO, MD  loratadine (CLARITIN) 10 MG tablet Take 10 mg by mouth daily as  needed for allergies.    [provider]  losartan -hydrochlorothiazide (HYZAAR) 50-12.5 MG tablet Take 1 tablet by mouth daily. 10/12/23   Thedora Garnette HERO, MD  metoprolol  tartrate (LOPRESSOR ) 25 MG tablet TAKE 1 TABLET BY MOUTH TWICE A DAY 01/20/24   Alvan Ronal BRAVO, MD  Multiple Vitamin (MULTIVITAMIN) tablet Take 1 tablet by mouth daily.    [provider]  omeprazole  (PRILOSEC ) 20 MG capsule Take 1 capsule (20 mg total) by mouth daily. 07/25/23   Thedora Garnette HERO, MD  Phenylephrine-Acetaminophen  (TYLENOL  SINUS CONGESTION/PAIN PO) Take 1 capsule by mouth as needed (sinus).    [provider]  polyethylene glycol (MIRALAX / GLYCOLAX) 17 g packet Take 17 g by mouth daily.    [provider]  pseudoephedrine (SUDAFED) 120 MG 12 hr tablet Take 120 mg by mouth 2 (two) times daily.    [provider]  Wheat Dextrin (BENEFIBER DRINK MIX PO) Take 1 Dose by mouth daily.    [provider]    Family History Family History  Problem Relation Age of Onset   Heart failure Mother    Heart Problems Mother        pacer   Alcohol  abuse Mother    Arthritis Mother    Drug abuse Mother    Stroke Father    Hearing loss Father    Fragile X syndrome Father    Hypertension Sister    Other Sister        healthty   Other Son        healthy   Other Son        healthy    Social History Social History   Tobacco Use   Smoking status: Former    Current packs/day: 0.00    Average packs/day: 1.5 packs/day for 15.0 years (22.5 ttl pk-yrs)    Types: Cigarettes    Start date: 08/25/1967    Quit date: 08/24/1982    Years since quitting: 41.7   Smokeless tobacco: Never  Vaping Use   Vaping status: Never Used  Substance Use Topics   Alcohol  use: Yes    Alcohol /week: 5.0 standard drinks of alcohol     Types: 5 Standard drinks or equivalent per week    Comment: 1 drink daily   Drug use: No     Allergies   Codeine   Review of Systems Review of  Systems  Skin:  Positive for color change.     Physical Exam Triage Vital Signs ED Triage Vitals  Encounter Vitals Group     BP 05/19/24 1151 (!) 159/84     Girls Systolic BP Percentile --      Girls Diastolic BP  Percentile --      Boys Systolic BP Percentile --      Boys Diastolic BP Percentile --      Pulse Rate 05/19/24 1151 64     Resp 05/19/24 1151 18     Temp 05/19/24 1151 98.5 F (36.9 C)     Temp Source 05/19/24 1151 Oral     SpO2 05/19/24 1151 96 %     Weight 05/19/24 1151 260 lb (117.9 kg)     Height 05/19/24 1151 6' (1.829 m)     Head Circumference --      Peak Flow --      Pain Score 05/19/24 1201 2     Pain Loc --      Pain Education --      Exclude from Growth Chart --    No data found.  Updated Vital Signs BP 123/75 (BP Location: Right Arm)   Pulse 64   Temp 98.5 F (36.9 C) (Oral)   Resp 18   Ht 6' (1.829 m)   Wt 260 lb (117.9 kg)   SpO2 96%   BMI 35.26 kg/m   Visual Acuity Right Eye Distance:   Left Eye Distance:   Bilateral Distance:    Right Eye Near:   Left Eye Near:    Bilateral Near:     Physical Exam Vitals reviewed.  Constitutional:      General: He is awake.     Appearance: Normal appearance. He is well-developed and well-groomed.  HENT:     Head: Normocephalic and atraumatic.   Eyes:     Extraocular Movements: Extraocular movements intact.     Conjunctiva/sclera: Conjunctivae normal.  Pulmonary:     Effort: Pulmonary effort is normal.  Musculoskeletal:     Cervical back: Normal range of motion.  Neurological:     Mental Status: He is alert and oriented to person, place, and time.  Psychiatric:        Attention and Perception: Attention normal.        Mood and Affect: Mood normal.        Speech: Speech normal.        Behavior: Behavior normal. Behavior is cooperative.      UC Treatments / Results  Labs (all labs ordered are listed, but only abnormal results are displayed) Labs Reviewed - No data to  display  EKG   Radiology No results found.  Procedures Procedures (including critical care time)  Medications Ordered in UC Medications - No data to display  Initial Impression / Assessment and Plan / UC Course  I have reviewed the triage vital signs and the nursing notes.  Pertinent labs & imaging results that were available during my care of the patient were reviewed by me and considered in my medical decision making (see chart for details).      Final Clinical Impressions(s) / UC Diagnoses   Final diagnoses:  Scalp irritation   Patient presents today with concerns for reddened lesion on the side of the head that is been present for the last 10 days.  He states that there has been some burning that radiates down the side of his face but this is not constant and seems to come and go randomly.  He denies drainage, bleeding, increased redness or swelling to the affected area.  Physical exam is notable for macular erythematous lesion on the side of the temple without signs of bleeding, excoriation, ulceration, swelling. Cannot rule out healing insect bite, mild shingles, superficial  skin irritation.  Unsure of etiology of the affected area but recommend watchful monitoring for now since it appears to be improving per patient report.  Follow-up as needed for progressing or persistent symptoms  Discharge Instructions   None    ED Prescriptions   None    PDMP not reviewed this encounter.   Robena Ewy, [place] BRAVO, PA-C 05/20/24 1032

## 2024-05-19 NOTE — ED Triage Notes (Signed)
 Pt presents with complaints of insect bite to right side of scalp approximately 7-10 days ago. Pt states his pain was improving however this morning when taking off his CPAP mask, he hit the area on accident. Burning sensation noted ever since. Currently rates overall pain a 2/10. Would like to make sure area is not infected.

## 2024-05-21 ENCOUNTER — Encounter: Payer: Self-pay | Admitting: Family Medicine

## 2024-06-01 ENCOUNTER — Ambulatory Visit: Payer: Medicare HMO

## 2024-06-01 DIAGNOSIS — Z0000 Encounter for general adult medical examination without abnormal findings: Secondary | ICD-10-CM

## 2024-06-01 NOTE — Patient Instructions (Signed)
 Mr. Kerner , Thank you for taking time out of your busy schedule to complete your Annual Wellness Visit with me. I enjoyed our conversation and look forward to speaking with you again next year. I, as well as your care team,  appreciate your ongoing commitment to your health goals. Please review the following plan we discussed and let me know if I can assist you in the future. Your Game plan/ To Do List    Referrals: If you haven't heard from the office you've been referred to, please reach out to them at the phone provided.   Follow up Visits: We will see or speak with you next year for your Next Medicare AWV with our clinical staff Have you seen your provider in the last 6 months (3 months if uncontrolled diabetes)? Yes  Clinician Recommendations:  Aim for 30 minutes of exercise or brisk walking, 6-8 glasses of water , and 5 servings of fruits and vegetables each day.       This is a list of the screenings recommended for you:  Health Maintenance  Topic Date Due   Zoster (Shingles) Vaccine (2 of 2) 05/24/2011   COVID-19 Vaccine (3 - Pfizer risk series) 01/23/2020   Flu Shot  05/25/2024   DTaP/Tdap/Td vaccine (2 - Td or Tdap) 05/27/2025   Medicare Annual Wellness Visit  06/01/2025   Pneumococcal Vaccine for age over 19  Completed   Hepatitis C Screening  Completed   Hepatitis B Vaccine  Aged Out   HPV Vaccine  Aged Out   Meningitis B Vaccine  Aged Out   Colon Cancer Screening  Discontinued    Advanced directives: (In Chart) A copy of your advanced directives are scanned into your chart should your provider ever need it. Advance Care Planning is important because it:  [x]  Makes sure you receive the medical care that is consistent with your values, goals, and preferences  [x]  It provides guidance to your family and loved ones and reduces their decisional burden about whether or not they are making the right decisions based on your wishes.  Follow the link provided in your after visit  summary or read over the paperwork we have mailed to you to help you started getting your Advance Directives in place. If you need assistance in completing these, please reach out to us  so that we can help you!  See attachments for Preventive Care and Fall Prevention Tips.

## 2024-06-01 NOTE — Progress Notes (Signed)
 Subjective:   Cody [name] is a 78 y.o. who presents for a Medicare Wellness preventive visit.  As a reminder, Annual Wellness Visits don't include a physical exam, and some assessments may be limited, especially if this visit is performed virtually. We may recommend an in-person follow-up visit with your provider if needed.  Visit Complete: Virtual I connected with  Cody [name] on 06/01/24 by a audio enabled telemedicine application and verified that I am speaking with the correct person using two identifiers.  Patient Location: Home  Provider Location: Office/Clinic  I discussed the limitations of evaluation and management by telemedicine. The patient expressed understanding and agreed to proceed.  Vital Signs: Because this visit was a virtual/telehealth visit, some criteria may be missing or patient reported. Any vitals not documented were not able to be obtained and vitals that have been documented are patient reported.  VideoError- Librarian, academic were attempted between this provider and patient, however failed, due to patient having technical difficulties OR patient did not have access to video capability.  We continued and completed visit with audio only.   Persons Participating in Visit: Patient.  AWV Questionnaire: Yes: Patient Medicare AWV questionnaire was completed by the patient on 05/25/2024; I have confirmed that all information answered by patient is correct and no changes since this date.  Cardiac Risk Factors include: advanced age (>75men, >20 women);dyslipidemia;hypertension;male gender     Objective:    Today's Vitals   There is no height or weight on file to calculate BMI.     06/01/2024    3:41 PM 10/05/2023    1:41 AM 05/30/2023    3:31 PM 03/03/2017    1:02 PM 03/03/2017   12:57 PM 02/18/2017   10:42 AM 03/25/2016    3:45 PM  Advanced Directives  Does Patient Have a Medical Advance Directive? Yes No Yes  Yes  Yes   Yes   Type of Estate agent of [place];Living will  Healthcare Power of [place];Living will  Living will Healthcare Power of [place];Living will Healthcare Power of [place];Living will   Does patient want to make changes to medical advance directive?     No - Patient declined   No - Patient declined   Copy of Healthcare Power of Attorney in Chart? Yes - validated most recent copy scanned in chart (See row information)  No - copy requested Yes   Yes  No - copy requested      Data saved with a previous flowsheet row definition    Current Medications (verified) Outpatient Encounter Medications as of 06/01/2024  Medication Sig   amiodarone  (PACERONE ) 200 MG tablet Take 1 tablet (200 mg total) by mouth daily.   apixaban  (ELIQUIS ) 5 MG TABS tablet TAKE 1 TABLET BY MOUTH TWICE A DAY   atorvastatin  (LIPITOR) 20 MG tablet Take 1 tablet (20 mg total) by mouth daily after supper.   azelastine  (ASTELIN ) 0.1 % nasal spray Place 2 sprays into both nostrils 2 (two) times daily. Use in each nostril as directed   fluticasone  (FLONASE ) 50 MCG/ACT nasal spray Place 2 sprays into both nostrils daily as needed for allergies.   loratadine (CLARITIN) 10 MG tablet Take 10 mg by mouth daily as needed for allergies.   losartan -hydrochlorothiazide (HYZAAR) 50-12.5 MG tablet Take 1 tablet by mouth daily.   metoprolol  tartrate (LOPRESSOR ) 25 MG tablet TAKE 1 TABLET BY MOUTH TWICE A DAY   Multiple Vitamin (MULTIVITAMIN) tablet Take 1 tablet  by mouth daily.   omeprazole  (PRILOSEC ) 20 MG capsule Take 1 capsule (20 mg total) by mouth daily.   Phenylephrine-Acetaminophen  (TYLENOL  SINUS CONGESTION/PAIN PO) Take 1 capsule by mouth as needed (sinus).   polyethylene glycol (MIRALAX / GLYCOLAX) 17 g packet Take 17 g by mouth daily.   pseudoephedrine (SUDAFED) 120 MG 12 hr tablet Take 120 mg by mouth 2 (two) times daily.   Wheat Dextrin (BENEFIBER DRINK MIX PO) Take 1 Dose by mouth daily.   No  facility-administered encounter medications on file as of 06/01/2024.    Allergies (verified) Codeine   History: Past Medical History:  Diagnosis Date   Allergy 1960   Arthritis    Basal cell carcinoma 09/12/2017   RIGHT SIDEBURN TX CX3 5FU CAUTERY   Cancer (HCC)    Cataract 2016   Constipation    Environmental allergies    GERD (gastroesophageal reflux disease)    Headache    bad sinus headache   History of shingles    History of skin cancer    Hyperlipidemia    Hypertension 05-25-2022   Nocturia    SCC (squamous cell carcinoma) 01/20/2009   LEFT INNER EAR CX3 5FU   SCC (squamous cell carcinoma) 05/05/2010   LEFT PARASPINAL   SCC (squamous cell carcinoma) 09/12/2017   RIGHT NECK TX CX3 5FU   SCC (squamous cell carcinoma) 09/12/2017   RIGHT UPPER BACK TX CX3 5FU CAUTERY   SCC (squamous cell carcinoma) 10/30/2019   LOWER STERNUM TX CX3 5FU CAUTERY   Sleep apnea    Cpap set 10-16   Sleep apnea, obstructive    cpap   Squamous cell carcinoma of skin 01/20/2009   LEFT LOW PARASPINAL CX3 5FU   Past Surgical History:  Procedure Laterality Date   CATARACT EXTRACTION W/ INTRAOCULAR LENS IMPLANT Bilateral 10/25/2014   cataracts removed   EYE SURGERY  2016   Lense Implants   INTERPHALANGEAL JOINT ARTHROPLASTY Left    Great toe   JOINT REPLACEMENT  2016-2017   Both Hips   TOTAL HIP ARTHROPLASTY Left 09/04/2015   Procedure: LEFT TOTAL HIP ARTHROPLASTY ANTERIOR APPROACH;  Surgeon: Redell Shoals, MD;  Location: [facility];  Service: Orthopedics;  Laterality: Left;   TOTAL HIP ARTHROPLASTY Right 03/25/2016   Procedure: RIGHT TOTAL HIP ARTHROPLASTY ANTERIOR APPROACH;  Surgeon: Redell Shoals, MD;  Location: [facility];  Service: Orthopedics;  Laterality: Right;   Family History  Problem Relation Age of Onset   Heart failure Mother    Heart Problems Mother        pacer   Alcohol  abuse Mother    Arthritis Mother    Drug abuse Mother    Stroke Father    Hearing loss Father     Fragile X syndrome Father    Hypertension Sister    Other Sister        healthty   Other Son        healthy   Other Son        healthy   Social History   Socioeconomic History   Marital status: Married    Spouse name: Not on file   Number of children: 2   Years of education: Not on file   Highest education level: 12th grade  Occupational History   Occupation: Retired  Tobacco Use   Smoking status: Former    Current packs/day: 0.00    Average packs/day: 1.5 packs/day for 15.0 years (22.5 ttl pk-yrs)    Types: Cigarettes  Start date: 08/25/1967    Quit date: 08/24/1982    Years since quitting: 41.8   Smokeless tobacco: Never  Vaping Use   Vaping status: Never Used  Substance and Sexual Activity   Alcohol  use: Yes    Alcohol /week: 5.0 standard drinks of alcohol     Types: 5 Standard drinks or equivalent per week    Comment: 1 drink daily   Drug use: No   Sexual activity: Yes  Other Topics Concern   Not on file  Social History Narrative   Not on file   Social Drivers of Health   Financial Resource Strain: Low Risk  (06/01/2024)   Overall Financial Resource Strain (CARDIA)    Difficulty of Paying Living Expenses: Not hard at all  Food Insecurity: No Food Insecurity (06/01/2024)   Hunger Vital Sign    Worried About Running Out of Food in the Last Year: Never true    Ran Out of Food in the Last Year: Never true  Transportation Needs: No Transportation Needs (06/01/2024)   PRAPARE - Administrator, Civil Service (Medical): No    Lack of Transportation (Non-Medical): No  Physical Activity: Insufficiently Active (06/01/2024)   Exercise Vital Sign    Days of Exercise per Week: 7 days    Minutes of Exercise per Session: 10 min  Stress: No Stress Concern Present (06/01/2024)   [employer] of Occupational Health - Occupational Stress Questionnaire    Feeling of Stress: Not at all  Social Connections: Moderately Isolated (06/01/2024)   Social Connection and  Isolation Panel    Frequency of Communication with Friends and Family: Three times a week    Frequency of Social Gatherings with Friends and Family: Once a week    Attends Religious Services: Never    Database administrator or Organizations: No    Attends Engineer, structural: Never    Marital Status: Married    Tobacco Counseling Counseling given: Not Answered    Clinical Intake:  Pre-visit preparation completed: Yes  Pain : No/denies pain     Nutritional Risks: None Diabetes: No  Lab Results  Component Value Date   HGBA1C 5.5 07/05/2023     How often do you need to have someone help you when you read instructions, pamphlets, or other written materials from your doctor or pharmacy?: 1 - Never  Interpreter Needed?: No  Information entered by :: NAllen LPN   Activities of Daily Living     05/25/2024    1:58 PM  In your present state of health, do you have any difficulty performing the following activities:  Hearing? 0  Vision? 0  Difficulty concentrating or making decisions? 0  Walking or climbing stairs? 0  Dressing or bathing? 0  Doing errands, shopping? 0  Preparing Food and eating ? N  Using the Toilet? N  In the past six months, have you accidently leaked urine? N  Do you have problems with loss of bowel control? N  Managing your Medications? N  Managing your Finances? N  Housekeeping or managing your Housekeeping? N    Patient Care Team: Thedora Garnette HERO, MD as PCP - General (Family Medicine) Alvan Ronal BRAVO, MD (Inactive) as PCP - Cardiology (Cardiology) Shona Rush, MD (Dermatology) Cleatus Collar, MD as Consulting Physician (Ophthalmology) Fidel Rogue, MD as Consulting Physician (Orthopedic Surgery) Kit Rush, MD as Consulting Physician (Orthopedic Surgery) Burnette Fallow, MD as Consulting Physician (Gastroenterology) Carlie Clark, MD as Consulting Physician (Otolaryngology) Neysa Reggy BIRCH, MD  as Consulting Physician (Pulmonary  Disease)  I have updated your Care Teams any recent Medical Services you may have received from other providers in the past year.     Assessment:   This is a routine wellness examination for Cody [name].  Hearing/Vision screen Hearing Screening - Comments:: Denies hearing issues Vision Screening - Comments:: Regular eye exams, [facility]   Goals Addressed             This Visit's Progress    Patient Stated       06/01/2024, get off some medications       Depression Screen     06/01/2024    3:42 PM 02/14/2024    9:13 AM 07/05/2023    8:30 AM 05/30/2023    3:32 PM 04/19/2023   10:10 AM  PHQ 2/9 Scores  PHQ - 2 Score 0 0 0 0 0  PHQ- 9 Score 2 0 0 2 3    Fall Risk     06/01/2024    3:42 PM 05/25/2024    1:58 PM 02/14/2024    9:13 AM 07/05/2023    8:30 AM 05/25/2023    8:32 AM  Fall Risk   Falls in the past year? 0 0 0 0 0  Number falls in past yr: 0  0 0 0  Injury with Fall? 0  0 0 0  Risk for fall due to : Medication side effect  No Fall Risks No Fall Risks Medication side effect  Follow up Falls evaluation completed;Falls prevention discussed  Falls evaluation completed Falls evaluation completed Falls prevention discussed;Falls evaluation completed    MEDICARE RISK AT HOME:  Medicare Risk at Home Any stairs in or around the home?: (Patient-Rptd) No Home free of loose throw rugs in walkways, pet beds, electrical cords, etc?: (Patient-Rptd) Yes Adequate lighting in your home to reduce risk of falls?: (Patient-Rptd) Yes Life alert?: (Patient-Rptd) No Use of a cane, walker or w/c?: (Patient-Rptd) No Grab bars in the bathroom?: (Patient-Rptd) Yes Shower chair or bench in shower?: (Patient-Rptd) Yes Elevated toilet seat or a handicapped toilet?: (Patient-Rptd) Yes  TIMED UP AND GO:  Was the test performed?  No  Cognitive Function: 6CIT completed        06/01/2024    3:43 PM 05/30/2023    3:33 PM  6CIT Screen  What Year? 0 points 0 points  What month? 0 points 0  points  What time? 0 points 0 points  Count back from 20 0 points 0 points  Months in reverse 0 points 0 points  Repeat phrase 0 points 0 points  Total Score 0 points 0 points    Immunizations Immunization History  Administered Date(s) Administered   PFIZER(Purple Top)SARS-COV-2 Vaccination 11/30/2019, 12/26/2019   Pneumococcal Conjugate-13 05/22/2014   Pneumococcal Polysaccharide-23 04/19/2013   Tdap 05/28/2015   Zoster Recombinant(Shingrix) 03/29/2011    Screening Tests Health Maintenance  Topic Date Due   Zoster Vaccines- Shingrix (2 of 2) 05/24/2011   COVID-19 Vaccine (3 - Pfizer risk series) 01/23/2020   INFLUENZA VACCINE  05/25/2024   DTaP/Tdap/Td (2 - Td or Tdap) 05/27/2025   Medicare Annual Wellness (AWV)  06/01/2025   Pneumococcal Vaccine: 50+ Years  Completed   Hepatitis C Screening  Completed   Hepatitis B Vaccines  Aged Out   HPV VACCINES  Aged Out   Meningococcal B Vaccine  Aged Out   Colonoscopy  Discontinued    Health Maintenance  Health Maintenance Due  Topic Date Due   Zoster  Vaccines- Shingrix (2 of 2) 05/24/2011   COVID-19 Vaccine (3 - Pfizer risk series) 01/23/2020   INFLUENZA VACCINE  05/25/2024   Health Maintenance Items Addressed: Due for flu, covid and shingles vaccines.  Additional Screening:  Vision Screening: Recommended annual ophthalmology exams for early detection of glaucoma and other disorders of the eye. Would you like a referral to an eye doctor? No    Dental Screening: Recommended annual dental exams for proper oral hygiene  Community Resource Referral / Chronic Care Management: CRR required this visit?  No   CCM required this visit?  No   Plan:    I have personally reviewed and noted the following in the patient's chart:   Medical and social history Use of alcohol , tobacco or illicit drugs  Current medications and supplements including opioid prescriptions. Patient is not currently taking opioid  prescriptions. Functional ability and status Nutritional status Physical activity Advanced directives List of other physicians Hospitalizations, surgeries, and ER visits in previous 12 months Vitals Screenings to include cognitive, depression, and falls Referrals and appointments  In addition, I have reviewed and discussed with patient certain preventive protocols, quality metrics, and best practice recommendations. A written personalized care plan for preventive services as well as general preventive health recommendations were provided to patient.   Ardella FORBES Dawn, LPN   10/25/7972   After Visit Summary: (MyChart) Due to this being a telephonic visit, the after visit summary with patients personalized plan was offered to patient via MyChart   Notes: Nothing significant to report at this time.

## 2024-06-05 ENCOUNTER — Encounter: Payer: Self-pay | Admitting: Nurse Practitioner

## 2024-06-05 ENCOUNTER — Ambulatory Visit: Attending: Nurse Practitioner | Admitting: Nurse Practitioner

## 2024-06-05 VITALS — BP 132/70 | HR 64 | Ht 72.0 in | Wt 272.0 lb

## 2024-06-05 DIAGNOSIS — I480 Paroxysmal atrial fibrillation: Secondary | ICD-10-CM | POA: Diagnosis not present

## 2024-06-05 DIAGNOSIS — G4733 Obstructive sleep apnea (adult) (pediatric): Secondary | ICD-10-CM

## 2024-06-05 DIAGNOSIS — I10 Essential (primary) hypertension: Secondary | ICD-10-CM

## 2024-06-05 DIAGNOSIS — E782 Mixed hyperlipidemia: Secondary | ICD-10-CM

## 2024-06-05 NOTE — Progress Notes (Signed)
 Office Visit    Patient Name: Cody [name] Date of Encounter: 06/05/2024  Primary Care Provider:  Thedora Garnette HERO, MD Primary Cardiologist:  Darryle ONEIDA Decent, MD  Chief Complaint    78 year old male with a history of paroxysmal atrial fibrillation, hypertension, hyperlipidemia, OSA and GERD who presents for follow-up related to atrial fibrillation.   Past Medical History    Past Medical History:  Diagnosis Date   Allergy 1960   Arthritis    Basal cell carcinoma 09/12/2017   RIGHT SIDEBURN TX CX3 5FU CAUTERY   Cancer (HCC)    Cataract 2016   Constipation    Environmental allergies    GERD (gastroesophageal reflux disease)    Headache    bad sinus headache   History of shingles    History of skin cancer    Hyperlipidemia    Hypertension 05-25-2022   Nocturia    SCC (squamous cell carcinoma) 01/20/2009   LEFT INNER EAR CX3 5FU   SCC (squamous cell carcinoma) 05/05/2010   LEFT PARASPINAL   SCC (squamous cell carcinoma) 09/12/2017   RIGHT NECK TX CX3 5FU   SCC (squamous cell carcinoma) 09/12/2017   RIGHT UPPER BACK TX CX3 5FU CAUTERY   SCC (squamous cell carcinoma) 10/30/2019   LOWER STERNUM TX CX3 5FU CAUTERY   Sleep apnea    Cpap set 10-16   Sleep apnea, obstructive    cpap   Squamous cell carcinoma of skin 01/20/2009   LEFT LOW PARASPINAL CX3 5FU   Past Surgical History:  Procedure Laterality Date   CATARACT EXTRACTION W/ INTRAOCULAR LENS IMPLANT Bilateral 10/25/2014   cataracts removed   EYE SURGERY  2016   Lense Implants   INTERPHALANGEAL JOINT ARTHROPLASTY Left    Great toe   JOINT REPLACEMENT  2016-2017   Both Hips   TOTAL HIP ARTHROPLASTY Left 09/04/2015   Procedure: LEFT TOTAL HIP ARTHROPLASTY ANTERIOR APPROACH;  Surgeon: Redell Shoals, MD;  Location: [facility];  Service: Orthopedics;  Laterality: Left;   TOTAL HIP ARTHROPLASTY Right 03/25/2016   Procedure: RIGHT TOTAL HIP ARTHROPLASTY ANTERIOR APPROACH;  Surgeon: Redell Shoals, MD;  Location:  [facility];  Service: Orthopedics;  Laterality: Right;    Allergies  Allergies  Allergen Reactions   Codeine Nausea And Vomiting and Other (See Comments)    Can't take in large doses     Labs/Other Studies Reviewed    The following studies were reviewed today:  Cardiac Studies & Procedures   ______________________________________________________________________________________________     ECHOCARDIOGRAM  ECHOCARDIOGRAM COMPLETE 10/07/2023  Narrative ECHOCARDIOGRAM REPORT    Patient Name:   Cody [name] Date of Exam: 10/07/2023 Medical Rec #:  [id number]           Height:       72.0 in Accession #:    [id number]          Weight:       256.2 lb Date of Birth:  05-19-1946           BSA:          2.367 m Patient Age:    77 years            BP:           134/70 mmHg Patient Gender: M                   HR:           79 bpm. Exam Location:  Outpatient  Procedure: 2D Echo, Cardiac Doppler and Color Doppler  Indications:    A-fib  History:        Patient has no prior history of Echocardiogram examinations. CKD, Arrythmias:Atrial Fibrillation; Risk Factors:Hypertension, Sleep Apnea and Dyslipidemia.  Sonographer:    Tillman Nora RVT RCS Referring Phys: 27 RHONDA G BARRETT  IMPRESSIONS   1. Left ventricular ejection fraction, by estimation, is 60 to 65%. The left ventricle has normal function. The left ventricle has no regional wall motion abnormalities. Left ventricular diastolic parameters were normal. 2. Right ventricular systolic function is normal. The right ventricular size is normal. 3. Left atrial size was mildly dilated. 4. The mitral valve is abnormal. Trivial mitral valve regurgitation. No evidence of mitral stenosis. 5. The aortic valve is tricuspid. There is mild calcification of the aortic valve. There is mild thickening of the aortic valve. Aortic valve regurgitation is not visualized. Aortic valve sclerosis is present, with no evidence of aortic valve  stenosis. 6. The inferior vena cava is normal in size with greater than 50% respiratory variability, suggesting right atrial pressure of 3 mmHg.  FINDINGS Left Ventricle: Left ventricular ejection fraction, by estimation, is 60 to 65%. The left ventricle has normal function. The left ventricle has no regional wall motion abnormalities. The left ventricular internal cavity size was normal in size. There is no left ventricular hypertrophy. Left ventricular diastolic parameters were normal.  Right Ventricle: The right ventricular size is normal. No increase in right ventricular wall thickness. Right ventricular systolic function is normal.  Left Atrium: Left atrial size was mildly dilated.  Right Atrium: Right atrial size was normal in size.  Pericardium: There is no evidence of pericardial effusion.  Mitral Valve: The mitral valve is abnormal. There is mild thickening of the mitral valve leaflet(s). There is mild calcification of the mitral valve leaflet(s). Trivial mitral valve regurgitation. No evidence of mitral valve stenosis.  Tricuspid Valve: The tricuspid valve is normal in structure. Tricuspid valve regurgitation is trivial. No evidence of tricuspid stenosis.  Aortic Valve: The aortic valve is tricuspid. There is mild calcification of the aortic valve. There is mild thickening of the aortic valve. Aortic valve regurgitation is not visualized. Aortic valve sclerosis is present, with no evidence of aortic valve stenosis. Aortic valve mean gradient measures 2.0 mmHg. Aortic valve peak gradient measures 4.2 mmHg. Aortic valve area, by VTI measures 2.61 cm.  Pulmonic Valve: The pulmonic valve was normal in structure. Pulmonic valve regurgitation is not visualized. No evidence of pulmonic stenosis.  Aorta: The aortic root is normal in size and structure.  Venous: The inferior vena cava is normal in size with greater than 50% respiratory variability, suggesting right atrial pressure of 3  mmHg.  IAS/Shunts: No atrial level shunt detected by color flow Doppler.   LEFT VENTRICLE PLAX 2D LVIDd:         4.50 cm   Diastology LVIDs:         3.10 cm   LV e' medial:    8.81 cm/s LV PW:         1.00 cm   LV E/e' medial:  9.5 LV IVS:        1.10 cm   LV e' lateral:   8.81 cm/s LVOT diam:     1.80 cm   LV E/e' lateral: 9.5 LV SV:         59 LV SV Index:   25 LVOT Area:     2.54 cm   RIGHT VENTRICLE  IVC RV Basal diam:  3.80 cm     IVC diam: 1.30 cm RV S prime:     10.80 cm/s TAPSE (M-mode): 2.3 cm  LEFT ATRIUM             Index        RIGHT ATRIUM           Index LA diam:        4.00 cm 1.69 cm/m   RA Area:     17.10 cm LA Vol (A2C):   64.0 ml 27.04 ml/m  RA Volume:   49.50 ml  20.91 ml/m LA Vol (A4C):   61.4 ml 25.94 ml/m LA Biplane Vol: 63.7 ml 26.91 ml/m AORTIC VALVE                    PULMONIC VALVE AV Area (Vmax):    2.78 cm     PV Vmax:       0.90 m/s AV Area (Vmean):   2.59 cm     PV Peak grad:  3.2 mmHg AV Area (VTI):     2.61 cm AV Vmax:           102.40 cm/s AV Vmean:          71.600 cm/s AV VTI:            0.227 m AV Peak Grad:      4.2 mmHg AV Mean Grad:      2.0 mmHg LVOT Vmax:         112.00 cm/s LVOT Vmean:        72.950 cm/s LVOT VTI:          0.233 m LVOT/AV VTI ratio: 1.03  AORTA Ao Root diam: 3.00 cm Ao Asc diam:  3.10 cm  MITRAL VALVE MV Area (PHT): 3.61 cm    SHUNTS MV Decel Time: 210 msec    Systemic VTI:  0.23 m MV E velocity: 83.90 cm/s  Systemic Diam: 1.80 cm MV A velocity: 93.90 cm/s MV E/A ratio:  0.89  Maude Emmer MD Electronically signed by Maude Emmer MD Signature Date/Time: 10/07/2023/10:55:53 AM    Final          ______________________________________________________________________________________________     Recent Labs: 10/06/2023: Magnesium  1.9; TSH 2.774 10/12/2023: ALT 39; BUN 12; Creatinine, Ser 1.20; Hemoglobin 14.7; Platelets 371.0; Potassium 3.7; Sodium 138  Recent Lipid  Panel    Component Value Date/Time   CHOL 132 07/05/2023 0849   TRIG 141.0 07/05/2023 0849   HDL 31.40 (L) 07/05/2023 0849   CHOLHDL 4 07/05/2023 0849   VLDL 28.2 07/05/2023 0849   LDLCALC 73 07/05/2023 0849    History of Present Illness    78 year old male with the above past medical history including paroxysmal atrial fibrillation, hypertension, hyperlipidemia, OSA and GERD who presents hospital follow-up related to atrial fibrillation.   He was previously evaluated by cardiology in 2016 in the setting of preoperative cardiovascular assessment through improving history of CAD. He was hospitalized in December 2024 in the setting of sepsis, influenza A, atrial fibrillation with RVR.  Cardiology was consulted.  He was started on amiodarone  and converted to sinus rhythm.  Additionally, he was started on metoprolol  and Eliquis .  Echocardiogram showed EF 60 to 65%, normal LV function, no RWMA, normal RV systolic function, no significant valvular abnormalities.  He had an incidental finding of hypodensity on right dome of liver on CT, outpatient MRI was recommended.  He was last seen in the  office on 10/27/2023 and was stable from a cardiac standpoint.  He was seen virtually on 12/26/2023.  It was noted that should he have no recurrence of atrial fibrillation, amiodarone  could be discontinued.  Ongoing anticoagulation therapy was recommended.  He did note some mild chest pressure, it was noted that should he have progressive symptoms, Lexiscan Myoview could be considered.   He presents today for follow-up accompanied by his wife. Since his last visit He has done well from a cardiac standpoint.  He denies any awareness of recurrent atrial fibrillation. He does have a Kardia mobile device for home monitoring.  He denies any chest pain, palpitations, dizziness, dyspnea, edema, PND, orthopnea, weight gain, denies bleeding.  Overall, he reports feeling well.    Home Medications    Current Outpatient  Medications  Medication Sig Dispense Refill   apixaban  (ELIQUIS ) 5 MG TABS tablet TAKE 1 TABLET BY MOUTH TWICE A DAY 180 tablet 1   atorvastatin  (LIPITOR) 20 MG tablet Take 1 tablet (20 mg total) by mouth daily after supper. 90 tablet 3   azelastine  (ASTELIN ) 0.1 % nasal spray Place 2 sprays into both nostrils 2 (two) times daily. Use in each nostril as directed 30 mL 11   fluticasone  (FLONASE ) 50 MCG/ACT nasal spray Place 2 sprays into both nostrils daily as needed for allergies. 11.1 mL 11   loratadine (CLARITIN) 10 MG tablet Take 10 mg by mouth daily as needed for allergies.     losartan -hydrochlorothiazide (HYZAAR) 50-12.5 MG tablet Take 1 tablet by mouth daily. 90 tablet 3   metoprolol  tartrate (LOPRESSOR ) 25 MG tablet TAKE 1 TABLET BY MOUTH TWICE A DAY 180 tablet 3   Multiple Vitamin (MULTIVITAMIN) tablet Take 1 tablet by mouth daily.     omeprazole  (PRILOSEC ) 20 MG capsule Take 1 capsule (20 mg total) by mouth daily. 90 capsule 3   Phenylephrine-Acetaminophen  (TYLENOL  SINUS CONGESTION/PAIN PO) Take 1 capsule by mouth as needed (sinus).     polyethylene glycol (MIRALAX / GLYCOLAX) 17 g packet Take 17 g by mouth daily.     pseudoephedrine (SUDAFED) 120 MG 12 hr tablet Take 120 mg by mouth 2 (two) times daily.     Wheat Dextrin (BENEFIBER DRINK MIX PO) Take 1 Dose by mouth daily.     No current facility-administered medications for this visit.     Review of Systems   He denies chest pain, palpitations, dyspnea, pnd, orthopnea, n, v, dizziness, syncope, edema, weight gain, or early satiety. All other systems reviewed and are otherwise negative except as noted above.   Physical Exam    VS:  BP 132/70   Pulse 64   Ht 6' (1.829 m)   Wt 272 lb (123.4 kg)   SpO2 96%   BMI 36.89 kg/m  GEN: Well nourished, well developed, in no acute distress. HEENT: normal. Neck: Supple, no JVD, carotid bruits, or masses. Cardiac: RRR, no murmurs, rubs, or gallops. No clubbing, cyanosis, edema.   Radials/DP/PT 2+ and equal bilaterally.  Respiratory:  Respirations regular and unlabored, clear to auscultation bilaterally. GI: Soft, nontender, nondistended, BS + x 4. MS: no deformity or atrophy. Skin: warm and dry, no rash. Neuro:  Strength and sensation are intact. Psych: Normal affect.  Accessory Clinical Findings    ECG personally reviewed by me today -    - no EKG in office today.   Lab Results  Component Value Date   WBC 10.3 10/12/2023   HGB 14.7 10/12/2023   HCT 42.7 10/12/2023  MCV 85.2 10/12/2023   PLT 371.0 10/12/2023   Lab Results  Component Value Date   CREATININE 1.20 10/12/2023   BUN 12 10/12/2023   NA 138 10/12/2023   K 3.7 10/12/2023   CL 105 10/12/2023   CO2 27 10/12/2023   Lab Results  Component Value Date   ALT 39 10/12/2023   AST 19 10/12/2023   ALKPHOS 66 10/12/2023   BILITOT 0.9 10/12/2023   Lab Results  Component Value Date   CHOL 132 07/05/2023   HDL 31.40 (L) 07/05/2023   LDLCALC 73 07/05/2023   TRIG 141.0 07/05/2023   CHOLHDL 4 07/05/2023    Lab Results  Component Value Date   HGBA1C 5.5 07/05/2023    Assessment & Plan   1. Paroxysmal atrial fibrillation: Diagnosed in the setting of sepsis due to influenza A.  Echo showed EF 60 to 65%, normal LV function, no RWMA, normal RV systolic function, no significant valvular abnormalities. Maintaining NSR on exam.  He denies any palpitations.  Denies bleeding.  He reports he will have labs drawn with his PCP at his physical in September 2025.  Given no evidence of recurrent atrial fibrillation, per Dr. Ranae previous recommendations, will discontinue amiodarone . Discussed monitoring with KardiaMobile device. Reviewed ED precautions.  Continue metoprolol , Eliquis  (CHA2DS2-VASc Score = 3).    2. Hypertension: BP well controlled.  Continue current antihypertensive regimen.    3. Hyperlipidemia: LDL was 73 in 06/2023.  Pending repeat labs per PCP.  Continue Lipitor.   4. OSA: Adherent to  CPAP.    5. Disposition: Follow-up in 6 months, he wishes to establish with Dr. Barbaraann.     Damien JAYSON Braver, NP 06/07/2024, 6:54 PM

## 2024-06-05 NOTE — Patient Instructions (Signed)
 Medication Instructions:  Stop Amiodarone  as directed  *If you need a refill on your cardiac medications before your next appointment, please call your pharmacy*  Lab Work: NONE ordered at this time of appointment   Testing/Procedures: NONE ordered at this time of appointment   Follow-Up: At [facility], you and your health needs are our priority.  As part of our continuing mission to provide you with exceptional heart care, our providers are all part of one team.  This team includes your primary Cardiologist (physician) and Advanced Practice Providers or APPs (Physician Assistants and Nurse Practitioners) who all work together to provide you with the care you need, when you need it.  Your next appointment:   6 month(s)  Provider:   Dr. Barbaraann or Damien Braver NP          We recommend signing up for the patient portal called MyChart.  Sign up information is provided on this After Visit Summary.  MyChart is used to connect with patients for Virtual Visits (Telemedicine).  Patients are able to view lab/test results, encounter notes, upcoming appointments, etc.  Non-urgent messages can be sent to your provider as well.   To learn more about what you can do with MyChart, go to [email or web address].

## 2024-06-07 ENCOUNTER — Encounter: Payer: Self-pay | Admitting: Nurse Practitioner

## 2024-07-04 DIAGNOSIS — G4733 Obstructive sleep apnea (adult) (pediatric): Secondary | ICD-10-CM | POA: Diagnosis not present

## 2024-07-05 ENCOUNTER — Ambulatory Visit (INDEPENDENT_AMBULATORY_CARE_PROVIDER_SITE_OTHER): Admitting: Family Medicine

## 2024-07-05 ENCOUNTER — Ambulatory Visit: Payer: Self-pay | Admitting: Family Medicine

## 2024-07-05 ENCOUNTER — Encounter: Payer: Self-pay | Admitting: Family Medicine

## 2024-07-05 VITALS — BP 138/70 | HR 58 | Temp 97.0°F | Ht 72.0 in | Wt 273.4 lb

## 2024-07-05 DIAGNOSIS — I4891 Unspecified atrial fibrillation: Secondary | ICD-10-CM | POA: Diagnosis not present

## 2024-07-05 DIAGNOSIS — I10 Essential (primary) hypertension: Secondary | ICD-10-CM | POA: Diagnosis not present

## 2024-07-05 DIAGNOSIS — R7303 Prediabetes: Secondary | ICD-10-CM | POA: Diagnosis not present

## 2024-07-05 DIAGNOSIS — Z0000 Encounter for general adult medical examination without abnormal findings: Secondary | ICD-10-CM | POA: Diagnosis not present

## 2024-07-05 DIAGNOSIS — N1831 Chronic kidney disease, stage 3a: Secondary | ICD-10-CM | POA: Diagnosis not present

## 2024-07-05 DIAGNOSIS — Z23 Encounter for immunization: Secondary | ICD-10-CM | POA: Diagnosis not present

## 2024-07-05 DIAGNOSIS — E782 Mixed hyperlipidemia: Secondary | ICD-10-CM

## 2024-07-05 DIAGNOSIS — R399 Unspecified symptoms and signs involving the genitourinary system: Secondary | ICD-10-CM | POA: Diagnosis not present

## 2024-07-05 LAB — COMPREHENSIVE METABOLIC PANEL WITH GFR
ALT: 29 U/L (ref 0–53)
AST: 24 U/L (ref 0–37)
Albumin: 4.4 g/dL (ref 3.5–5.2)
Alkaline Phosphatase: 56 U/L (ref 39–117)
BUN: 12 mg/dL (ref 6–23)
CO2: 25 meq/L (ref 19–32)
Calcium: 9.6 mg/dL (ref 8.4–10.5)
Chloride: 104 meq/L (ref 96–112)
Creatinine, Ser: 1.31 mg/dL (ref 0.40–1.50)
GFR: 52.94 mL/min — ABNORMAL LOW (ref >60.00)
Glucose, Bld: 110 mg/dL — ABNORMAL HIGH (ref 70–99)
Potassium: 4 meq/L (ref 3.5–5.1)
Sodium: 138 meq/L (ref 135–145)
Total Bilirubin: 0.8 mg/dL (ref 0.2–1.2)
Total Protein: 6.5 g/dL (ref 6.0–8.3)

## 2024-07-05 LAB — CBC
HCT: 41.2 % (ref 39.0–52.0)
Hemoglobin: 14.4 g/dL (ref 13.0–17.0)
MCHC: 35 g/dL (ref 30.0–36.0)
MCV: 86.5 fl (ref 78.0–100.0)
Platelets: 228 K/uL (ref 150.0–400.0)
RBC: 4.76 Mil/uL (ref 4.22–5.81)
RDW: 13.5 % (ref 11.5–15.5)
WBC: 5.5 K/uL (ref 4.0–10.5)

## 2024-07-05 LAB — LIPID PANEL
Cholesterol: 131 mg/dL (ref 0–200)
HDL: 36.8 mg/dL — ABNORMAL LOW (ref >39.00)
LDL Cholesterol: 75 mg/dL (ref 0–99)
NonHDL: 93.94
Total CHOL/HDL Ratio: 4
Triglycerides: 95 mg/dL (ref 0.0–149.0)
VLDL: 19 mg/dL (ref 0.0–40.0)

## 2024-07-05 LAB — PSA, MEDICARE: PSA: 1.65 ng/mL (ref 0.10–4.00)

## 2024-07-05 LAB — TSH: TSH: 2.03 u[IU]/mL (ref 0.35–5.50)

## 2024-07-05 LAB — PHOSPHORUS: Phosphorus: 3.7 mg/dL (ref 2.3–4.6)

## 2024-07-05 LAB — HEMOGLOBIN A1C: Hgb A1c MFr Bld: 5.6 % (ref 4.6–6.5)

## 2024-07-05 MED ORDER — TAMSULOSIN HCL 0.4 MG PO CAPS: 0.4000 mg | ORAL_CAPSULE | Freq: Every day | ORAL | 3 refills | Status: DC

## 2024-07-05 NOTE — Assessment & Plan Note (Signed)
 Continue focus on blood pressure, adequate hydration, and avoidance of nephrotoxic medications. Labs today to monitor kidney function and assess for complications.

## 2024-07-05 NOTE — Assessment & Plan Note (Signed)
 Blood pressure adequately controlled. Continue losartan -HCTZ (Hyzaar) 50-12.5 mg daily.

## 2024-07-05 NOTE — Assessment & Plan Note (Signed)
 Symptoms and exam suggest BPH. I will check a PSA level to assess for possible cancer. I recommend we start tamsulosin  0.4 mg at bedtime.

## 2024-07-05 NOTE — Assessment & Plan Note (Signed)
 Stable. Metoprolol  tartrate 25 mg bid. Continue apixaban  5 mg bid for stroke prevention. Cardiology requests a TSH and CMP before he is seen next  Sept.

## 2024-07-05 NOTE — Assessment & Plan Note (Signed)
 Overall health is fair. Recommend regular exercise as much as is tolerated. Discussed recommended screenings and immunizations. Needs zoster #2. Recommend he discuss with his pharmacist.

## 2024-07-05 NOTE — Progress Notes (Signed)
 [facility] [address] [place] [address] Dept: [phone] Dept Fax: [phone]  Annual Physical Visit  Subjective:    Patient ID: Cody [name], male    DOB: 1945-12-11, 78 y.o..   MRN: [id number]  Chief Complaint  Patient presents with   Annual Exam    CPE/labs.   Fasting today. No concerns.  Wants flu shot.    History of Present Illness:  Patient is in today for an annual physical/preventative visit.  Mr. Sisler has a history of hypertension. He is managed on losartan -HCTZ (Hyzaar) 50-12.5 mg daily. He also has had some moderate chronic kidney disease, though his last eGFR was 59.   Mr. Frisk developed new-onset atrial fibrillation during a hospitalization for sepsis and influenza in early December 2024. cardiology stopped his amiodarone   as he has been maintaining a sinus rhythm. He remains on metoprolol  tartrate 25 mg bid for rate control, and apixaban  5 mg bid for stroke prevention. He denies any current episodes of tachycardia or palpitations.   Mr. Vath has a history of hyperlipidemia. He is managed on atorvastatin  20 mg daily.  Review of Systems  Constitutional:  Positive for malaise/fatigue. Negative for chills, diaphoresis, fever and weight loss.       Notes he does not have as much energy as he felt he had prior to having sepsis last winter. He is exercising some, though he does not push the intensity of this.  HENT:  Positive for hearing loss. Negative for congestion, ear pain, sinus pain, sore throat and tinnitus.        Use bilateral hearing aides, which are working well.  Eyes:  Negative for blurred vision, pain, discharge and redness.  Respiratory:  Negative for cough, shortness of breath and wheezing.   Cardiovascular:  Negative for chest pain and palpitations.  Gastrointestinal:  Negative for abdominal pain, constipation, diarrhea, heartburn, nausea and vomiting.  Genitourinary:        Admits to  nocturia 3-4 times a night, urinary hesitancy and weak stream.  Musculoskeletal:  Positive for myalgias. Negative for back pain and joint pain.       Has a chronic left thigh muscle weakness and stiffness. This is a residual issue since his left hip joint replacement.  Skin:  Negative for itching and rash.  Psychiatric/Behavioral:  Negative for depression. The patient is not nervous/anxious.    Past Medical History: Patient Active Problem List   Diagnosis Date Noted   Lower urinary tract symptoms (LUTS) 07/05/2024   Annual physical exam 07/05/2024   Simple hepatic cyst 10/12/2023   Atrial fibrillation with rapid ventricular response (HCC) 10/06/2023   Actinic keratoses 07/05/2023   Diastasis recti 07/05/2023   Neurosensory deafness 07/05/2023   Osteoarthritis of multiple joints 07/05/2023   Essential hypertension 04/19/2023   GERD (gastroesophageal reflux disease) 04/19/2023   Class 2 obesity due to excess calories with body mass index (BMI) of 35.0 to 35.9 in adult 04/19/2023   CKD stage 3a, GFR 45-59 ml/min (HCC) 04/19/2023   Prediabetes 04/19/2023   OSA (obstructive sleep apnea) 06/29/2017   Hyperlipidemia 06/20/2015   Seasonal allergic rhinitis 01/21/2009   Past Surgical History:  Procedure Laterality Date   CATARACT EXTRACTION W/ INTRAOCULAR LENS IMPLANT Bilateral 10/25/2014   cataracts removed   EYE SURGERY  2016   Lense Implants   INTERPHALANGEAL JOINT ARTHROPLASTY Left    Great toe   JOINT REPLACEMENT  2016-2017   Both Hips   TOTAL HIP  ARTHROPLASTY Left 09/04/2015   Procedure: LEFT TOTAL HIP ARTHROPLASTY ANTERIOR APPROACH;  Surgeon: Redell Shoals, MD;  Location: [facility];  Service: Orthopedics;  Laterality: Left;   TOTAL HIP ARTHROPLASTY Right 03/25/2016   Procedure: RIGHT TOTAL HIP ARTHROPLASTY ANTERIOR APPROACH;  Surgeon: Redell Shoals, MD;  Location: [facility];  Service: Orthopedics;  Laterality: Right;   Family History  Problem Relation Age of Onset   Heart failure  Mother    Heart Problems Mother        pacer   Alcohol  abuse Mother    Arthritis Mother    Drug abuse Mother    Stroke Father    Hearing loss Father    Fragile X syndrome Father    Hypertension Sister    Other Sister        healthty   Other Son        healthy   Other Son        healthy   Outpatient Medications Prior to Visit  Medication Sig Dispense Refill   apixaban  (ELIQUIS ) 5 MG TABS tablet TAKE 1 TABLET BY MOUTH TWICE A DAY 180 tablet 1   atorvastatin  (LIPITOR) 20 MG tablet Take 1 tablet (20 mg total) by mouth daily after supper. 90 tablet 3   azelastine  (ASTELIN ) 0.1 % nasal spray Place 2 sprays into both nostrils 2 (two) times daily. Use in each nostril as directed 30 mL 11   fluticasone  (FLONASE ) 50 MCG/ACT nasal spray Place 2 sprays into both nostrils daily as needed for allergies. 11.1 mL 11   loratadine (CLARITIN) 10 MG tablet Take 10 mg by mouth daily as needed for allergies.     losartan -hydrochlorothiazide (HYZAAR) 50-12.5 MG tablet Take 1 tablet by mouth daily. 90 tablet 3   metoprolol  tartrate (LOPRESSOR ) 25 MG tablet TAKE 1 TABLET BY MOUTH TWICE A DAY 180 tablet 3   Multiple Vitamin (MULTIVITAMIN) tablet Take 1 tablet by mouth daily.     omeprazole  (PRILOSEC ) 20 MG capsule Take 1 capsule (20 mg total) by mouth daily. 90 capsule 3   Phenylephrine-Acetaminophen  (TYLENOL  SINUS CONGESTION/PAIN PO) Take 1 capsule by mouth as needed (sinus).     polyethylene glycol (MIRALAX / GLYCOLAX) 17 g packet Take 17 g by mouth daily.     pseudoephedrine (SUDAFED) 120 MG 12 hr tablet Take 120 mg by mouth 2 (two) times daily.     Wheat Dextrin (BENEFIBER DRINK MIX PO) Take 1 Dose by mouth daily.     No facility-administered medications prior to visit.   Allergies  Allergen Reactions   Codeine Nausea And Vomiting and Other (See Comments)    Can't take in large doses   Objective:   Today's Vitals   07/05/24 0901  BP: 138/70  Pulse: (!) 58  Temp: (!) 97 F (36.1 C)   TempSrc: Temporal  SpO2: 97%  Weight: 273 lb 6.4 oz (124 kg)  Height: 6' (1.829 m)   Body mass index is 37.08 kg/m.   General: Well developed, well nourished. No acute distress. HEENT: Normocephalic, non-traumatic. PERRL, EOMI. Conjunctiva clear. External ears normal. EAC and TMs normal   bilaterally. Nose clear without congestion or rhinorrhea. Mucous membranes moist. Oropharynx clear. Good dentition. Neck: Supple. No lymphadenopathy. No thyromegaly. Lungs: Clear to auscultation bilaterally. No wheezing, rales or rhonchi. CV: RRR without murmurs or rubs. Pulses 2+ bilaterally. Abdomen: Soft, non-tender. Moderate diastasis. Bowel sounds positive, normal pitch and frequency. No hepatosplenomegaly.   No rebound or guarding. Rectal: Prostate is moderately enlarged. Smooth and  symmetrical. Extremities: Full ROM. No joint swelling or tenderness. Mild crepitance of knees and   shoulders. No edema noted. Skin: Warm and dry. Multiple lentigo, SKs and AKs. Psych: Alert and oriented. Normal mood and affect.  Health Maintenance Due  Topic Date Due   Zoster Vaccines- Shingrix (2 of 2) 05/24/2011     Assessment & Plan:   Problem List Items Addressed This Visit       Cardiovascular and Mediastinum   Atrial fibrillation with rapid ventricular response (HCC)   Stable. Metoprolol  tartrate 25 mg bid. Continue apixaban  5 mg bid for stroke prevention. Cardiology requests a TSH and CMP before he is seen next  Sept.      Relevant Orders   Comprehensive metabolic panel with GFR   TSH   Essential hypertension   Blood pressure adequately controlled. Continue losartan -HCTZ (Hyzaar) 50-12.5 mg daily.      Relevant Orders   Comprehensive metabolic panel with GFR     Genitourinary   CKD stage 3a, GFR 45-59 ml/min (HCC)   Continue focus on blood pressure, adequate hydration, and avoidance of nephrotoxic medications. Labs today to monitor kidney function and assess for complications.       Relevant Orders   CBC   Phosphorus     Other   Annual physical exam - Primary   Overall health is fair. Recommend regular exercise as much as is tolerated. Discussed recommended screenings and immunizations. Needs zoster #2. Recommend he discuss with his pharmacist.       Hyperlipidemia   I will check lipids today. Continue atorvastatin  20 mg daily.      Relevant Orders   Lipid panel   Lower urinary tract symptoms (LUTS)   Symptoms and exam suggest BPH. I will check a PSA level to assess for possible cancer. I recommend we start tamsulosin  0.4 mg at bedtime.      Relevant Medications   tamsulosin  (FLOMAX ) 0.4 MG CAPS capsule   Other Relevant Orders   PSA, Medicare   Prediabetes   I will check a glucose and A1c for monitoring today.      Relevant Orders   Hemoglobin A1c   Comprehensive metabolic panel with GFR   Other Visit Diagnoses       Need for immunization against influenza       Relevant Orders   Flu vaccine HIGH DOSE PF(Fluzone Trivalent) (Completed)       Return in about 3 months (around 10/04/2024) for Reassessment.   Garnette CHRISTELLA Simpler, MD

## 2024-07-05 NOTE — Assessment & Plan Note (Signed)
 I will check lipids today. Continue atorvastatin 20 mg daily.

## 2024-07-05 NOTE — Assessment & Plan Note (Signed)
I will check a glucose and A1c for monitoring today.

## 2024-07-14 ENCOUNTER — Other Ambulatory Visit: Payer: Self-pay | Admitting: Family Medicine

## 2024-07-19 ENCOUNTER — Other Ambulatory Visit: Payer: Self-pay | Admitting: Family Medicine

## 2024-07-27 ENCOUNTER — Other Ambulatory Visit: Payer: Self-pay

## 2024-07-27 DIAGNOSIS — D225 Melanocytic nevi of trunk: Secondary | ICD-10-CM | POA: Diagnosis not present

## 2024-07-27 DIAGNOSIS — L308 Other specified dermatitis: Secondary | ICD-10-CM | POA: Diagnosis not present

## 2024-07-27 DIAGNOSIS — B078 Other viral warts: Secondary | ICD-10-CM | POA: Diagnosis not present

## 2024-07-27 DIAGNOSIS — Z1283 Encounter for screening for malignant neoplasm of skin: Secondary | ICD-10-CM | POA: Diagnosis not present

## 2024-07-27 MED ORDER — APIXABAN 5 MG PO TABS: 5.0000 mg | ORAL_TABLET | Freq: Two times a day (BID) | ORAL | 1 refills | Status: AC

## 2024-07-27 NOTE — Telephone Encounter (Signed)
 Prescription refill request for Eliquis  received. Indication:AFIB Last office visit:8/25 Scr:1.31  9/25 Age: 78 Weight:124  kg  Prescription refilled

## 2024-07-31 DIAGNOSIS — H5212 Myopia, left eye: Secondary | ICD-10-CM | POA: Diagnosis not present

## 2024-07-31 DIAGNOSIS — H1849 Other corneal degeneration: Secondary | ICD-10-CM | POA: Diagnosis not present

## 2024-07-31 DIAGNOSIS — H53143 Visual discomfort, bilateral: Secondary | ICD-10-CM | POA: Diagnosis not present

## 2024-07-31 DIAGNOSIS — H43393 Other vitreous opacities, bilateral: Secondary | ICD-10-CM | POA: Diagnosis not present

## 2024-07-31 DIAGNOSIS — R519 Headache, unspecified: Secondary | ICD-10-CM | POA: Diagnosis not present

## 2024-07-31 DIAGNOSIS — H52223 Regular astigmatism, bilateral: Secondary | ICD-10-CM | POA: Diagnosis not present

## 2024-07-31 DIAGNOSIS — H5201 Hypermetropia, right eye: Secondary | ICD-10-CM | POA: Diagnosis not present

## 2024-09-29 ENCOUNTER — Other Ambulatory Visit: Payer: Self-pay | Admitting: Family Medicine

## 2024-09-29 DIAGNOSIS — R399 Unspecified symptoms and signs involving the genitourinary system: Secondary | ICD-10-CM

## 2024-10-04 DIAGNOSIS — G4733 Obstructive sleep apnea (adult) (pediatric): Secondary | ICD-10-CM | POA: Diagnosis not present

## 2025-06-03 ENCOUNTER — Ambulatory Visit
